# Patient Record
Sex: Female | Born: 1955 | Hispanic: No | Marital: Married | Smoking: Never smoker
Health system: Southern US, Community
[De-identification: ages and names within clinical notes are randomized; demographics above are authoritative.]

## PROBLEM LIST (undated history)

## (undated) DIAGNOSIS — G4700 Insomnia, unspecified: Secondary | ICD-10-CM

## (undated) DIAGNOSIS — R05 Cough: Secondary | ICD-10-CM

## (undated) DIAGNOSIS — Z8619 Personal history of other infectious and parasitic diseases: Secondary | ICD-10-CM

## (undated) DIAGNOSIS — T7840XA Allergy, unspecified, initial encounter: Secondary | ICD-10-CM

## (undated) DIAGNOSIS — E785 Hyperlipidemia, unspecified: Secondary | ICD-10-CM

## (undated) DIAGNOSIS — M549 Dorsalgia, unspecified: Secondary | ICD-10-CM

## (undated) DIAGNOSIS — K089 Disorder of teeth and supporting structures, unspecified: Secondary | ICD-10-CM

## (undated) DIAGNOSIS — I10 Essential (primary) hypertension: Secondary | ICD-10-CM

## (undated) DIAGNOSIS — J45909 Unspecified asthma, uncomplicated: Secondary | ICD-10-CM

## (undated) DIAGNOSIS — Z6831 Body mass index (BMI) 31.0-31.9, adult: Secondary | ICD-10-CM

## (undated) DIAGNOSIS — J0100 Acute maxillary sinusitis, unspecified: Secondary | ICD-10-CM

## (undated) DIAGNOSIS — E669 Obesity, unspecified: Secondary | ICD-10-CM

## (undated) DIAGNOSIS — M542 Cervicalgia: Secondary | ICD-10-CM

## (undated) DIAGNOSIS — K219 Gastro-esophageal reflux disease without esophagitis: Secondary | ICD-10-CM

## (undated) DIAGNOSIS — F411 Generalized anxiety disorder: Secondary | ICD-10-CM

## (undated) DIAGNOSIS — J309 Allergic rhinitis, unspecified: Secondary | ICD-10-CM

## (undated) DIAGNOSIS — N8189 Other female genital prolapse: Secondary | ICD-10-CM

## (undated) DIAGNOSIS — Z87898 Personal history of other specified conditions: Secondary | ICD-10-CM

## (undated) HISTORY — DX: Dorsalgia, unspecified: M54.9

## (undated) HISTORY — DX: Personal history of other specified conditions: Z87.898

## (undated) HISTORY — DX: Obesity, unspecified: E66.9

## (undated) HISTORY — DX: Acute maxillary sinusitis, unspecified: J01.00

## (undated) HISTORY — DX: Allergic rhinitis, unspecified: J30.9

## (undated) HISTORY — PX: TUBAL LIGATION: SHX77

## (undated) HISTORY — DX: Insomnia, unspecified: G47.00

## (undated) HISTORY — DX: Unspecified asthma, uncomplicated: J45.909

## (undated) HISTORY — DX: Other female genital prolapse: N81.89

## (undated) HISTORY — DX: Allergy, unspecified, initial encounter: T78.40XA

## (undated) HISTORY — DX: Personal history of other infectious and parasitic diseases: Z86.19

## (undated) HISTORY — DX: Essential (primary) hypertension: I10

## (undated) HISTORY — DX: Gastro-esophageal reflux disease without esophagitis: K21.9

## (undated) HISTORY — DX: Hyperlipidemia, unspecified: E78.5

## (undated) HISTORY — DX: Body mass index (bmi) 31.0-31.9, adult: Z68.31

## (undated) HISTORY — DX: Cough: R05

## (undated) HISTORY — PX: REPLACEMENT TOTAL KNEE: SUR1224

## (undated) HISTORY — DX: Cervicalgia: M54.2

## (undated) HISTORY — PX: VAGINAL HYSTERECTOMY: SUR661

## (undated) HISTORY — DX: Disorder of teeth and supporting structures, unspecified: K08.9

## (undated) HISTORY — DX: Generalized anxiety disorder: F41.1

---

## 1997-11-19 ENCOUNTER — Other Ambulatory Visit: Admission: RE | Admit: 1997-11-19 | Discharge: 1997-11-19 | Payer: Self-pay | Admitting: Obstetrics and Gynecology

## 1999-03-05 ENCOUNTER — Encounter: Payer: Self-pay | Admitting: Obstetrics and Gynecology

## 1999-03-05 ENCOUNTER — Ambulatory Visit (HOSPITAL_COMMUNITY): Admission: RE | Admit: 1999-03-05 | Discharge: 1999-03-05 | Payer: Self-pay | Admitting: Obstetrics and Gynecology

## 1999-04-01 ENCOUNTER — Other Ambulatory Visit: Admission: RE | Admit: 1999-04-01 | Discharge: 1999-04-01 | Payer: Self-pay | Admitting: Obstetrics and Gynecology

## 1999-05-21 ENCOUNTER — Encounter (INDEPENDENT_AMBULATORY_CARE_PROVIDER_SITE_OTHER): Payer: Self-pay | Admitting: Specialist

## 1999-05-22 ENCOUNTER — Inpatient Hospital Stay (HOSPITAL_COMMUNITY): Admission: AD | Admit: 1999-05-22 | Discharge: 1999-05-23 | Payer: Self-pay | Admitting: Obstetrics and Gynecology

## 2001-03-15 ENCOUNTER — Ambulatory Visit (HOSPITAL_COMMUNITY): Admission: RE | Admit: 2001-03-15 | Discharge: 2001-03-15 | Payer: Self-pay | Admitting: Obstetrics and Gynecology

## 2001-03-15 ENCOUNTER — Encounter: Payer: Self-pay | Admitting: Obstetrics and Gynecology

## 2002-05-09 ENCOUNTER — Encounter: Payer: Self-pay | Admitting: Obstetrics and Gynecology

## 2002-05-09 ENCOUNTER — Ambulatory Visit (HOSPITAL_COMMUNITY): Admission: RE | Admit: 2002-05-09 | Discharge: 2002-05-09 | Payer: Self-pay | Admitting: Obstetrics and Gynecology

## 2002-05-17 ENCOUNTER — Other Ambulatory Visit: Admission: RE | Admit: 2002-05-17 | Discharge: 2002-05-17 | Payer: Self-pay | Admitting: Obstetrics and Gynecology

## 2005-04-07 ENCOUNTER — Other Ambulatory Visit: Admission: RE | Admit: 2005-04-07 | Discharge: 2005-04-07 | Payer: Self-pay | Admitting: Obstetrics and Gynecology

## 2006-05-31 ENCOUNTER — Ambulatory Visit: Payer: Self-pay | Admitting: Internal Medicine

## 2006-06-27 ENCOUNTER — Ambulatory Visit: Payer: Self-pay | Admitting: Internal Medicine

## 2007-10-24 ENCOUNTER — Ambulatory Visit (HOSPITAL_COMMUNITY): Admission: RE | Admit: 2007-10-24 | Discharge: 2007-10-24 | Payer: Self-pay | Admitting: Obstetrics and Gynecology

## 2009-04-15 ENCOUNTER — Ambulatory Visit (HOSPITAL_COMMUNITY): Admission: RE | Admit: 2009-04-15 | Discharge: 2009-04-15 | Payer: Self-pay | Admitting: Obstetrics and Gynecology

## 2009-11-17 ENCOUNTER — Ambulatory Visit (HOSPITAL_COMMUNITY): Admission: RE | Admit: 2009-11-17 | Discharge: 2009-11-17 | Payer: Self-pay | Admitting: Gastroenterology

## 2010-03-08 ENCOUNTER — Encounter: Payer: Self-pay | Admitting: Internal Medicine

## 2010-05-18 ENCOUNTER — Other Ambulatory Visit: Payer: Self-pay | Admitting: Internal Medicine

## 2010-05-18 DIAGNOSIS — R519 Headache, unspecified: Secondary | ICD-10-CM

## 2010-05-27 ENCOUNTER — Ambulatory Visit
Admission: RE | Admit: 2010-05-27 | Discharge: 2010-05-27 | Disposition: A | Discharge status: Home / Self Care | Payer: Federal, State, Local not specified - PPO | Source: Ambulatory Visit | Attending: Internal Medicine | Admitting: Internal Medicine

## 2010-05-27 DIAGNOSIS — R519 Headache, unspecified: Secondary | ICD-10-CM

## 2010-06-03 ENCOUNTER — Other Ambulatory Visit: Payer: Self-pay | Admitting: Internal Medicine

## 2010-06-03 DIAGNOSIS — I6521 Occlusion and stenosis of right carotid artery: Secondary | ICD-10-CM

## 2010-06-03 DIAGNOSIS — R51 Headache: Secondary | ICD-10-CM

## 2010-06-04 ENCOUNTER — Ambulatory Visit
Admission: RE | Admit: 2010-06-04 | Discharge: 2010-06-04 | Disposition: A | Discharge status: Home / Self Care | Payer: Federal, State, Local not specified - PPO | Source: Ambulatory Visit | Attending: Internal Medicine | Admitting: Internal Medicine

## 2010-06-04 DIAGNOSIS — I6521 Occlusion and stenosis of right carotid artery: Secondary | ICD-10-CM

## 2010-07-03 NOTE — Discharge Summary (Signed)
[facility]  Patient:    Brooke Wilson, Brooke Wilson                   MRN: [id number] Adm. Date:  [id number] Disc. Date: 05/22/99 Attending:  Shaune Spittle Dictator:   Henreitta Leber, P.A.                           Discharge Summary  DISCHARGE DIAGNOSES:          1. Menorrhagia.                               2. Urinary stress incontinence.                               3. Pelvic pain.  PROCEDURE:                    On date of admission the patient underwent a total vaginal hysterectomy with posterior repair along with excision of a left medial  thigh lesion and excision of two hyperpigmented perineal lesions, and a left ovarian cystectomy.  HISTORY OF PRESENT ILLNESS:   This is a 55 year old married female, gravida 4, ara 3-0-1-3, who is S/P BTL with a history of hypertension admitted for total vaginal hysterectomy and posterior repair due to a longstanding history of menorrhagia,  urinary stress incontinence, and chronic constipation.  Please see the patients  dictated history and physical examination for details.  PHYSICAL EXAMINATION:         VITAL SIGNS: Blood pressure 120/80, weight 164, height 4 feet 11-1/2 inches tall.  GENERAL: Within normal limits.  PELVIC: EGBUS within normal limits.  PELVIC: Cervix revealed second degree descensus.  Vagina was rugous.  The uterus was enlarged and difficult to assess secondary to body habitus. Adnexa was without tenderness or masses.  Rectovaginal was without tenderness or masses.  HOSPITAL COURSE:              On date of admission, the patient underwent a total vaginal hysterectomy with a posterior repair along with excision of perineal and medial thigh lesions tolerating all procedures well.  Vaginal packing was placed postoperatively and removed on postoperative day #1, however, the patient experienced bleeding in which she soaked a pad in two hours at which time packing was replaced.   Otherwise, postoperative course was uneventful.  Postoperative hemoglobin was 11.6 (preop hemoglobin 13.1).  Having received the maximum benefit of hospital stay, the patient was discharged to home on postoperative day #1.  DISCHARGE MEDICATIONS:        1. Ibuprofen 800 mg one tablet every six hours with                                  food for pain.                               2. Phenergan 25 mg one tablet every six hours as                                  needed for pain.  3. Ferrous sulfate 325 mg one tablet twice daily                                  for six weeks.                               4. Tylox one to two tablets every four to six                                  hours as needed for pain.                               5. Zyrtec 10 mg one tablet daily.                               6. Flonase two puffs twice daily.                               7. Flovent two puffs twice daily.                               8. Albuterol inhaler two puffs four times daily                                  as needed.                               9. Norvasc 5 mg one tablet daily.  DISCHARGE INSTRUCTIONS:       The patient was given a copy of [facility] postoperative instruction sheet.  She was also encouraged to call for questions or concerns.  The patient further was advised to abstain rom driving for two weeks, heavy lifting for four weeks, and intercourse for six weeks.  FOLLOW-UP:                    The patient is to return to [facility] on May 23, 1999, at 9 a.m. for vaginal packing removal.  She is to call [facility] for a six weeks postoperative examination with Erie Noe P. Pennie Rushing, M.D.  The patients final pathology was not  available at the time of discharge. DD:  05/22/99 TD:  05/22/99 Job: [id number]

## 2010-07-03 NOTE — Op Note (Signed)
[facility]  Patient:    Brooke Wilson, Brooke Wilson                   MRN: [id number] Proc. Date: 05/21/99 Adm. Date:  [id number] Attending:  Dierdre Forth Pearline                           Operative Report  PREOPERATIVE DIAGNOSES:       Menometrorrhagia, pelvic relaxation, left inner thigh lesion.  POSTOPERATIVE DIAGNOSES:      Menometrorrhagia, pelvic relaxation, left inner thigh lesion plus pigmented vulvar lesions, left ovarian cyst ruptured during procedure.  OPERATION:                    Total vaginal hysterectomy, excision of left inner thigh lesion, posterior repair, excision of pigmented lesions of the vulva, excision of left ovarian cyst wall.  ANESTHESIA:                   General orotracheal.  ESTIMATED BLOOD LOSS:         150 cc.  COMPLICATIONS:                None.  SURGEON:                      Vanessa P. Pennie Rushing, M.D.  FIRST ASSISTANT:              Henreitta Leber, P.A.  FINDINGS:                     The uterus was upper limits of normal size.  The right ovary was within normal limits.  The left ovary contained a 3-cm cyst which ruptured during the time of hysterectomy and the cyst wall was excised and oversewn.  There were two 2-mm pigmented vulvar lesions on the right posterior fourchette of the vulva and there was a 5-mm left inner thigh subcutaneous lesion consistent with a lipoma.  DESCRIPTION OF PROCEDURE:     The patient was taken to the operating room after  appropriate identification and placed on the operating table.  After the attainment of adequate general anesthesia, she was placed in the lithotomy position.  The perineum and vagina were prepped with multiple layers of Betadine and a Foley catheter inserted into the bladder under sterile conditions and connected to straight drainage.  The perineum was draped as a sterile field.  The left inner  thigh lesion was infiltrated with approximately 3 cc of 0.25%  Marcaine and an elliptical incision made surrounding the lesion.  It was then sharply dissected  from the underlying subcutaneous tissue and removed from the operative field. he defect was closed with deep mattress sutures of 3-0 Vicryl and a subcuticular suture of 3-0 Vicryl was placed.  Steri-Strips were placed over the incision to  maintain the approximation of the incisional edges.  A weighted speculum was then placed in the posterior vagina and Lahey tenacula placed on the anterior and posterior lips of the cervix.  The cervicovaginal mucosa was injected with a dilute solution of Pitressin and the cervicovaginal junction incised circumferentially. The anterior vaginal mucosa was dissected off the anterior cervix and the anterior peritoneum entered.  The posterior peritoneum was entered and held.  The uterosacral ligaments on the right and left sides were clamped, cut and suture-ligated and those sutures held.  The paracervical tissues were then clamped, cut and suture-ligated.  The uterus was inverted and the upper pedicles including the upper broad ligament, tube and uteroovarian ligaments were clamped and cut nd the uterus removed from the operative field.  These were then secured with free  ties, then suture ligatures and held.  Hemostatic sutures were placed in the angles of the vagina and hemostasis noted to be adequate.  A McCall culdoplasty suture was then placed in the posterior cul-de-sac, incorporating the uterosacral ligaments and the intervening peritoneum.  Two of these sutures were placed and then held. The vaginal angles were created with the uterosacral ligament sutures which had  been held on the anterior and posterior vaginal surfaces and tied down.  The remainder of the vaginal cuff was closed with figure-of-eight sutures which incorporated the anterior vaginal mucosa, anterior peritoneum, posterior peritoneum and posterior vaginal mucosa.  Once these  had been tied down, hemostasis was noted to be adequate and the [facility] culdoplasty sutures were tied down.  The weighted  speculum was then removed and the posterior repair begun by incising the peritoneum and excising the aforementioned pigmented vulvar lesions on the right side of the posterior perineum.  The posterior vaginal mucosa was incised in the midline and the vaginal mucosa dissected off the underlying fascia.  Once this had been achieved, the levators were incorporated in mattress sutures of 0 chromic to reapproximate them in the midline as these sutures were tied down.  The redundant vaginal mucosa was excised and the vaginal mucosa closed with a running interlocking suture, which at the perineum incorporated the perineal body.  A subcuticular suture of 2-0 chromic was used to close the skin on the perineum and hemostasis was noted to be adequate.  A two-inch packing with Estrace cream was  then placed in the vagina, once hemostasis was noted to be adequate and all instruments had been removed from the vagina.  The patient was then awakened from general anesthesia and taken to the recovery room in satisfactory condition, having tolerated the procedure well, with sponge and instrument counts correct.  SPECIMENS TO PATHOLOGY:       1. Uterus and cervix.                               2. Left ovarian cyst wall.                               3. Left inner thigh lesion.                               4. Right perineal pigmented lesion. DD:  05/21/99 TD:  05/21/99 Job: [id number]

## 2011-03-22 ENCOUNTER — Ambulatory Visit (INDEPENDENT_AMBULATORY_CARE_PROVIDER_SITE_OTHER): Payer: Federal, State, Local not specified - PPO | Admitting: Internal Medicine

## 2011-03-22 VITALS — BP 118/77 | HR 90 | Temp 97.9°F | Resp 18 | Ht 59.0 in | Wt 154.0 lb

## 2011-03-22 DIAGNOSIS — E785 Hyperlipidemia, unspecified: Secondary | ICD-10-CM

## 2011-03-22 DIAGNOSIS — I10 Essential (primary) hypertension: Secondary | ICD-10-CM

## 2011-03-22 DIAGNOSIS — R05 Cough: Secondary | ICD-10-CM

## 2011-03-22 DIAGNOSIS — Z6831 Body mass index (BMI) 31.0-31.9, adult: Secondary | ICD-10-CM

## 2011-03-22 DIAGNOSIS — R059 Cough, unspecified: Secondary | ICD-10-CM

## 2011-03-22 MED ORDER — NIFEDIPINE ER OSMOTIC RELEASE 30 MG PO TB24: 30.0000 mg | ORAL_TABLET | Freq: Every day | ORAL | Status: DC

## 2011-03-22 MED ORDER — HYDROCHLOROTHIAZIDE 25 MG PO TABS: 25.0000 mg | ORAL_TABLET | Freq: Every day | ORAL | Status: DC

## 2011-03-23 DIAGNOSIS — E785 Hyperlipidemia, unspecified: Secondary | ICD-10-CM | POA: Insufficient documentation

## 2011-03-23 DIAGNOSIS — Z6831 Body mass index (BMI) 31.0-31.9, adult: Secondary | ICD-10-CM | POA: Insufficient documentation

## 2011-03-23 DIAGNOSIS — I10 Essential (primary) hypertension: Secondary | ICD-10-CM

## 2011-03-23 DIAGNOSIS — R059 Cough, unspecified: Secondary | ICD-10-CM | POA: Insufficient documentation

## 2011-03-23 DIAGNOSIS — R05 Cough: Secondary | ICD-10-CM | POA: Insufficient documentation

## 2011-03-23 DIAGNOSIS — I1A0 Resistant hypertension: Secondary | ICD-10-CM | POA: Insufficient documentation

## 2011-03-23 DIAGNOSIS — T7840XA Allergy, unspecified, initial encounter: Secondary | ICD-10-CM | POA: Insufficient documentation

## 2011-03-23 HISTORY — DX: Cough, unspecified: R05.9

## 2011-03-23 HISTORY — DX: Body mass index (BMI) 31.0-31.9, adult: Z68.31

## 2011-03-23 HISTORY — DX: Resistant hypertension: I1A.0

## 2011-03-23 HISTORY — DX: Hyperlipidemia, unspecified: E78.5

## 2011-03-23 HISTORY — DX: Essential (primary) hypertension: I10

## 2011-03-23 NOTE — Progress Notes (Signed)
  Subjective:    Patient ID: Brooke Wilson, female    DOB: 1955-11-16, 56 y.o.   MRN: [id number]  HPIbelieving her Tenormin or rather Tenoretic to be the cause of her chronic cough, she discontinued this medicine one and a half weeks ago and feels better on ready. She had a cough response to lisinopril. She also has frequent allergies and perhaps some reflux and often has a cough anyway. Past workup has been inconclusive. She has essential hypertension but being overweight contributes to this.  She is currently not experiencing any problems with the Crestor.  She relates several questions about taking vitamin D. She does poorly with daily medication and would prefer to take 50,000 units once a week, however her vitamin D level is almost normal and this would be excessive. I reminded her that her DEXA scan was normal 4 years ago and that" current guidelines she doesn't need extra vitamin D    Review of Systemsnoncontributory today     Objective:   Physical ExamHEENT within normal limits  Cardiovascular within normal limits Lungs are clear There is no peripheral edema        Assessment & Plan:  Problem #1 chronic cough  Problem #2 hypertension  Problem #3 hyperlipidemia   According to her wishes we will replace Tenoretic with nifedipine and hydrochlorothiazide. She will monitor home blood pressures and followup in April 4 physical examination with lab work including remeasurement of her vitamin D.

## 2011-05-17 ENCOUNTER — Ambulatory Visit (INDEPENDENT_AMBULATORY_CARE_PROVIDER_SITE_OTHER): Payer: Federal, State, Local not specified - PPO | Admitting: Internal Medicine

## 2011-05-17 VITALS — BP 107/68 | HR 91 | Temp 98.1°F | Resp 16 | Ht 59.5 in | Wt 148.0 lb

## 2011-05-17 DIAGNOSIS — G4700 Insomnia, unspecified: Secondary | ICD-10-CM | POA: Insufficient documentation

## 2011-05-17 DIAGNOSIS — Z0000 Encounter for general adult medical examination without abnormal findings: Secondary | ICD-10-CM

## 2011-05-17 DIAGNOSIS — F411 Generalized anxiety disorder: Secondary | ICD-10-CM

## 2011-05-17 DIAGNOSIS — K219 Gastro-esophageal reflux disease without esophagitis: Secondary | ICD-10-CM

## 2011-05-17 DIAGNOSIS — I10 Essential (primary) hypertension: Secondary | ICD-10-CM

## 2011-05-17 DIAGNOSIS — Z6831 Body mass index (BMI) 31.0-31.9, adult: Secondary | ICD-10-CM

## 2011-05-17 DIAGNOSIS — E785 Hyperlipidemia, unspecified: Secondary | ICD-10-CM

## 2011-05-17 HISTORY — DX: Generalized anxiety disorder: F41.1

## 2011-05-17 HISTORY — DX: Gastro-esophageal reflux disease without esophagitis: K21.9

## 2011-05-17 HISTORY — DX: Insomnia, unspecified: G47.00

## 2011-05-17 LAB — LIPID PANEL
HDL: 49 mg/dL (ref >39)
LDL Cholesterol: 63 mg/dL (ref 0–99)
Total CHOL/HDL Ratio: 2.9 Ratio
Triglycerides: 153 mg/dL — ABNORMAL HIGH (ref <150)
VLDL: 31 mg/dL (ref 0–40)

## 2011-05-17 LAB — POCT CBC
HCT, POC: 38.3 % (ref 37.7–47.9)
Lymph, poc: 2 (ref 0.6–3.4)
MCHC: 31.3 g/dL — AB (ref 31.8–35.4)
POC Granulocyte: 4.3 (ref 2–6.9)
POC LYMPH PERCENT: 30.3 %L (ref 10–50)
POC MID %: 5.5 %M (ref 0–12)
RDW, POC: 14.3 %

## 2011-05-17 LAB — COMPREHENSIVE METABOLIC PANEL
Alkaline Phosphatase: 96 U/L (ref 39–117)
Glucose, Bld: 113 mg/dL — ABNORMAL HIGH (ref 70–99)
Sodium: 141 mEq/L (ref 135–145)
Total Bilirubin: 0.3 mg/dL (ref 0.3–1.2)
Total Protein: 7.2 g/dL (ref 6.0–8.3)

## 2011-05-17 LAB — POCT URINALYSIS DIPSTICK
Ketones, UA: NEGATIVE
Protein, UA: NEGATIVE
Spec Grav, UA: 1.015
pH, UA: 6

## 2011-05-17 MED ORDER — DIAZEPAM 10 MG PO TABS: 10.0000 mg | ORAL_TABLET | Freq: Three times a day (TID) | ORAL | Status: AC | PRN

## 2011-05-17 MED ORDER — DILTIAZEM HCL ER 120 MG PO CP24: 120.0000 mg | ORAL_CAPSULE | Freq: Every day | ORAL | Status: DC

## 2011-05-17 MED ORDER — TEMAZEPAM 30 MG PO CAPS: 30.0000 mg | ORAL_CAPSULE | Freq: Every evening | ORAL | Status: DC | PRN

## 2011-05-17 MED ORDER — DILTIAZEM HCL ER 120 MG PO CP12: 120.0000 mg | ORAL_CAPSULE | Freq: Two times a day (BID) | ORAL | Status: DC

## 2011-05-17 NOTE — Progress Notes (Signed)
Subjective:    Patient ID: Brooke Wilson, female    DOB: 1955-04-09, 56 y.o.   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 Essential hypertension, benign  . Cough  . Hyperlipemia  . BMI 31.0-31.9,adult  . GERD (gastroesophageal reflux disease)  . Anxiety state, unspecified  . Insomnia, unspecified  Her problems are all stable at this point She blames nifedipine for causing a cough/she uses this when necessary for palpitations and possibly for hypertension though it is unclear that she takes it on a regular basis/she would like to switch to Cardiazem Although she has in the past been on inhalers she says that she has no asthma She has been taking 50,000 units of vitamin D weekly and this will be rechecked She continues to need temazepam for sleep and when necessary Valium for anxiety She has not been able to lose any weight  Her colonoscopy with Dr. Loreta Ave in 2011 was normal Dr. Pennie Rushing is her gynecologist She had been advised to take Crestor for hyperlipidemia/we will recheck this as she is no longer taking this medicine She uses DEXA land on a when necessary basis      Review of Systems  Constitutional: Negative.   HENT: Negative.   Eyes: Negative.   Respiratory: Negative.   Cardiovascular: Negative.   Gastrointestinal: Negative.   Genitourinary: Negative.   Musculoskeletal: Negative.   Skin: Negative.   Neurological: Negative.   Hematological: Negative.   Psychiatric/Behavioral: Negative for suicidal ideas, behavioral problems, confusion, dysphoric mood and agitation.       Objective:   Physical Exam  Constitutional: She is oriented  to person, place, and time. She appears well-developed and well-nourished.  HENT:  Head: Normocephalic.  Right Ear: External ear normal.  Left Ear: External ear normal.  Nose: Nose normal.  Eyes: Conjunctivae and EOM are normal. Pupils are equal, round, and reactive to light.  Neck: Normal range of motion. Neck supple. No thyromegaly present.  Cardiovascular: Regular rhythm, normal heart sounds and intact distal pulses.        Note that she had a carotid Doppler study in 2012 that identified no significant stenosis  Pulmonary/Chest: Effort normal and breath sounds normal.  Abdominal: Soft. Bowel sounds are normal.  Musculoskeletal: Normal range of motion. She exhibits no edema.  Neurological: She is alert and oriented to person, place, and time. She has normal reflexes.  Skin: Skin is warm and dry. No rash noted.  Psychiatric: She has a normal mood and affect. Her behavior is normal. Judgment and thought content normal.      Results for orders placed in visit on 05/17/11  POCT CBC      Component Value Range   WBC 6.7  4.6 - 10.2 (K/uL)   Lymph, poc 2.0  0.6 - 3.4    POC LYMPH PERCENT 30.3  10 - 50 (%L)   MID (cbc) 0.4  0 - 0.9    POC MID % 5.5  0 - 12 (%M)   POC Granulocyte 4.3  2 - 6.9    Granulocyte percent 64.2  37 - 80 (%G)   RBC 4.50  4.04 - 5.48 (M/uL)   Hemoglobin 12.0 (*)  12.2 - 16.2 (g/dL)   HCT, POC 40.9  81.1 - 47.9 (%)   MCV 85.2  80 - 97 (fL)   MCH, POC 26.7 (*) 27 - 31.2 (pg)   MCHC 31.3 (*) 31.8 - 35.4 (g/dL)   RDW, POC 91.4     Platelet Count, POC 814 (*) 142 - 424 (K/uL)   MPV 8.6  0 - 99.8 (fL)  POCT GLYCOSYLATED HEMOGLOBIN (HGB A1C)      Component Value Range   Hemoglobin A1C 5.7    POCT URINALYSIS DIPSTICK      Component Value Range   Color, UA YELLOW     Clarity, UA CLEAR     Glucose, UA NEG     Bilirubin, UA NEG     Ketones, UA NEG     Spec Grav, UA 1.015     Blood, UA MOD     pH, UA 6.0     Protein, UA NEG     Urobilinogen, UA 0.2      Nitrite, UA NEG     Leukocytes, UA Negative         Assessment & Plan:  Problem #1 annual physical examination Patient Active Problem List  Diagnoses  . Essential hypertension, benign  . Cough  . Hyperlipemia  . BMI 31.0-31.9,adult  . GERD (gastroesophageal reflux disease)  . Anxiety state, unspecified  . Insomnia, unspecified   Six-month refills for Valium and temazepam Change to Diltia is 120 Hydrochlorothiazide was refilled in February for one year I will contact her with her team labs

## 2011-05-22 ENCOUNTER — Encounter: Payer: Self-pay | Admitting: Internal Medicine

## 2011-06-05 ENCOUNTER — Telehealth: Payer: Self-pay

## 2011-06-05 NOTE — Telephone Encounter (Signed)
Called in RXs from the last OV with [facility] [address]. Called pt no to let her know vm not setup

## 2011-06-05 NOTE — Telephone Encounter (Signed)
Called again to notify pt, no answer

## 2011-06-05 NOTE — Telephone Encounter (Signed)
PT WAS SEEN THE FIRST OF April BY DR DOOLITTLE. STATES [facility] [address], WENT TO PICK UP AND NOTHING SENT PLEASE CALL PT TO ADVISE

## 2011-06-06 NOTE — Telephone Encounter (Signed)
Called pt phone answered but then hung up

## 2011-06-08 ENCOUNTER — Other Ambulatory Visit: Payer: Self-pay

## 2011-06-12 ENCOUNTER — Telehealth: Payer: Self-pay | Admitting: Family Medicine

## 2011-06-12 ENCOUNTER — Other Ambulatory Visit: Payer: Self-pay | Admitting: Family Medicine

## 2011-06-12 MED ORDER — ROSUVASTATIN CALCIUM 20 MG PO TABS: 20.0000 mg | ORAL_TABLET | Freq: Every day | ORAL | Status: DC

## 2011-06-12 NOTE — Telephone Encounter (Signed)
Rx changed to #90 1 refill due to patient request/insurance.

## 2011-08-17 ENCOUNTER — Telehealth: Payer: Self-pay | Admitting: Obstetrics and Gynecology

## 2011-08-25 ENCOUNTER — Other Ambulatory Visit: Payer: Self-pay | Admitting: Obstetrics and Gynecology

## 2011-08-25 DIAGNOSIS — Z1231 Encounter for screening mammogram for malignant neoplasm of breast: Secondary | ICD-10-CM

## 2011-09-21 ENCOUNTER — Ambulatory Visit (INDEPENDENT_AMBULATORY_CARE_PROVIDER_SITE_OTHER): Payer: Federal, State, Local not specified - PPO | Admitting: Obstetrics and Gynecology

## 2011-09-21 ENCOUNTER — Encounter: Payer: Self-pay | Admitting: Obstetrics and Gynecology

## 2011-09-21 VITALS — Ht 59.0 in | Wt 152.0 lb

## 2011-09-21 DIAGNOSIS — N9089 Other specified noninflammatory disorders of vulva and perineum: Secondary | ICD-10-CM

## 2011-09-21 DIAGNOSIS — Z124 Encounter for screening for malignant neoplasm of cervix: Secondary | ICD-10-CM

## 2011-09-21 NOTE — Progress Notes (Signed)
AEX( last visit 2009)  Last Pap: 2012 WNL: Yes wnl per pt (?facility) Regular Periods:no Contraception: hyst  Monthly Breast exam:yes Tetanus<44yrs:yes Nl.Bladder Function:yes Daily BMs:yes Healthy Diet:no Calcium:no Mammogram:yes Date of Mammogram: 09/18/2011 @[facility] -unknown results Exercise:no Have often Exercise:n/a Seatbelt: yes Abuse at home: no Stressful work:no Sigmoid-colonoscopy: 2012-wnl per pt Dr.Mann Bone Density: Yes 37yrs ago-wnl per pt PCP: Dr.Reade Dr. Merla Riches Change in PMH: no change  Change in FMH:no change Subjective:    Zykerria Tanton is a 56 y.o. female [phone] who presents for annual exam.  The patient has no complaints today.   The following portions of the patient's history were reviewed and updated as appropriate: allergies, current medications, past family history, past medical history, past social history, past surgical history and problem list.  Review of Systems Pertinent items are noted in HPI. Gastrointestinal:No change in bowel habits, no abdominal pain, no rectal bleeding Genitourinary:negative for dysuria, frequency, hematuria, nocturia and urinary incontinence    Objective:     Ht 4\' 11"  (1.499 m)  Wt 152 lb (68.947 kg)  BMI 30.70 kg/m2  Weight:  Wt Readings from Last 1 Encounters:  09/21/11 152 lb (68.947 kg)     BMI: Body mass index is 30.70 kg/(m^2). General Appearance: Alert, appropriate appearance for age. No acute distress HEENT: Grossly normal Neck / Thyroid: Supple, no masses, nodes or enlargement Lungs: clear to auscultation bilaterally Back: No CVA tenderness Breast Exam: No masses or nodes.No dimpling, nipple retraction or discharge. Cardiovascular: Regular rate and rhythm. S1, S2, no murmur Gastrointestinal: Soft, non-tender, no masses or organomegaly Pelvic Exam: External genitalia: multiple hyperpimented areas.  On right labia, lesions are raised. At posterior fourchette they are fflat Vaginal: normal mucosa  without prolapse or lesions and vaginal vault, well healed and suspended Cervix: removed surgically Adnexa: non palpable Uterus: removed surgically Rectovaginal: normal rectal, no masses Lymphatic Exam: Non-palpable nodes in neck, clavicular, axillary, or inguinal regions Skin: no rash or abnormalities Neurologic: Normal gait and speech, no tremor  Psychiatric: Alert and oriented, appropriate affect.    Urinalysis:Not done      Assessment:    Pigmented vulvar lesion    Plan:    Biopsy recommended and accepted   Follow-up:  For bx  .

## 2011-09-23 ENCOUNTER — Encounter: Payer: Self-pay | Admitting: Obstetrics and Gynecology

## 2011-09-24 ENCOUNTER — Encounter: Payer: Self-pay | Admitting: Obstetrics and Gynecology

## 2011-09-24 ENCOUNTER — Ambulatory Visit (INDEPENDENT_AMBULATORY_CARE_PROVIDER_SITE_OTHER): Payer: Federal, State, Local not specified - PPO | Admitting: Obstetrics and Gynecology

## 2011-09-24 VITALS — BP 118/80 | Resp 14 | Ht 59.0 in | Wt 149.0 lb

## 2011-09-24 DIAGNOSIS — N9089 Other specified noninflammatory disorders of vulva and perineum: Secondary | ICD-10-CM

## 2011-09-24 NOTE — Progress Notes (Signed)
Patient ID: Brooke Wilson, female   DOB: 1955/08/31, 56 y.o.   MRN: [id number] Pt is here today for vulvar biopsy. Consent signed and witnessed per protocol. JO  BP 118/80  Resp 14  Ht 4\' 11"  (1.499 m)  Wt 149 lb (67.586 kg)  BMI 30.09 kg/m2  Procedure: Posterior fourchette cleaned with Betadine. Infiltrated with 1cc of 1% lidocaine Excisional biopsy of pigmented posterior fourchette lesion of 3 mm by shaving. Silver nitrate to base of lesion with good hemostasis  Assessment: Pigmented vulvar lesion with sites surrounding vulva.  Recommendation: OTC triple antibiotic ointment to bx site Dilutional squirt bottle to be used during urination Will notify pt of pathology  By phone with followup determined at that time

## 2011-09-29 ENCOUNTER — Telehealth: Payer: Self-pay

## 2011-09-29 NOTE — Telephone Encounter (Signed)
Tc from pt per telephone call. Told pt no evidence of any cancer, just inflammation found on vulvar biopsy results. No treatment required. Pt voices understanding.

## 2011-09-29 NOTE — Telephone Encounter (Signed)
Tc to pt per test results. No answer and unable to leave a vm.

## 2011-10-07 ENCOUNTER — Encounter: Payer: Federal, State, Local not specified - PPO | Admitting: Obstetrics and Gynecology

## 2012-01-04 ENCOUNTER — Telehealth: Payer: Self-pay

## 2012-01-04 NOTE — Telephone Encounter (Signed)
Request given to xray 

## 2012-01-04 NOTE — Telephone Encounter (Signed)
PT WOULD LIKE TO COME BY AND P/U A COPY OF HER XRAY. PLEASE CALL [phone] WHEN READY

## 2012-01-09 ENCOUNTER — Other Ambulatory Visit: Payer: Self-pay | Admitting: Internal Medicine

## 2012-01-11 NOTE — Telephone Encounter (Signed)
Pt wants to have latest chest x-ray reviewed and wants to know if there are any signs of tuberculosis.   [phone]

## 2012-01-14 NOTE — Progress Notes (Signed)
Completed prior auth over the phone for pt's temazepam 30 mg and received approval through 01/13/13. Faxed approval notice to pharmacy.

## 2012-01-15 ENCOUNTER — Telehealth: Payer: Self-pay | Admitting: *Deleted

## 2012-01-15 NOTE — Telephone Encounter (Signed)
Buena Irish 01/11/2012 12:54 PM Signed  Pt wants to have latest chest x-ray reviewed and wants to know if there are any signs of tuberculosis.  [phone]

## 2012-01-16 NOTE — Telephone Encounter (Signed)
Note was in rx requests.    No answer/no vm. Need to get more information

## 2012-01-16 NOTE — Telephone Encounter (Signed)
Patient would like a refill on valium and restoril.

## 2012-01-16 NOTE — Telephone Encounter (Signed)
We will need her chart to see her last chest xray- I would just get her to pick up a copy - my guess is she needs to be screened for TB for work.  Once you get a copy of her CXR up front please route to Dr. Merla Riches for him to review her med request.

## 2012-01-16 NOTE — Telephone Encounter (Signed)
Patient would like a refill on her Valium and restoril.  Pharmacy should have sent requests.

## 2012-02-11 ENCOUNTER — Telehealth: Payer: Self-pay

## 2012-02-11 MED ORDER — ALBUTEROL SULFATE HFA 108 (90 BASE) MCG/ACT IN AERS: 2.0000 | INHALATION_SPRAY | Freq: Four times a day (QID) | RESPIRATORY_TRACT | Status: DC | PRN

## 2012-02-11 MED ORDER — FLUTICASONE PROPIONATE 50 MCG/ACT NA SUSP: 2.0000 | Freq: Every day | NASAL | Status: DC

## 2012-02-11 MED ORDER — FLUTICASONE-SALMETEROL 250-50 MCG/DOSE IN AEPB: 1.0000 | INHALATION_SPRAY | Freq: Two times a day (BID) | RESPIRATORY_TRACT | Status: DC

## 2012-02-11 NOTE — Telephone Encounter (Signed)
Rx's sent.  Need office visit for additional refills.

## 2012-02-11 NOTE — Telephone Encounter (Signed)
Please advise, patient due for follow up, is it okay to renew these until patient can get in.

## 2012-02-11 NOTE — Telephone Encounter (Signed)
PT STATES SHE HAVE TO CALL EACH TIME FOR HER ADVAIR,FLONASE AND ALBUTEROL. PLEASE CALL [phone]   [facility] [address]

## 2012-02-11 NOTE — Addendum Note (Signed)
Addended by: Fernande Bras on: 02/11/2012 07:17 PM   Modules accepted: Orders

## 2012-02-12 NOTE — Telephone Encounter (Signed)
Called pt, no voicemail

## 2012-02-16 ENCOUNTER — Ambulatory Visit: Payer: Federal, State, Local not specified - PPO

## 2012-02-16 ENCOUNTER — Ambulatory Visit (INDEPENDENT_AMBULATORY_CARE_PROVIDER_SITE_OTHER): Payer: Federal, State, Local not specified - PPO | Admitting: Internal Medicine

## 2012-02-16 VITALS — BP 152/92 | HR 78 | Temp 98.4°F | Resp 18 | Wt 159.0 lb

## 2012-02-16 DIAGNOSIS — R059 Cough, unspecified: Secondary | ICD-10-CM

## 2012-02-16 DIAGNOSIS — E785 Hyperlipidemia, unspecified: Secondary | ICD-10-CM

## 2012-02-16 DIAGNOSIS — J45909 Unspecified asthma, uncomplicated: Secondary | ICD-10-CM

## 2012-02-16 DIAGNOSIS — K219 Gastro-esophageal reflux disease without esophagitis: Secondary | ICD-10-CM

## 2012-02-16 DIAGNOSIS — G4700 Insomnia, unspecified: Secondary | ICD-10-CM

## 2012-02-16 DIAGNOSIS — J309 Allergic rhinitis, unspecified: Secondary | ICD-10-CM

## 2012-02-16 DIAGNOSIS — M549 Dorsalgia, unspecified: Secondary | ICD-10-CM

## 2012-02-16 DIAGNOSIS — M25569 Pain in unspecified knee: Secondary | ICD-10-CM

## 2012-02-16 DIAGNOSIS — R05 Cough: Secondary | ICD-10-CM

## 2012-02-16 DIAGNOSIS — I10 Essential (primary) hypertension: Secondary | ICD-10-CM

## 2012-02-16 DIAGNOSIS — M542 Cervicalgia: Secondary | ICD-10-CM

## 2012-02-16 DIAGNOSIS — K089 Disorder of teeth and supporting structures, unspecified: Secondary | ICD-10-CM

## 2012-02-16 DIAGNOSIS — Z6831 Body mass index (BMI) 31.0-31.9, adult: Secondary | ICD-10-CM

## 2012-02-16 DIAGNOSIS — F411 Generalized anxiety disorder: Secondary | ICD-10-CM

## 2012-02-16 DIAGNOSIS — H409 Unspecified glaucoma: Secondary | ICD-10-CM

## 2012-02-16 LAB — POCT CBC
HCT, POC: 42.8 % (ref 37.7–47.9)
Lymph, poc: 2.3 (ref 0.6–3.4)
MCH, POC: 26.6 pg — AB (ref 27–31.2)
MCHC: 30.4 g/dL — AB (ref 31.8–35.4)
MCV: 87.6 fL (ref 80–97)
POC LYMPH PERCENT: 29.2 %L (ref 10–50)
RDW, POC: 14.8 %
WBC: 7.8 10*3/uL (ref 4.6–10.2)

## 2012-02-16 LAB — COMPREHENSIVE METABOLIC PANEL
CO2: 27 mEq/L (ref 19–32)
Calcium: 9.3 mg/dL (ref 8.4–10.5)
Chloride: 106 mEq/L (ref 96–112)
Creat: 0.74 mg/dL (ref 0.50–1.10)
Glucose, Bld: 101 mg/dL — ABNORMAL HIGH (ref 70–99)
Total Bilirubin: 0.3 mg/dL (ref 0.3–1.2)

## 2012-02-16 LAB — TSH: TSH: 1.042 u[IU]/mL (ref 0.350–4.500)

## 2012-02-16 LAB — LIPID PANEL
Cholesterol: 177 mg/dL (ref 0–200)
HDL: 53 mg/dL (ref >39)
Total CHOL/HDL Ratio: 3.3 Ratio

## 2012-02-16 MED ORDER — DILTIAZEM HCL ER 120 MG PO CP24
120.0000 mg | ORAL_CAPSULE | Freq: Every day | ORAL | Status: DC
Start: 2012-02-16 — End: 2012-12-18

## 2012-02-16 MED ORDER — ROSUVASTATIN CALCIUM 20 MG PO TABS: 20.0000 mg | ORAL_TABLET | Freq: Every day | ORAL | Status: DC

## 2012-02-16 MED ORDER — HYDROCHLOROTHIAZIDE 25 MG PO TABS: 25.0000 mg | ORAL_TABLET | Freq: Every day | ORAL | Status: DC

## 2012-02-16 MED ORDER — HYDROCODONE-HOMATROPINE 5-1.5 MG/5ML PO SYRP: 5.0000 mL | ORAL_SOLUTION | Freq: Four times a day (QID) | ORAL | Status: AC | PRN

## 2012-02-16 MED ORDER — FAMOTIDINE 40 MG PO TABS: 40.0000 mg | ORAL_TABLET | Freq: Two times a day (BID) | ORAL | Status: DC

## 2012-02-16 MED ORDER — ALBUTEROL SULFATE HFA 108 (90 BASE) MCG/ACT IN AERS: 2.0000 | INHALATION_SPRAY | Freq: Four times a day (QID) | RESPIRATORY_TRACT | Status: DC | PRN

## 2012-02-16 MED ORDER — FLUTICASONE PROPIONATE 50 MCG/ACT NA SUSP: 2.0000 | Freq: Every day | NASAL | Status: DC

## 2012-02-16 MED ORDER — AMOXICILLIN 500 MG PO CAPS: 1000.0000 mg | ORAL_CAPSULE | Freq: Two times a day (BID) | ORAL | Status: AC

## 2012-02-16 MED ORDER — DEXLANSOPRAZOLE 60 MG PO CPDR: 60.0000 mg | DELAYED_RELEASE_CAPSULE | Freq: Every day | ORAL | Status: DC

## 2012-02-16 MED ORDER — TEMAZEPAM 30 MG PO CAPS: 30.0000 mg | ORAL_CAPSULE | Freq: Every evening | ORAL | Status: DC | PRN

## 2012-02-16 MED ORDER — LATANOPROST 0.005 % OP SOLN: 1.0000 [drp] | Freq: Two times a day (BID) | OPHTHALMIC | Status: DC

## 2012-02-16 MED ORDER — DIAZEPAM 10 MG PO TABS: 10.0000 mg | ORAL_TABLET | Freq: Three times a day (TID) | ORAL | Status: DC | PRN

## 2012-02-16 MED ORDER — BACLOFEN 20 MG PO TABS: 20.0000 mg | ORAL_TABLET | Freq: Every evening | ORAL | Status: DC | PRN

## 2012-02-16 NOTE — Progress Notes (Signed)
Subjective:    Patient ID: Brooke Wilson, female    DOB: [birth year], 57 y.o.   MRN: [id number]  HPIhere for f/u medical problems Patient Active Problem List  Diagnosis  . Essential hypertension, benign--BP usually controlled w/ home monitering but has been up for 1-2 weeks/is stressed by her 3 daughters all here for [employer] graduation-all in different stages of needing Mom  . Cough-the chronic cough has been related to reflux and to RAD in the past -she says is different than current problem  . Hyperlipemia-on Crestor /no side effects  . BMI 31.0-31.9,adult---has gained 10 lbs over the last 1-2 months  . GERD (gastroesophageal reflux disease)-Dexilant controls along w/ pepcid  . Anxiety state, unspecified  . Insomnia, unspecified-helped by Herbert Pun    -  Glaucoma-recently diagnosed and started on Xalatan   -  AR-flonase ---  RAD--has been able to stop advair without problems   -  Neck pain/Thoracic pain-Flexeril hs not as helpful/wants to try baclofen [place]-cervical and thoracic spasm from work and stress  Also c/o cough for 1 month-nagging/productive of white sputum Has self treated with cipro and steroids [place] but without improvement. Also notes cough worse at night, and has r frontal HA in am. Continues to work.   Also c/o pain with feeling of giveway L knee especially on stairs for the last 6 months/ NKI. No swelling. r knee also hurts occas     Review of Systems  Constitutional: Negative for fever, chills, activity change, appetite change and fatigue.  HENT: Positive for rhinorrhea, postnasal drip and sinus pressure. Negative for hearing loss, sore throat and dental problem.   Respiratory: Negative for apnea.   Cardiovascular: Negative for chest pain, palpitations and leg swelling.  Gastrointestinal: Negative for abdominal pain, diarrhea, constipation and blood in stool.    Genitourinary: Negative for difficulty urinating.  Musculoskeletal: Positive for myalgias, back pain, joint swelling, arthralgias and gait problem.  Skin: Negative for rash.  Neurological: Negative for dizziness, weakness and numbness.  Hematological: Negative for adenopathy. Does not bruise/bleed easily.  Psychiatric/Behavioral: Negative for confusion and decreased concentration.       Objective:   Physical Exam Filed Vitals:   02/16/12 0759  BP: 152/92  Pulse: 78  Temp: 98.4 F (36.9 C)  Resp: 18  Wt 159 PERRLA/EOMs conj Tms clear Nares w/purulence on R/R frontal tender to perc Throat clear/no nodes or thyromegaly Lungs clear to auscultation Heart regular without murmur Neck with decreased range of motion secondary to stiffness with slight discomfort Muscles in the thoracic area are tight and mildly tender The left knee has no effusion/valgus and varus are normal/anterior drawer normal/McMurray negative  She is tender over the inferior patellar and through the patellar tendon/ballot okay Right knee is stable Cranial nerves II through XII intact Cerebellar intact Mood is good/affect appropriate     [facility] reading (PRIMARY) by  Dr. Lanaya Bennis=chest-?RLL infiltr--Radiology says no       L Knee-bony changes at lat tubercle-Rad says possible        loose body      Assessment & Plan:   1. Cough -persistant/? Due to sinusitis/treat with amoxicillin and continued allergy therapy and followup in 3 weeks if not well//I stressed that self treatment was not a good idea-she has a long history of doing this and [place] access [place] medications [place]   2. Hyperlipemia -check labs on Crestor  3. GERD (gastroesophageal reflux disease) -continued Dexilant and Pepcid   4. Essential hypertension, benign - continue home blood pressures/if above 135/85 over the next month we will need to add therapy to her current meds(diltiaz and hctz)  5. BMI 31.0-31.9,adult -her 10 pound weight  gain he is likely playing a role with her elevated blood pressure /weight loss is stressed   6. Anxiety state, unspecified -this should improve as her daughters all return to school and work /valium prn  7. Pain, knee --exercises for patellar tendinitis will be started and if no response will consider orthopedic followup   8. Mid back pain -exercises should help this stress associated symptom   9. Neck pain -okay to try baclofen at bedtime   10. RAD (reactive airway disease)- Will discontinue Advair and followup prn   11. Glaucoma -medication refill   12. Dental disease   13. Insomnia -continue temazepam   14. AR (allergic rhinitis) -Flonase refilled    Meds ordered this encounter  Medications  . DISCONTD: latanoprost (XALATAN) 0.005 % ophthalmic solution    Sig: 1 drop at bedtime.  . baclofen (LIORESAL) 20 MG tablet    Sig: Take 1 tablet (20 mg total) by mouth at bedtime as needed.    Dispense:  30 each    Refill:  5  . diazepam (VALIUM) 10 MG tablet    Sig: Take 1 tablet (10 mg total) by mouth every 8 (eight) hours as needed for anxiety.    Dispense:  90 tablet    Refill:  1  . diltiazem (DILACOR XR) 120 MG 24 hr capsule    Sig: Take 1 capsule (120 mg total) by mouth daily.    Dispense:  90 capsule    Refill:  3  . hydrochlorothiazide (HYDRODIURIL) 25 MG tablet    Sig: Take 1 tablet (25 mg total) by mouth daily.    Dispense:  90 tablet    Refill:  3  . albuterol (PROVENTIL HFA;VENTOLIN HFA) 108 (90 BASE) MCG/ACT inhaler    Sig: Inhale 2 puffs into the lungs every 6 (six) hours as needed for wheezing or shortness of breath. Need office visit for additional refills.    Dispense:  8.5 g    Refill:  3    Order Specific Question:  Supervising Provider    Answer:  Kharizma Lesnick P [[id number]]  . dexlansoprazole (DEXILANT) 60 MG capsule    Sig: Take 1 capsule (60 mg total) by mouth daily.    Dispense:  30 capsule    Refill:  5  . latanoprost (XALATAN) 0.005 % ophthalmic solution     Sig: Place 1 drop into both eyes 2 (two) times daily. Dispense 90 day supply    Dispense:  2.5 mL    Refill:  3  . temazepam (RESTORIL) 30 MG capsule    Sig: Take 1 capsule (30 mg total) by mouth at bedtime as needed for sleep.    Dispense:  30 capsule    Refill:  5  . fluticasone (FLONASE) 50 MCG/ACT nasal spray    Sig: Place 2 sprays into the nose daily. Need office visit for additional refills.    Dispense:  16 g    Refill:  5    Order Specific Question:  Supervising Provider    Answer:  Burdett Pinzon P [[id number]]  . rosuvastatin (CRESTOR) 20 MG tablet    Sig: Take 1 tablet (20 mg total) by mouth daily.    Dispense:  90 tablet  Refill:  1  . famotidine (PEPCID) 40 MG tablet    Sig: Take 1 tablet (40 mg total) by mouth 2 (two) times daily.    Dispense:  180 tablet    Refill:  3  . amoxicillin (AMOXIL) 500 MG capsule    Sig: Take 2 capsules (1,000 mg total) by mouth 2 (two) times daily.    Dispense:  40 capsule    Refill:  0  . HYDROcodone-homatropine (HYCODAN) 5-1.5 MG/5ML syrup    Sig: Take 5 mLs by mouth every 6 (six) hours as needed for cough.    Dispense:  120 mL    Refill:  0

## 2012-02-16 NOTE — Telephone Encounter (Signed)
Pt came in to be seen today by Dr Merla Riches.

## 2012-02-17 ENCOUNTER — Encounter: Payer: Self-pay | Admitting: Internal Medicine

## 2012-02-17 DIAGNOSIS — K089 Disorder of teeth and supporting structures, unspecified: Secondary | ICD-10-CM | POA: Insufficient documentation

## 2012-02-17 DIAGNOSIS — M549 Dorsalgia, unspecified: Secondary | ICD-10-CM

## 2012-02-17 DIAGNOSIS — G8929 Other chronic pain: Secondary | ICD-10-CM | POA: Insufficient documentation

## 2012-02-17 DIAGNOSIS — J309 Allergic rhinitis, unspecified: Secondary | ICD-10-CM | POA: Insufficient documentation

## 2012-02-17 DIAGNOSIS — J45909 Unspecified asthma, uncomplicated: Secondary | ICD-10-CM

## 2012-02-17 DIAGNOSIS — M542 Cervicalgia: Secondary | ICD-10-CM

## 2012-02-17 HISTORY — DX: Cervicalgia: M54.2

## 2012-02-17 HISTORY — DX: Dorsalgia, unspecified: M54.9

## 2012-02-17 HISTORY — DX: Disorder of teeth and supporting structures, unspecified: K08.9

## 2012-02-17 HISTORY — DX: Allergic rhinitis, unspecified: J30.9

## 2012-02-17 HISTORY — DX: Unspecified asthma, uncomplicated: J45.909

## 2012-02-18 ENCOUNTER — Telehealth: Payer: Self-pay | Admitting: *Deleted

## 2012-02-18 ENCOUNTER — Telehealth: Payer: Self-pay | Admitting: Radiology

## 2012-02-18 DIAGNOSIS — G4700 Insomnia, unspecified: Secondary | ICD-10-CM

## 2012-02-18 DIAGNOSIS — M549 Dorsalgia, unspecified: Secondary | ICD-10-CM

## 2012-02-18 DIAGNOSIS — M542 Cervicalgia: Secondary | ICD-10-CM

## 2012-02-18 MED ORDER — DIAZEPAM 10 MG PO TABS: 10.0000 mg | ORAL_TABLET | Freq: Three times a day (TID) | ORAL | Status: DC | PRN

## 2012-02-18 MED ORDER — BACLOFEN 20 MG PO TABS: 20.0000 mg | ORAL_TABLET | Freq: Every evening | ORAL | Status: DC | PRN

## 2012-02-18 MED ORDER — TEMAZEPAM 30 MG PO CAPS: 30.0000 mg | ORAL_CAPSULE | Freq: Every evening | ORAL | Status: DC | PRN

## 2012-02-18 NOTE — Telephone Encounter (Signed)
Spoke to pharmacy about this, to clarify. 3 mo supply is #270, this is corrected.

## 2012-02-18 NOTE — Telephone Encounter (Signed)
Ok to call in 90d supplies to replace others//1 ref/36mos meds total

## 2012-02-18 NOTE — Telephone Encounter (Signed)
Pharmacy states that pt wants a 90 day supply of meds. Baclofen, temazepam, and diaepam.

## 2012-02-18 NOTE — Telephone Encounter (Signed)
Changed all Rxs to 90 day supplies w/1 RF. Called in the temazepam and diazepam.

## 2012-03-01 ENCOUNTER — Telehealth: Payer: Self-pay

## 2012-03-01 DIAGNOSIS — I10 Essential (primary) hypertension: Secondary | ICD-10-CM

## 2012-03-01 NOTE — Telephone Encounter (Signed)
Patient is requesting Norvasc - [facility] [place]  Does not want the lisinopril **  Call back number is [phone] (H)

## 2012-03-02 NOTE — Telephone Encounter (Signed)
Pt called back and asked that the Rx be sent in as a 90 day supply.

## 2012-03-02 NOTE — Telephone Encounter (Signed)
pts blood pressures contine to 148-160 over 90-98. She states she wants to add Norvasc 10mg   to her regimen. She took one of her husbands in addition to her other meds, and it helps. Please advise if you want to add norvasc, (see your letter) Brooke Wilson

## 2012-03-04 MED ORDER — AMLODIPINE BESYLATE 10 MG PO TABS: 10.0000 mg | ORAL_TABLET | Freq: Every day | ORAL | Status: DC

## 2012-03-04 NOTE — Telephone Encounter (Signed)
Meds ordered this encounter  Medications  . amLODipine (NORVASC) 10 MG tablet    Sig: Take 1 tablet (10 mg total) by mouth daily.    Dispense:  90 tablet    Refill:  3    

## 2012-03-04 NOTE — Telephone Encounter (Signed)
RX sent to pharmacy, pt notified.

## 2012-04-01 ENCOUNTER — Other Ambulatory Visit: Payer: Self-pay

## 2012-05-09 ENCOUNTER — Ambulatory Visit (INDEPENDENT_AMBULATORY_CARE_PROVIDER_SITE_OTHER): Payer: Federal, State, Local not specified - PPO | Admitting: Internal Medicine

## 2012-05-09 VITALS — BP 118/76 | HR 94 | Temp 98.2°F | Resp 16 | Ht 59.0 in | Wt 156.0 lb

## 2012-05-09 DIAGNOSIS — R059 Cough, unspecified: Secondary | ICD-10-CM

## 2012-05-09 DIAGNOSIS — J019 Acute sinusitis, unspecified: Secondary | ICD-10-CM

## 2012-05-09 DIAGNOSIS — J45909 Unspecified asthma, uncomplicated: Secondary | ICD-10-CM

## 2012-05-09 DIAGNOSIS — R05 Cough: Secondary | ICD-10-CM

## 2012-05-09 MED ORDER — BENZONATATE 100 MG PO CAPS: 100.0000 mg | ORAL_CAPSULE | Freq: Two times a day (BID) | ORAL | Status: DC | PRN

## 2012-05-09 MED ORDER — AMOXICILLIN-POT CLAVULANATE 875-125 MG PO TABS: 1.0000 | ORAL_TABLET | Freq: Two times a day (BID) | ORAL | Status: DC

## 2012-05-09 MED ORDER — PREDNISONE 20 MG PO TABS: ORAL_TABLET | ORAL | Status: DC

## 2012-05-10 NOTE — Progress Notes (Signed)
  Subjective:    Patient ID: Brooke Wilson, female    DOB: [birth year], 57 y.o.   MRN: [id number]  HPIc/o cough,wheezing, sinus congestion,lowgrade fever for 10 days Self prescribed Cipro with no effect/then administered 4 doses once a day of Rocephin (she gets [place] medications from [place] and continues to use them whenever she thinks she needs)  Which has improved her condition 50% Fever has resolved in the last day Cough is persistent    Review of Systems     Objective:   Physical Exam BP 118/76  Pulse 94  Temp(Src) 98.2 F (36.8 C) (Oral)  Resp 16  Ht 4\' 11"  (1.499 m)  Wt 156 lb (70.761 kg)  BMI 31.49 kg/m2  SpO2 96% TMs clear Nares boggy with purulent discharge Throat clear Chest clear to auscultation/mild wheezing with forced expiration       Assessment & Plan:  Sinusitis Reactive airway disease  Discussed misuse of medication once again Meds ordered this encounter  Medications  . amoxicillin-clavulanate (AUGMENTIN) 875-125 MG per tablet    Sig: Take 1 tablet by mouth 2 (two) times daily.    Dispense:  20 tablet    Refill:  0  . benzonatate (TESSALON) 100 MG capsule    Sig: Take 1 capsule (100 mg total) by mouth 2 (two) times daily as needed for cough.    Dispense:  20 capsule    Refill:  0  . predniSONE (DELTASONE) 20 MG tablet    Sig: 3/3/2/2/1/1 single daily dose for 6 days    Dispense:  12 tablet    Refill:  0   May use albuterol when necessary followup 2 weeks if not well

## 2012-08-29 ENCOUNTER — Other Ambulatory Visit: Payer: Self-pay | Admitting: Internal Medicine

## 2012-09-11 ENCOUNTER — Other Ambulatory Visit: Payer: Self-pay | Admitting: Internal Medicine

## 2012-10-13 ENCOUNTER — Other Ambulatory Visit: Payer: Self-pay | Admitting: Internal Medicine

## 2012-10-13 ENCOUNTER — Ambulatory Visit (INDEPENDENT_AMBULATORY_CARE_PROVIDER_SITE_OTHER): Payer: Federal, State, Local not specified - PPO | Admitting: Internal Medicine

## 2012-10-13 VITALS — BP 130/82 | HR 85 | Temp 98.7°F | Resp 16 | Ht 59.0 in | Wt 151.8 lb

## 2012-10-13 DIAGNOSIS — R51 Headache: Secondary | ICD-10-CM

## 2012-10-13 DIAGNOSIS — J329 Chronic sinusitis, unspecified: Secondary | ICD-10-CM

## 2012-10-13 MED ORDER — BENZONATATE 100 MG PO CAPS: 100.0000 mg | ORAL_CAPSULE | Freq: Two times a day (BID) | ORAL | Status: DC | PRN

## 2012-10-13 MED ORDER — AMOXICILLIN-POT CLAVULANATE 875-125 MG PO TABS: 1.0000 | ORAL_TABLET | Freq: Two times a day (BID) | ORAL | Status: DC

## 2012-10-13 NOTE — Progress Notes (Signed)
Subjective:    Patient ID: Brooke Wilson, female    DOB: Jul 19, 1955, 57 y.o.   MRN: [id number]  HPI she is very concerned about a new HA--present 3 months  L/R temporal Comes and goes/asa relieves(8 a day) Throbbing No vis chges/no nausea Unrelated to stiff neck(baclofen) Dexilant but GI worse due to ASA fam hx uncle/father w/ aneurysm and she is afraid she has an aneurysm She is taking of aspirin that is aggravating her GERD  Patient Active Problem List   Diagnosis Date Noted  . Essential hypertension, benign 03/23/2011    Priority: High  . RAD (reactive airway disease) 02/17/2012    Priority: Medium  . GERD (gastroesophageal reflux disease) 05/17/2011    Priority: Medium  . Hyperlipemia 03/23/2011    Priority: Medium  . BMI 31.0-31.9,adult 03/23/2011    Priority: Medium  . Insomnia, unspecified 05/17/2011    Priority: Low  . Mid back pain 02/17/2012  . Neck pain 02/17/2012  . AR (allergic rhinitis) 02/17/2012  . Dental disease 02/17/2012  . Anxiety state, unspecified 05/17/2011  . Cough 03/23/2011     Current outpatient prescriptions:albuterol (PROVENTIL HFA;VENTOLIN HFA) 108 (90 BASE) MCG/ACT inhaler, Inhale 2 puffs into the lungs every 6 (six) hours as needed for wheezing or shortness of breath.  amLODipine (NORVASC) 10 MG tablet, Take 1 tablet (10 mg total) by mouth daily., Disp: 90 tablet, Rfl: 3 baclofen (LIORESAL) 20 MG tablet, Take 1 tablet by mouth at bedtime as needed. PATIENT NEEDS OFFICE VISIT FOR ADDITIONAL REFILLS, Disp: 30 tablet, Rfl: 0 benzonatate (TESSALON) 100 MG capsule, Take 1 capsule (100 mg total) by mouth 2 (two) times daily as needed for cough., Disp: 20 capsule, Rfl: 0;  dexlansoprazole (DEXILANT) 60 MG capsule, Take 1 capsule (60 mg total) by mouth daily., Disp: 30    diazepam (VALIUM) 10 MG tablet, TAKE 1 TABLET BY MOUTH EVERY 8 HOURS AS NEEDED FOR ANXIETY, Disp: 270 tablet, Rfl: 1 diltiazem (DILACOR XR) 120 MG 24 hr capsule, Take 1 capsule  (120 mg total) by mouth daily., Disp: 90 capsule, Rfl: 3;  famotidine (PEPCID) 40 MG tablet, Take 1 tablet (40 mg total) by mouth 2 (two) times daily., Disp: 180 tablet, Rfl: 3;   fluticasone (FLONASE) 50 MCG/ACT nasal spray, Place 2 sprays into the nose daily. Need office visit for additional refills., Disp: 16 g, Rfl: 5 Fluticasone-Salmeterol (ADVAIR) 250-50 MCG/DOSE AEPB, Inhale 1 puff into the lungs every 12 (twelve) hours. Need office visit for additional refills., Disp: 60 each, Rfl: 0;   hydrochlorothiazide (HYDRODIURIL) 25 MG tablet, Take 1 tablet (25 mg total) by mouth daily., Disp: 90 tablet, Rfl: 3;   ibuprofen (ADVIL,MOTRIN) 800 MG tablet, TAKE 1 TABLET BY MOUTH THREE TIMES DAILY, Disp: 90 tablet, Rfl: 0 latanoprost (XALATAN) 0.005 % ophthalmic solution, Place 1 drop into both eyes 2 (two) times daily. Dispense 90 day supply, Disp: 2.5 mL, Rfl: 3;   rosuvastatin (CRESTOR) 20 MG tablet, Take 1 tablet (20 mg total) by mouth daily., Disp: 90 tablet, Rfl: 1 temazepam (RESTORIL) 30 MG capsule, TAKE ONE CAPSULE BY MOUTH EVERY DAY AT BEDTIME AS NEEDED FOR SLEEP,  Review of Systems St/runny nose/sinus pain No fever chills or night sweats No weight loss No syncope or dizziness No chest pain or palpitations Recent stressors as all 3 daughters are still struggling to establish their careers    Objective:   Physical Exam BP 130/82  Pulse 85  Temp(Src) 98.7 F (37.1 C) (Oral)  Resp 16  Ht 4'  11" (1.499 m)  Wt 151 lb 12.8 oz (68.856 kg)  BMI 30.64 kg/m2  SpO2 97% No acute distress Pupils equal round reactive to light and accommodation/EOMs conjugate TMs clear/nares slightly boggy//purulence on the right//tender maxillary areas Throat clear No nodes or thyromegaly Neck range of motion was decreased flexion due to discomfort No sensory or motor changes in the upper extremities Heart regular without murmur No carotid bruit audible although there is one on the right according to prior  studies Lungs clear Cranial nerves II through XII intact Romberg negative No peripheral motor or sensory losses detected       Assessment & Plan:  Problem #1 new onset headache in the setting of hypertension and hyperlipidemia and a family history of cerebral aneurysms  Will proceed with CT scan/note that she had an MRA in 2012 and I explained to her that this should remove any concern of congenital aneurysm or for aneurysm that had developed over the course of her time with hypertension Problem #2 recurrent sinusitis secondary to allergic rhinitis  She has responded to Augmentin in the past     Meds ordered this encounter  Medications  . amoxicillin-clavulanate (AUGMENTIN) 875-125 MG per tablet    Sig: Take 1 tablet by mouth 2 (two) times daily.    Dispense:  20 tablet    Refill:  0  . benzonatate (TESSALON) 100 MG capsule    Sig: Take 1 capsule (100 mg total) by mouth 2 (two) times daily as needed for cough.    Dispense:  30 capsule    Refill:  1

## 2012-10-19 ENCOUNTER — Ambulatory Visit
Admission: RE | Admit: 2012-10-19 | Discharge: 2012-10-19 | Disposition: A | Discharge status: Home / Self Care | Payer: Federal, State, Local not specified - PPO | Source: Ambulatory Visit | Attending: Internal Medicine | Admitting: Internal Medicine

## 2012-10-19 DIAGNOSIS — R51 Headache: Secondary | ICD-10-CM

## 2012-11-14 ENCOUNTER — Other Ambulatory Visit: Payer: Self-pay | Admitting: Obstetrics and Gynecology

## 2012-11-14 DIAGNOSIS — Z1231 Encounter for screening mammogram for malignant neoplasm of breast: Secondary | ICD-10-CM

## 2012-11-16 ENCOUNTER — Other Ambulatory Visit: Payer: Self-pay | Admitting: Obstetrics and Gynecology

## 2012-11-16 ENCOUNTER — Ambulatory Visit (HOSPITAL_COMMUNITY)
Admission: RE | Admit: 2012-11-16 | Discharge: 2012-11-16 | Disposition: A | Discharge status: Home / Self Care | Payer: Federal, State, Local not specified - PPO | Source: Ambulatory Visit | Attending: Obstetrics and Gynecology | Admitting: Obstetrics and Gynecology

## 2012-11-16 DIAGNOSIS — Z1231 Encounter for screening mammogram for malignant neoplasm of breast: Secondary | ICD-10-CM

## 2012-11-21 ENCOUNTER — Other Ambulatory Visit: Payer: Self-pay | Admitting: Obstetrics and Gynecology

## 2012-11-21 DIAGNOSIS — R928 Other abnormal and inconclusive findings on diagnostic imaging of breast: Secondary | ICD-10-CM

## 2012-11-23 ENCOUNTER — Other Ambulatory Visit: Payer: Self-pay | Admitting: Obstetrics and Gynecology

## 2012-11-23 ENCOUNTER — Ambulatory Visit
Admission: RE | Admit: 2012-11-23 | Discharge: 2012-11-23 | Disposition: A | Discharge status: Home / Self Care | Payer: Federal, State, Local not specified - PPO | Source: Ambulatory Visit | Attending: Obstetrics and Gynecology | Admitting: Obstetrics and Gynecology

## 2012-11-23 DIAGNOSIS — R928 Other abnormal and inconclusive findings on diagnostic imaging of breast: Secondary | ICD-10-CM

## 2012-11-24 ENCOUNTER — Ambulatory Visit
Admission: RE | Admit: 2012-11-24 | Discharge: 2012-11-24 | Disposition: A | Discharge status: Home / Self Care | Payer: Federal, State, Local not specified - PPO | Source: Ambulatory Visit | Attending: Obstetrics and Gynecology | Admitting: Obstetrics and Gynecology

## 2012-11-24 DIAGNOSIS — R928 Other abnormal and inconclusive findings on diagnostic imaging of breast: Secondary | ICD-10-CM

## 2012-11-27 ENCOUNTER — Other Ambulatory Visit: Payer: Self-pay | Admitting: Internal Medicine

## 2012-12-09 ENCOUNTER — Other Ambulatory Visit: Payer: Self-pay | Admitting: Internal Medicine

## 2012-12-15 ENCOUNTER — Telehealth: Payer: Self-pay

## 2012-12-15 NOTE — Telephone Encounter (Signed)
Patient is due for repeat labs - last done in January and was told to have recheck at 6 months.

## 2012-12-15 NOTE — Telephone Encounter (Signed)
Patient is wondering why she only got 30 day rx instead of 90 day rx. Would like to pick up 90 day rx as soon as possible so she doesn't have to keep going back to the pharmacy.

## 2012-12-17 NOTE — Telephone Encounter (Signed)
No answer no vm

## 2012-12-18 ENCOUNTER — Ambulatory Visit (INDEPENDENT_AMBULATORY_CARE_PROVIDER_SITE_OTHER): Payer: Federal, State, Local not specified - PPO | Admitting: Internal Medicine

## 2012-12-18 VITALS — BP 138/80 | HR 90 | Temp 98.1°F | Resp 16 | Ht 59.5 in | Wt 152.0 lb

## 2012-12-18 DIAGNOSIS — J329 Chronic sinusitis, unspecified: Secondary | ICD-10-CM

## 2012-12-18 DIAGNOSIS — F411 Generalized anxiety disorder: Secondary | ICD-10-CM

## 2012-12-18 DIAGNOSIS — J45909 Unspecified asthma, uncomplicated: Secondary | ICD-10-CM

## 2012-12-18 DIAGNOSIS — K219 Gastro-esophageal reflux disease without esophagitis: Secondary | ICD-10-CM

## 2012-12-18 DIAGNOSIS — E785 Hyperlipidemia, unspecified: Secondary | ICD-10-CM

## 2012-12-18 DIAGNOSIS — G4700 Insomnia, unspecified: Secondary | ICD-10-CM

## 2012-12-18 DIAGNOSIS — J309 Allergic rhinitis, unspecified: Secondary | ICD-10-CM

## 2012-12-18 DIAGNOSIS — I10 Essential (primary) hypertension: Secondary | ICD-10-CM

## 2012-12-18 MED ORDER — AMOXICILLIN-POT CLAVULANATE 875-125 MG PO TABS: 1.0000 | ORAL_TABLET | Freq: Two times a day (BID) | ORAL | Status: DC

## 2012-12-18 MED ORDER — AMLODIPINE BESYLATE 10 MG PO TABS: 10.0000 mg | ORAL_TABLET | Freq: Every day | ORAL | Status: DC

## 2012-12-18 MED ORDER — DILTIAZEM HCL ER 120 MG PO CP24: 120.0000 mg | ORAL_CAPSULE | Freq: Every day | ORAL | Status: DC

## 2012-12-18 MED ORDER — ROSUVASTATIN CALCIUM 20 MG PO TABS: 20.0000 mg | ORAL_TABLET | Freq: Every day | ORAL | Status: DC

## 2012-12-18 MED ORDER — TEMAZEPAM 30 MG PO CAPS: ORAL_CAPSULE | ORAL | Status: DC

## 2012-12-18 MED ORDER — MONTELUKAST SODIUM 10 MG PO TABS: 10.0000 mg | ORAL_TABLET | Freq: Every day | ORAL | Status: DC

## 2012-12-18 MED ORDER — TRAMADOL-ACETAMINOPHEN 37.5-325 MG PO TABS: 1.0000 | ORAL_TABLET | Freq: Four times a day (QID) | ORAL | Status: DC | PRN

## 2012-12-18 MED ORDER — BENZONATATE 100 MG PO CAPS: 100.0000 mg | ORAL_CAPSULE | Freq: Two times a day (BID) | ORAL | Status: DC | PRN

## 2012-12-18 MED ORDER — HYDROCHLOROTHIAZIDE 25 MG PO TABS: 25.0000 mg | ORAL_TABLET | Freq: Every day | ORAL | Status: DC

## 2012-12-18 MED ORDER — DIAZEPAM 10 MG PO TABS: ORAL_TABLET | ORAL | Status: DC

## 2012-12-18 NOTE — Progress Notes (Signed)
Subjective:    Patient ID: Brooke Wilson, female    DOB: [birth year], 57 y.o.   MRN: [id number]  HPIhere for recurrence of sinus sxt following allergic changes with the season Would like to try singulair for prevention  Also having trouble with pharamcy refills-told we are not calling back  Hyperlipemia -  rosuvastatin (CRESTOR) 20 MG tablet HTN (hypertension) -  amLODipine (NORVASC) 10 MG tablet      hydrochlorothiazide (HYDRODIURIL) 25 MG tablet, PAT diltiazem (DILACOR XR) 120 MG 24 hr capsule GERD (gastroesophageal reflux disease)---prn dexulant--controlled RAD (reactive airway disease) Insomnia, unspecified Anxiety state, unspecified AR (allergic rhinitis)  Recent breast bx=benign  Review of Systems  Constitutional: Negative for activity change, appetite change, fatigue and unexpected weight change.  Eyes: Negative for visual disturbance.  Respiratory: Negative for cough and shortness of breath.   Cardiovascular: Negative for chest pain, palpitations and leg swelling.  Neurological: Negative for headaches.       Objective:   Physical Exam BP 138/80  Pulse 90  Temp(Src) 98.1 F (36.7 C)  Resp 16  Ht 4' 11.5" (1.511 m)  Wt 152 lb (68.947 kg)  BMI 30.20 kg/m2  SpO2 97% Tms cl PERRLA/EOMs conj Nares purulent d/c--thr clear No nodes or thyrmeg Ht reg no M Lungs clear extr clear      Assessment & Plan:  Sinusitis - Plan: amoxicillin-clavulanate (AUGMENTIN) 875-125 MG per tablet  Hyperlipemia - Plan: rosuvastatin (CRESTOR) 20 MG tablet  HTN (hypertension) - Plan: amLODipine (NORVASC) 10 MG tablet  Essential hypertension, benign - Plan: hydrochlorothiazide (HYDRODIURIL) 25 MG tablet, diltiazem (DILACOR XR) 120 MG 24 hr capsule  GERD (gastroesophageal reflux disease)  RAD (reactive airway disease)  Insomnia, unspecified  Anxiety state, unspecified  AR (allergic rhinitis)  Meds ordered this encounter  Medications  . DISCONTD:  traMADol-acetaminophen (ULTRACET) 37.5-325 MG per tablet    Sig: Take 1 tablet by mouth every 6 (six) hours as needed for pain.  Marland Kitchen amoxicillin-clavulanate (AUGMENTIN) 875-125 MG per tablet    Sig: Take 1 tablet by mouth 2 (two) times daily.    Dispense:  20 tablet    Refill:  0  . benzonatate (TESSALON) 100 MG capsule    Sig: Take 1 capsule (100 mg total) by mouth 2 (two) times daily as needed for cough.    Dispense:  30 capsule    Refill:  1  . rosuvastatin (CRESTOR) 20 MG tablet    Sig: Take 1 tablet (20 mg total) by mouth daily.    Dispense:  90 tablet    Refill:  1  . montelukast (SINGULAIR) 10 MG tablet    Sig: Take 1 tablet (10 mg total) by mouth at bedtime.    Dispense:  90 tablet    Refill:  3  . traMADol-acetaminophen (ULTRACET) 37.5-325 MG per tablet    Sig: Take 1 tablet by mouth every 6 (six) hours as needed for pain.    Dispense:  30 tablet    Refill:  5  . amLODipine (NORVASC) 10 MG tablet    Sig: Take 1 tablet (10 mg total) by mouth daily.    Dispense:  90 tablet    Refill:  3  . hydrochlorothiazide (HYDRODIURIL) 25 MG tablet    Sig: Take 1 tablet (25 mg total) by mouth daily.    Dispense:  90 tablet    Refill:  3  . diazepam (VALIUM) 10 MG tablet    Sig: TAKE 1 TABLET BY MOUTH EVERY 8 HOURS AS NEEDED FOR  ANXIETY    Dispense:  270 tablet    Refill:  1    *NEEDS 90 DAYS SUPPLY  . temazepam (RESTORIL) 30 MG capsule    Sig: TAKE ONE CAPSULE BY MOUTH EVERY DAY AT BEDTIME AS NEEDED FOR SLEEP    Dispense:  90 capsule    Refill:  1    *NEEDS 90 DAY SUPPLY  . diltiazem (DILACOR XR) 120 MG 24 hr capsule    Sig: Take 1 capsule (120 mg total) by mouth daily.    Dispense:  90 capsule    Refill:  3

## 2012-12-20 NOTE — Telephone Encounter (Signed)
Called again, no answer no VM unable to reach letter sent.

## 2012-12-21 ENCOUNTER — Other Ambulatory Visit: Payer: Self-pay

## 2013-01-16 ENCOUNTER — Other Ambulatory Visit: Payer: Self-pay | Admitting: Internal Medicine

## 2013-01-25 ENCOUNTER — Telehealth: Payer: Self-pay

## 2013-01-25 MED ORDER — BACLOFEN 20 MG PO TABS: ORAL_TABLET | ORAL | Status: DC

## 2013-01-25 MED ORDER — IBUPROFEN 800 MG PO TABS: ORAL_TABLET | ORAL | Status: DC

## 2013-01-25 NOTE — Telephone Encounter (Signed)
Called her voice mail not set up yet. All her meds appear to have been sent for 3 mo or greater, what is she referring to ?

## 2013-01-25 NOTE — Telephone Encounter (Signed)
rxs for baclofen changes to just a 3 month supply with no refills.

## 2013-01-25 NOTE — Telephone Encounter (Signed)
Patient is calling to say that she would like to speak to dr Merla Riches about her precscriptions, she said they were supposed to be 3 month supply and she did not get that please call her at [phone]

## 2013-02-17 ENCOUNTER — Ambulatory Visit (INDEPENDENT_AMBULATORY_CARE_PROVIDER_SITE_OTHER): Payer: Federal, State, Local not specified - PPO | Admitting: Internal Medicine

## 2013-02-17 ENCOUNTER — Telehealth: Payer: Self-pay

## 2013-02-17 VITALS — BP 128/82 | HR 94 | Temp 98.6°F | Resp 17 | Ht 61.0 in | Wt 152.0 lb

## 2013-02-17 DIAGNOSIS — M1710 Unilateral primary osteoarthritis, unspecified knee: Secondary | ICD-10-CM

## 2013-02-17 DIAGNOSIS — IMO0002 Reserved for concepts with insufficient information to code with codable children: Secondary | ICD-10-CM

## 2013-02-17 DIAGNOSIS — I10 Essential (primary) hypertension: Secondary | ICD-10-CM

## 2013-02-17 DIAGNOSIS — E785 Hyperlipidemia, unspecified: Secondary | ICD-10-CM

## 2013-02-17 DIAGNOSIS — F411 Generalized anxiety disorder: Secondary | ICD-10-CM

## 2013-02-17 DIAGNOSIS — G8929 Other chronic pain: Secondary | ICD-10-CM

## 2013-02-17 DIAGNOSIS — K219 Gastro-esophageal reflux disease without esophagitis: Secondary | ICD-10-CM

## 2013-02-17 DIAGNOSIS — J309 Allergic rhinitis, unspecified: Secondary | ICD-10-CM

## 2013-02-17 DIAGNOSIS — M1712 Unilateral primary osteoarthritis, left knee: Secondary | ICD-10-CM

## 2013-02-17 DIAGNOSIS — G4700 Insomnia, unspecified: Secondary | ICD-10-CM

## 2013-02-17 DIAGNOSIS — M542 Cervicalgia: Secondary | ICD-10-CM

## 2013-02-17 LAB — CBC WITH DIFFERENTIAL/PLATELET
Basophils Absolute: 0.1 10*3/uL (ref 0.0–0.1)
Basophils Relative: 1 % (ref 0–1)
EOS ABS: 0.2 10*3/uL (ref 0.0–0.7)
EOS PCT: 3 % (ref 0–5)
HEMATOCRIT: 41.2 % (ref 36.0–46.0)
HEMOGLOBIN: 14.1 g/dL (ref 12.0–15.0)
Lymphocytes Relative: 30 % (ref 12–46)
Lymphs Abs: 1.9 10*3/uL (ref 0.7–4.0)
MCH: 26.7 pg (ref 26.0–34.0)
MCHC: 34.2 g/dL (ref 30.0–36.0)
MCV: 77.9 fL — ABNORMAL LOW (ref 78.0–100.0)
Monocytes Absolute: 0.4 10*3/uL (ref 0.1–1.0)
Monocytes Relative: 7 % (ref 3–12)
Neutro Abs: 3.9 10*3/uL (ref 1.7–7.7)
Neutrophils Relative %: 59 % (ref 43–77)
Platelets: 296 10*3/uL (ref 150–400)
RBC: 5.29 MIL/uL — ABNORMAL HIGH (ref 3.87–5.11)
RDW: 14.6 % (ref 11.5–15.5)
WBC: 6.5 10*3/uL (ref 4.0–10.5)

## 2013-02-17 LAB — COMPREHENSIVE METABOLIC PANEL
ALT: 11 U/L (ref 0–35)
AST: 16 U/L (ref 0–37)
Albumin: 4.6 g/dL (ref 3.5–5.2)
Alkaline Phosphatase: 104 U/L (ref 39–117)
BILIRUBIN TOTAL: 0.4 mg/dL (ref 0.3–1.2)
BUN: 18 mg/dL (ref 6–23)
CALCIUM: 9.9 mg/dL (ref 8.4–10.5)
CO2: 28 mEq/L (ref 19–32)
Chloride: 98 mEq/L (ref 96–112)
Creat: 0.72 mg/dL (ref 0.50–1.10)
GLUCOSE: 100 mg/dL — AB (ref 70–99)
Potassium: 3.5 mEq/L (ref 3.5–5.3)
Sodium: 136 mEq/L (ref 135–145)
TOTAL PROTEIN: 7.6 g/dL (ref 6.0–8.3)

## 2013-02-17 LAB — LIPID PANEL
CHOL/HDL RATIO: 2.6 ratio
Cholesterol: 181 mg/dL (ref 0–200)
HDL: 69 mg/dL (ref >39)
LDL Cholesterol: 94 mg/dL (ref 0–99)
Triglycerides: 88 mg/dL (ref <150)
VLDL: 18 mg/dL (ref 0–40)

## 2013-02-17 LAB — POCT GLYCOSYLATED HEMOGLOBIN (HGB A1C): HEMOGLOBIN A1C: 5.9

## 2013-02-17 MED ORDER — DEXLANSOPRAZOLE 60 MG PO CPDR: 60.0000 mg | DELAYED_RELEASE_CAPSULE | Freq: Every day | ORAL | Status: DC

## 2013-02-17 MED ORDER — TEMAZEPAM 30 MG PO CAPS: ORAL_CAPSULE | ORAL | Status: DC

## 2013-02-17 MED ORDER — TRAMADOL-ACETAMINOPHEN 37.5-325 MG PO TABS: 1.0000 | ORAL_TABLET | Freq: Four times a day (QID) | ORAL | Status: DC | PRN

## 2013-02-17 MED ORDER — DIAZEPAM 10 MG PO TABS: ORAL_TABLET | ORAL | Status: DC

## 2013-02-17 MED ORDER — FLUTICASONE PROPIONATE 50 MCG/ACT NA SUSP: 2.0000 | Freq: Every day | NASAL | Status: DC

## 2013-02-17 MED ORDER — DILTIAZEM HCL ER 120 MG PO CP24: 120.0000 mg | ORAL_CAPSULE | Freq: Every day | ORAL | Status: AC

## 2013-02-17 MED ORDER — AMLODIPINE BESYLATE 10 MG PO TABS: 10.0000 mg | ORAL_TABLET | Freq: Every day | ORAL | Status: DC

## 2013-02-17 MED ORDER — ROSUVASTATIN CALCIUM 20 MG PO TABS: 20.0000 mg | ORAL_TABLET | Freq: Every day | ORAL | Status: DC

## 2013-02-17 MED ORDER — MONTELUKAST SODIUM 10 MG PO TABS: 10.0000 mg | ORAL_TABLET | Freq: Every day | ORAL | Status: DC

## 2013-02-17 MED ORDER — BACLOFEN 20 MG PO TABS: ORAL_TABLET | ORAL | Status: DC

## 2013-02-17 MED ORDER — AMOXICILLIN-POT CLAVULANATE 875-125 MG PO TABS
1.0000 | ORAL_TABLET | Freq: Two times a day (BID) | ORAL | Status: DC
Start: 2013-02-17 — End: 2013-12-31

## 2013-02-17 MED ORDER — FAMOTIDINE 40 MG PO TABS: 40.0000 mg | ORAL_TABLET | Freq: Two times a day (BID) | ORAL | Status: DC

## 2013-02-17 MED ORDER — HYDROCHLOROTHIAZIDE 25 MG PO TABS: 25.0000 mg | ORAL_TABLET | Freq: Every day | ORAL | Status: DC

## 2013-02-17 NOTE — Progress Notes (Signed)
Subjective:    Patient ID: Brooke Wilson, female    DOB: [birth year], 58 y.o.   MRN: [id number]  HPI  Patient Active Problem List   Diagnosis Date Noted  . Essential hypertension, benign 03/23/2011    Priority: High  . RAD (reactive airway disease) 02/17/2012    Priority: Medium  . GERD (gastroesophageal reflux disease) 05/17/2011    Priority: Medium  . Hyperlipemia 03/23/2011    Priority: Medium  . BMI 31.0-31.9,adult 03/23/2011    Priority: Medium  . Insomnia, unspecified 05/17/2011    Priority: Low--- continues to respond her Restoril   . Mid back pain 02/17/2012  . Neck pain----using baclofen at bedtime and occasionally requiring Ultracet  02/17/2012  . AR (allergic rhinitis) 02/17/2012  . Dental disease 02/17/2012  . Anxiety state, unspecified--- when necessary Valium  05/17/2011  . Cough--- reactive airway disease plus allergic rhinitis  03/23/2011    -  osteoarth knee L>R----afraid to pursue surgery so using ultracet and ibuprofen   meds as listed//no changes or problems recently  [facility] doing well Over the last 2 weeks has had respiratory symptoms with sinus pressure and purulent discharge and occasional cough but no fever She has improved with her recurrence of sinus infections by taking Singulair  Review of Systems No known weight changes No headaches or vision changes No chest pain Palpitations and increased when she switched to the mylan brand of diltiazem and she does not have this taking [place] brand made by valeant  No shortness of breath/no edema/no paroxysmal nocturnal dyspnea No change in appetite-Dr. Loreta Ave treating GERD with Dexilant     Objective:   Physical Exam BP 128/82  Pulse 94  Temp(Src) 98.6 F (37 C) (Oral)  Resp 17  Ht 5\' 1"  (1.549 m)  Wt 152 lb (68.947 kg)  BMI 28.74 kg/m2  SpO2 96% In no acute distress Pupils equal reactive to light and accommodation Nares boggy with purulent discharge tender maxillary areas  to percussion Throat clear/no nodes Chest clear to auscultation Heart regular without murmur Extremities with full pulses and no edema Neurological intact mood and affect appropriate         Assessment & Plan:  Essential hypertension, benign - Plan: diltiazem (DILACOR XR) 120 MG 24 hr capsule, hydrochlorothiazide (HYDRODIURIL) 25 MG tablet  HTN (hypertension) - Plan: amLODipine (NORVASC) 10 MG tablet, CBC with Differential, Comprehensive metabolic panel  GERD (gastroesophageal reflux disease) - Plan: famotidine (PEPCID) 40 MG tablet, dexlansoprazole (DEXILANT) 60 MG capsule  Hyperlipemia - Plan: rosuvastatin (CRESTOR) 20 MG tablet, POCT glycosylated hemoglobin (Hb A1C), Lipid panel  AR (allergic rhinitis) - Plan: montelukast (SINGULAIR) 10 MG tablet, fluticasone (FLONASE) 50 MCG/ACT nasal spray  Insomnia - Plan: temazepam (RESTORIL) 30 MG capsule  Anxiety state, unspecified - Plan: diazepam (VALIUM) 10 MG tablet  Chronic neck pain - Plan: baclofen (LIORESAL) 20 MG tablet  Osteoarthritis of left knee  Sinusitis secondary to allergic rhinitis  Meds ordered this encounter  Medications  . diltiazem (DILACOR XR) 120 MG 24 hr capsule    Sig: Take 1 capsule (120 mg total) by mouth daily.    Dispense:  90 capsule    Refill:  3    Needs meds made by valeant not mylan  . diazepam (VALIUM) 10 MG tablet    Sig: TAKE 1 TABLET BY MOUTH EVERY 8 HOURS AS NEEDED FOR ANXIETY    Dispense:  270 tablet    Refill:  1    *NEEDS 90 DAYS SUPPLY  .  amLODipine (NORVASC) 10 MG tablet    Sig: Take 1 tablet (10 mg total) by mouth daily.    Dispense:  90 tablet    Refill:  3  . baclofen (LIORESAL) 20 MG tablet    Sig: TAKE 1 TABLET BY MOUTH AT BEDTIME AS NEEDED    Dispense:  90 tablet    Refill:  3  . famotidine (PEPCID) 40 MG tablet    Sig: Take 1 tablet (40 mg total) by mouth 2 (two) times daily.    Dispense:  180 tablet    Refill:  3  . rosuvastatin (CRESTOR) 20 MG tablet    Sig: Take 1  tablet (20 mg total) by mouth daily.    Dispense:  90 tablet    Refill:  1  . montelukast (SINGULAIR) 10 MG tablet    Sig: Take 1 tablet (10 mg total) by mouth at bedtime.    Dispense:  90 tablet    Refill:  3  . fluticasone (FLONASE) 50 MCG/ACT nasal spray    Sig: Place 2 sprays into both nostrils daily.    Dispense:  16 g    Refill:  5    Order Specific Question:  Supervising Provider    Answer:  Damarys Speir P [[id number]]  . temazepam (RESTORIL) 30 MG capsule    Sig: TAKE ONE CAPSULE BY MOUTH EVERY DAY AT BEDTIME AS NEEDED FOR SLEEP    Dispense:  30 capsule    Refill:  5  . dexlansoprazole (DEXILANT) 60 MG capsule    Sig: Take 1 capsule (60 mg total) by mouth daily.    Dispense:  90 capsule    Refill:  3  . hydrochlorothiazide (HYDRODIURIL) 25 MG tablet    Sig: Take 1 tablet (25 mg total) by mouth daily.    Dispense:  90 tablet    Refill:  3  . amoxicillin-clavulanate (AUGMENTIN) 875-125 MG per tablet    Sig: Take 1 tablet by mouth 2 (two) times daily.    Dispense:  20 tablet    Refill:  0  . traMADol-acetaminophen (ULTRACET) 37.5-325 MG per tablet    Sig: Take 1 tablet by mouth every 6 (six) hours as needed.    Dispense:  30 tablet    Refill:  5    routine labs  She requested all prescriptions to be printed

## 2013-02-19 ENCOUNTER — Other Ambulatory Visit: Payer: Self-pay

## 2013-02-19 ENCOUNTER — Encounter: Payer: Self-pay | Admitting: Internal Medicine

## 2013-02-19 NOTE — Telephone Encounter (Signed)
Received fax from pharm req 90 day supply of Restoril instead of 30 days. Dr Merla Richesoolittle OKd change to 90 day w/1RF and I faxed this note back to pharm.

## 2013-03-07 ENCOUNTER — Other Ambulatory Visit: Payer: Self-pay | Admitting: Physician Assistant

## 2013-05-21 ENCOUNTER — Other Ambulatory Visit: Payer: Self-pay | Admitting: Physician Assistant

## 2013-08-18 ENCOUNTER — Ambulatory Visit (INDEPENDENT_AMBULATORY_CARE_PROVIDER_SITE_OTHER): Payer: Federal, State, Local not specified - PPO | Admitting: Internal Medicine

## 2013-08-18 VITALS — BP 122/82 | HR 89 | Temp 98.8°F | Resp 16 | Ht 58.5 in | Wt 151.0 lb

## 2013-08-18 DIAGNOSIS — J45909 Unspecified asthma, uncomplicated: Secondary | ICD-10-CM

## 2013-08-18 DIAGNOSIS — J0100 Acute maxillary sinusitis, unspecified: Secondary | ICD-10-CM

## 2013-08-18 DIAGNOSIS — I10 Essential (primary) hypertension: Secondary | ICD-10-CM

## 2013-08-18 DIAGNOSIS — G4700 Insomnia, unspecified: Secondary | ICD-10-CM

## 2013-08-18 DIAGNOSIS — M549 Dorsalgia, unspecified: Secondary | ICD-10-CM

## 2013-08-18 DIAGNOSIS — M542 Cervicalgia: Secondary | ICD-10-CM

## 2013-08-18 DIAGNOSIS — J301 Allergic rhinitis due to pollen: Secondary | ICD-10-CM

## 2013-08-18 DIAGNOSIS — J4530 Mild persistent asthma, uncomplicated: Secondary | ICD-10-CM

## 2013-08-18 DIAGNOSIS — Z6831 Body mass index (BMI) 31.0-31.9, adult: Secondary | ICD-10-CM

## 2013-08-18 DIAGNOSIS — F411 Generalized anxiety disorder: Secondary | ICD-10-CM

## 2013-08-18 DIAGNOSIS — R002 Palpitations: Secondary | ICD-10-CM

## 2013-08-18 DIAGNOSIS — E785 Hyperlipidemia, unspecified: Secondary | ICD-10-CM

## 2013-08-18 DIAGNOSIS — J0101 Acute recurrent maxillary sinusitis: Secondary | ICD-10-CM

## 2013-08-18 MED ORDER — TEMAZEPAM 30 MG PO CAPS: ORAL_CAPSULE | ORAL | Status: DC

## 2013-08-18 MED ORDER — ROSUVASTATIN CALCIUM 20 MG PO TABS: 20.0000 mg | ORAL_TABLET | Freq: Every day | ORAL | Status: DC

## 2013-08-18 MED ORDER — DIAZEPAM 10 MG PO TABS: ORAL_TABLET | ORAL | Status: DC

## 2013-08-18 MED ORDER — TRAMADOL-ACETAMINOPHEN 37.5-325 MG PO TABS: 1.0000 | ORAL_TABLET | Freq: Four times a day (QID) | ORAL | Status: DC | PRN

## 2013-08-18 MED ORDER — AMOXICILLIN 875 MG PO TABS: 875.0000 mg | ORAL_TABLET | Freq: Two times a day (BID) | ORAL | Status: DC

## 2013-08-18 MED ORDER — FLUTICASONE PROPIONATE 50 MCG/ACT NA SUSP: 2.0000 | Freq: Every day | NASAL | Status: DC

## 2013-08-18 NOTE — Progress Notes (Signed)
Subjective:    Patient ID: Brooke Wilson, female    DOB: Oct 30, 1955, 58 y.o.   MRN: [id number]  HPI  Having palpitations for two months.  Feels like her body is tight.  Patient went to the [place].  Brother passed away of a massive heart attack at 53.  Has noticed her heart having rapid beats.  She is having dizziness.  Does not have shortness of breath.  Feels like her legs are tight.  Both legs were swollen when she returned home.  Has a massive cramp in the back of her neck and upper back.  She does not hurt in her chest.  She feels like it is muscle pain and she uses ice packs.  Allergic to diet soda.  Having nasal drip and sore throat after drinking diet soda.  Has not had the swelling since she arrived home.  Blood pressure does run high sometime.  She stopped the hctz two weeks ago and she takes it if her blood pressure is high.  Does not have any sleep problems.  Husband says that she does snore but has not mentioned her stopping breathing.   Also c/o sinus d/c w/ ST and nocturnal cough--hx rec sinusitis  Daughter preg in [place] awaiting internship just passed flex/om medicaid   Review of Systems  Constitutional: Negative for fever, fatigue and unexpected weight change.  HENT: Negative for trouble swallowing.   Eyes: Negative for visual disturbance.  Respiratory: Negative for shortness of breath.   Cardiovascular: Negative for chest pain, palpitations and leg swelling.  Gastrointestinal: Negative for nausea, vomiting, abdominal pain, diarrhea, constipation and rectal pain.  Genitourinary: Negative for dysuria, frequency, difficulty urinating and dyspareunia.  Musculoskeletal: Negative for back pain.  Skin: Negative for rash.  Neurological: Negative for tremors, weakness and headaches.  Hematological: Does not bruise/bleed easily.  Psychiatric/Behavioral: Negative for hallucinations, sleep disturbance, dysphoric mood and decreased concentration. The patient is not  nervous/anxious.        Lots of stress       Objective:   Physical Exam  Constitutional: She is oriented to person, place, and time. She appears well-developed and well-nourished. No distress.  HENT:  Mouth/Throat: Oropharynx is clear and moist.  Purulent d/c nares  Tender max to prec  Eyes: Conjunctivae and EOM are normal. Pupils are equal, round, and reactive to light.  Neck: No thyromegaly present.  Cardiovascular: Normal rate, regular rhythm, normal heart sounds and intact distal pulses.   No murmur heard. Pulmonary/Chest: Effort normal and breath sounds normal. No respiratory distress.  Abdominal: Soft. Bowel sounds are normal. She exhibits no distension and no mass. There is no tenderness.  Musculoskeletal: Normal range of motion. She exhibits no edema and no tenderness.  Tender paraspin cerv to low thorac  Lymphadenopathy:    She has no cervical adenopathy.  Neurological: She is alert and oriented to person, place, and time. She has normal reflexes. No cranial nerve deficit. Coordination normal.  Skin: No rash noted.  Psychiatric: She has a normal mood and affect. Her behavior is normal. Judgment normal.  Persists w/ self rx w/ meds from [place] like pred or antibio when she self dx          Assessment & Plan:  Anxiety state, unspecified - Plan: diazepam (VALIUM) 10 MG tablet  Hyperlipemia - Plan: rosuvastatin (CRESTOR) 20 MG tablet  Allergic rhinitis due to pollen - Plan: fluticasone (FLONASE) 50 MCG/ACT nasal spray  Insomnia - Plan: temazepam (RESTORIL) 30 MG capsule  Essential  hypertension, benign  RAD (reactive airway disease), mild persistent, uncomplicated  Mid back pain  Neck pain  Palpitations - Plan: Ambulatory referral to Cardiology  Acute recurrent maxillary sinusitis  BMI 31.0-31.9,adult  Meds ordered this encounter  Medications  . diazepam (VALIUM) 10 MG tablet    Sig: TAKE 1 TABLET BY MOUTH EVERY 8 HOURS AS NEEDED FOR ANXIETY     Dispense:  270 tablet    Refill:  1    *NEEDS 90 DAYS SUPPLY  . rosuvastatin (CRESTOR) 20 MG tablet    Sig: Take 1 tablet (20 mg total) by mouth daily.    Dispense:  90 tablet    Refill:  1  . fluticasone (FLONASE) 50 MCG/ACT nasal spray    Sig: Place 2 sprays into both nostrils daily.    Dispense:  16 g    Refill:  5    Order Specific Question:  Supervising Provider    Answer:  Daelynn Blower P [[id number]]  . temazepam (RESTORIL) 30 MG capsule    Sig: TAKE ONE CAPSULE BY MOUTH EVERY DAY AT BEDTIME AS NEEDED FOR SLEEP    Dispense:  30 capsule    Refill:  5  . traMADol-acetaminophen (ULTRACET) 37.5-325 MG per tablet    Sig: Take 1 tablet by mouth every 6 (six) hours as needed.    Dispense:  30 tablet    Refill:  5  . amoxicillin (AMOXIL) 875 MG tablet    Sig: Take 1 tablet (875 mg total) by mouth 2 (two) times daily.    Dispense:  20 tablet    Refill:  0   Add-labs 7/5 Results for orders placed in visit on 02/17/13  CBC WITH DIFFERENTIAL      Result Value Ref Range   WBC 6.5  4.0 - 10.5 K/uL   RBC 5.29 (*) 3.87 - 5.11 MIL/uL   Hemoglobin 14.1  12.0 - 15.0 g/dL   HCT 41.2  36.0 - 46.0 %   MCV 77.9 (*) 78.0 - 100.0 fL   MCH 26.7  26.0 - 34.0 pg   MCHC 34.2  30.0 - 36.0 g/dL   RDW 14.6  11.5 - 15.5 %   Platelets 296  150 - 400 K/uL   Neutrophils Relative % 59  43 - 77 %   Neutro Abs 3.9  1.7 - 7.7 K/uL   Lymphocytes Relative 30  12 - 46 %   Lymphs Abs 1.9  0.7 - 4.0 K/uL   Monocytes Relative 7  3 - 12 %   Monocytes Absolute 0.4  0.1 - 1.0 K/uL   Eosinophils Relative 3  0 - 5 %   Eosinophils Absolute 0.2  0.0 - 0.7 K/uL   Basophils Relative 1  0 - 1 %   Basophils Absolute 0.1  0.0 - 0.1 K/uL   Smear Review Criteria for review not met    COMPREHENSIVE METABOLIC PANEL      Result Value Ref Range   Sodium 136  135 - 145 mEq/L   Potassium 3.5  3.5 - 5.3 mEq/L   Chloride 98  96 - 112 mEq/L   CO2 28  19 - 32 mEq/L   Glucose, Bld 100 (*) 70 - 99 mg/dL   BUN 18  6 - 23 mg/dL    Creat 0.72  0.50 - 1.10 mg/dL   Total Bilirubin 0.4  0.3 - 1.2 mg/dL   Alkaline Phosphatase 104  39 - 117 U/L   AST 16  0 - 37  U/L   ALT 11  0 - 35 U/L   Total Protein 7.6  6.0 - 8.3 g/dL   Albumin 4.6  3.5 - 5.2 g/dL   Calcium 9.9  8.4 - 10.5 mg/dL  LIPID PANEL      Result Value Ref Range   Cholesterol 181  0 - 200 mg/dL   Triglycerides 88  <150 mg/dL   HDL 69  >39 mg/dL   Total CHOL/HDL Ratio 2.6     VLDL 18  0 - 40 mg/dL   LDL Cholesterol 94  0 - 99 mg/dL  POCT GLYCOSYLATED HEMOGLOBIN (HGB A1C)      Result Value Ref Range   Hemoglobin A1C 5.9     F/u 68mo

## 2013-08-21 ENCOUNTER — Telehealth: Payer: Self-pay

## 2013-08-21 DIAGNOSIS — Z01419 Encounter for gynecological examination (general) (routine) without abnormal findings: Secondary | ICD-10-CM

## 2013-08-21 NOTE — Telephone Encounter (Signed)
Put in referral for OBGYN

## 2013-08-21 NOTE — Telephone Encounter (Signed)
Pt called inquiring about referral from Dr.Doolittle to an OBGYN

## 2013-08-31 ENCOUNTER — Other Ambulatory Visit: Payer: Self-pay | Admitting: Internal Medicine

## 2013-08-31 DIAGNOSIS — G4700 Insomnia, unspecified: Secondary | ICD-10-CM

## 2013-08-31 MED ORDER — TEMAZEPAM 30 MG PO CAPS: ORAL_CAPSULE | ORAL | Status: DC

## 2013-08-31 NOTE — Telephone Encounter (Signed)
I believe that these prescriptions were already addressed. If not, OK to send in a 30-day supply of each.

## 2013-08-31 NOTE — Telephone Encounter (Signed)
Resent in refill to pharmacy- pharmacy states they did not get the one sent at OV with dr. Merla Richesoolittle

## 2013-09-07 ENCOUNTER — Other Ambulatory Visit: Payer: Self-pay | Admitting: Internal Medicine

## 2013-09-07 DIAGNOSIS — G4700 Insomnia, unspecified: Secondary | ICD-10-CM

## 2013-09-07 NOTE — Telephone Encounter (Signed)
Pt called in for refills on diazepam (VALIUM) 10 MG tablet , rosuvastatin (CRESTOR), and  temazepam (RESTORIL) 30 MG capsule [[id number]][[id number]]

## 2013-09-10 MED ORDER — TEMAZEPAM 30 MG PO CAPS: ORAL_CAPSULE | ORAL | Status: DC

## 2013-09-10 NOTE — Telephone Encounter (Signed)
Called pharm and was advised that they do not have any current Rxs for valium or restoril (either from 08/18/13 OV, or from 08/31/13). Called pt who stated that she is sure that she had turned in both of these Rxs from OV to pharm but they have told her they do not have them on file. She stated she has checked through her house and could not find them there. Dr Merla Richesoolittle, do you want to approve the Rxs to be resent as written at your 08/18/13 OV? Pended.

## 2013-09-10 NOTE — Telephone Encounter (Signed)
Faxed and notified pt

## 2013-09-13 ENCOUNTER — Telehealth: Payer: Self-pay

## 2013-09-13 NOTE — Telephone Encounter (Signed)
PA needed for temazepam 30 mg. Completed over the phone, approved through 09/13/2014 - good for generic and name brand Restoril. Notified pharmacy.

## 2013-11-30 ENCOUNTER — Other Ambulatory Visit: Payer: Self-pay

## 2013-12-18 ENCOUNTER — Other Ambulatory Visit: Payer: Self-pay

## 2013-12-18 DIAGNOSIS — Z1231 Encounter for screening mammogram for malignant neoplasm of breast: Secondary | ICD-10-CM

## 2013-12-31 ENCOUNTER — Ambulatory Visit (INDEPENDENT_AMBULATORY_CARE_PROVIDER_SITE_OTHER): Payer: Federal, State, Local not specified - PPO | Admitting: Internal Medicine

## 2013-12-31 VITALS — BP 130/82 | HR 85 | Temp 98.2°F | Resp 16 | Ht 59.75 in | Wt 149.8 lb

## 2013-12-31 DIAGNOSIS — K2100 Gastro-esophageal reflux disease with esophagitis, without bleeding: Secondary | ICD-10-CM

## 2013-12-31 DIAGNOSIS — R05 Cough: Secondary | ICD-10-CM

## 2013-12-31 DIAGNOSIS — J0101 Acute recurrent maxillary sinusitis: Secondary | ICD-10-CM

## 2013-12-31 DIAGNOSIS — R059 Cough, unspecified: Secondary | ICD-10-CM

## 2013-12-31 DIAGNOSIS — J301 Allergic rhinitis due to pollen: Secondary | ICD-10-CM

## 2013-12-31 DIAGNOSIS — F411 Generalized anxiety disorder: Secondary | ICD-10-CM

## 2013-12-31 DIAGNOSIS — J4530 Mild persistent asthma, uncomplicated: Secondary | ICD-10-CM

## 2013-12-31 MED ORDER — HYDROCOD POLST-CHLORPHEN POLST 10-8 MG/5ML PO LQCR: 5.0000 mL | Freq: Two times a day (BID) | ORAL | Status: DC | PRN

## 2013-12-31 MED ORDER — DIAZEPAM 10 MG PO TABS: ORAL_TABLET | ORAL | Status: DC

## 2013-12-31 MED ORDER — AMOXICILLIN-POT CLAVULANATE 875-125 MG PO TABS: 1.0000 | ORAL_TABLET | Freq: Two times a day (BID) | ORAL | Status: DC

## 2013-12-31 MED ORDER — FLUTICASONE PROPIONATE 50 MCG/ACT NA SUSP: 2.0000 | Freq: Every day | NASAL | Status: DC

## 2013-12-31 MED ORDER — LATANOPROST 0.005 % OP SOLN: OPHTHALMIC | Status: DC

## 2013-12-31 MED ORDER — FAMOTIDINE 40 MG PO TABS: 40.0000 mg | ORAL_TABLET | Freq: Two times a day (BID) | ORAL | Status: DC

## 2013-12-31 NOTE — Progress Notes (Signed)
Subjective:  This chart was scribed for Meha Vidrine, MD by Haywood PaoNadim Abu Hashem, ED Scribe at [facility].The patient was seen in exam room 11 and the patient's care was started at 7:00 PM.   Patient ID: Brooke BreamEvangeline Wilson, female    DOB: May 01, 1955, 58 y.o.   MRN: [id number] Chief Complaint  Patient presents with  . Sore Throat    x3 days  . Generalized Body Aches    x3 days  . Fever    101.3 this afternoon  . Cough    Productive- "white" worse at night x1 week   . Medication Refill    Norvasc, Valium, Temazepam...   HPI HPI Comments: Brooke Wilson is a 58 y.o. female [facility] throat, generalized body aches, fever, productive cough, and sinus drainage 3d. Pt states she is unable to sleep due to her cough. Purulent sinus drainage. Her fever started today. She notes augmentin has worked very well for her in past and prefers it.  Pt has gotten the flu shot this year. Works as Engineer, civil (consulting)nurse. Daughter pregnant  Pharm telling her she needs certain refills We called to verify since epic says all good til 02/2014  Review of Systems  Constitutional: Positive for fever.  HENT: Positive for sinus pressure and sore throat.   Respiratory: Positive for cough.   Musculoskeletal: Positive for myalgias.  Psychiatric/Behavioral: Positive for sleep disturbance.       Objective:  BP 130/82 mmHg  Pulse 85  Temp(Src) 98.2 F (36.8 C) (Oral)  Resp 16  Ht 4' 11.75" (1.518 m)  Wt 149 lb 12.8 oz (67.949 kg)  BMI 29.49 kg/m2  SpO2 98%  Physical Exam  Constitutional: She is oriented to person, place, and time. She appears well-developed and well-nourished.  HENT:  Head: Normocephalic and atraumatic.  Nose: Purulent discharge bilaterally.  Throat slightly inflammed  Eyes: EOM are normal.  Neck: Normal range of motion.  Cardiovascular: Normal rate.   Pulmonary/Chest: Effort normal.  Lungs sound clear.  Lymphadenopathy:    She has no  cervical adenopathy.  Neurological: She is alert and oriented to person, place, and time.  Skin: Skin is warm and dry.  Psychiatric: She has a normal mood and affect. Her behavior is normal.  Nursing note and vitals reviewed.      Assessment & Plan:  I personally performed the services described in this documentation, which was scribed in my presence. The recorded information has been reviewed and is accurate.  Acute recurrent maxillary sinusitis  Gastroesophageal reflux disease with esophagitis - Plan: famotidine (PEPCID) 40 MG tablet  Allergic rhinitis due to pollen - Plan: fluticasone (FLONASE) 50 MCG/ACT nasal spray  RAD (reactive airway disease), mild persistent, uncomplicated  Cough  Anxiety state  Meds ordered this encounter  Medications  . amoxicillin-clavulanate (AUGMENTIN) 875-125 MG per tablet    Sig: Take 1 tablet by mouth 2 (two) times daily.    Dispense:  14 tablet    Refill:  0  . diazepam (VALIUM) 10 MG tablet    Sig: TAKE 1 TABLET BY MOUTH EVERY 8 HOURS AS NEEDED FOR ANXIETY    Dispense:  270 tablet    Refill:  0  . famotidine (PEPCID) 40 MG tablet    Sig: Take 1 tablet (40 mg total) by mouth 2 (two) times daily.    Dispense:  180 tablet    Refill:  0  . latanoprost (XALATAN) 0.005 % ophthalmic solution  Sig: PLACE 1 DROP INTO BOTH EYES 2 (TWO) TIMES DAILY. DISPENSE 90 DAY SUPPLY    Dispense:  15 mL    Refill:  3  . fluticasone (FLONASE) 50 MCG/ACT nasal spray    Sig: Place 2 sprays into both nostrils daily.    Dispense:  16 g    Refill:  5  . chlorpheniramine-HYDROcodone (TUSSIONEX PENNKINETIC ER) 10-8 MG/5ML LQCR    Sig: Take 5 mLs by mouth every 12 (twelve) hours as needed for cough.    Dispense:  115 mL    Refill:  0

## 2014-01-04 ENCOUNTER — Ambulatory Visit
Admission: RE | Admit: 2014-01-04 | Discharge: 2014-01-04 | Disposition: A | Discharge status: Home / Self Care | Payer: Federal, State, Local not specified - PPO | Source: Ambulatory Visit

## 2014-01-04 DIAGNOSIS — Z1231 Encounter for screening mammogram for malignant neoplasm of breast: Secondary | ICD-10-CM

## 2014-01-08 ENCOUNTER — Other Ambulatory Visit: Payer: Self-pay | Admitting: Internal Medicine

## 2014-02-23 ENCOUNTER — Other Ambulatory Visit: Payer: Self-pay | Admitting: Internal Medicine

## 2014-03-02 ENCOUNTER — Other Ambulatory Visit: Payer: Self-pay | Admitting: Internal Medicine

## 2014-03-03 ENCOUNTER — Other Ambulatory Visit: Payer: Self-pay | Admitting: Internal Medicine

## 2014-03-17 ENCOUNTER — Ambulatory Visit (INDEPENDENT_AMBULATORY_CARE_PROVIDER_SITE_OTHER): Payer: Federal, State, Local not specified - PPO | Admitting: Internal Medicine

## 2014-03-17 ENCOUNTER — Telehealth: Payer: Self-pay

## 2014-03-17 VITALS — BP 142/80 | HR 81 | Temp 97.5°F | Resp 18 | Ht 59.0 in | Wt 151.6 lb

## 2014-03-17 DIAGNOSIS — J4521 Mild intermittent asthma with (acute) exacerbation: Secondary | ICD-10-CM

## 2014-03-17 DIAGNOSIS — I10 Essential (primary) hypertension: Secondary | ICD-10-CM

## 2014-03-17 DIAGNOSIS — G8929 Other chronic pain: Secondary | ICD-10-CM

## 2014-03-17 DIAGNOSIS — J301 Allergic rhinitis due to pollen: Secondary | ICD-10-CM

## 2014-03-17 DIAGNOSIS — M542 Cervicalgia: Secondary | ICD-10-CM

## 2014-03-17 DIAGNOSIS — E785 Hyperlipidemia, unspecified: Secondary | ICD-10-CM

## 2014-03-17 DIAGNOSIS — J4530 Mild persistent asthma, uncomplicated: Secondary | ICD-10-CM

## 2014-03-17 DIAGNOSIS — K219 Gastro-esophageal reflux disease without esophagitis: Secondary | ICD-10-CM

## 2014-03-17 DIAGNOSIS — F411 Generalized anxiety disorder: Secondary | ICD-10-CM

## 2014-03-17 DIAGNOSIS — K2100 Gastro-esophageal reflux disease with esophagitis, without bleeding: Secondary | ICD-10-CM

## 2014-03-17 DIAGNOSIS — Z6831 Body mass index (BMI) 31.0-31.9, adult: Secondary | ICD-10-CM

## 2014-03-17 DIAGNOSIS — G4700 Insomnia, unspecified: Secondary | ICD-10-CM

## 2014-03-17 DIAGNOSIS — Z0000 Encounter for general adult medical examination without abnormal findings: Secondary | ICD-10-CM

## 2014-03-17 DIAGNOSIS — J309 Allergic rhinitis, unspecified: Secondary | ICD-10-CM

## 2014-03-17 LAB — POCT CBC
GRANULOCYTE PERCENT: 61.1 % (ref 37–80)
HCT, POC: 41.1 % (ref 37.7–47.9)
Hemoglobin: 13.4 g/dL (ref 12.2–16.2)
Lymph, poc: 2.2 (ref 0.6–3.4)
MCH, POC: 27.1 pg (ref 27–31.2)
MCHC: 32.6 g/dL (ref 31.8–35.4)
MCV: 83.1 fL (ref 80–97)
MID (CBC): 0.2 (ref 0–0.9)
MPV: 7.7 fL (ref 0–99.8)
PLATELET COUNT, POC: 265 10*3/uL (ref 142–424)
POC Granulocyte: 3.7 (ref 2–6.9)
POC LYMPH PERCENT: 35.5 %L (ref 10–50)
POC MID %: 3.4 %M (ref 0–12)
RBC: 4.94 M/uL (ref 4.04–5.48)
RDW, POC: 14.4 %
WBC: 6.1 10*3/uL (ref 4.6–10.2)

## 2014-03-17 LAB — COMPLETE METABOLIC PANEL WITH GFR
ALK PHOS: 92 U/L (ref 39–117)
ALT: 13 U/L (ref 0–35)
AST: 16 U/L (ref 0–37)
Albumin: 4.2 g/dL (ref 3.5–5.2)
BILIRUBIN TOTAL: 0.4 mg/dL (ref 0.2–1.2)
BUN: 22 mg/dL (ref 6–23)
CALCIUM: 9.6 mg/dL (ref 8.4–10.5)
CO2: 31 mEq/L (ref 19–32)
CREATININE: 0.68 mg/dL (ref 0.50–1.10)
Chloride: 101 mEq/L (ref 96–112)
GFR, Est African American: >89 mL/min
GFR, Est Non African American: >89 mL/min
Glucose, Bld: 100 mg/dL — ABNORMAL HIGH (ref 70–99)
POTASSIUM: 2.9 meq/L — AB (ref 3.5–5.3)
SODIUM: 141 meq/L (ref 135–145)
TOTAL PROTEIN: 7.1 g/dL (ref 6.0–8.3)

## 2014-03-17 LAB — LIPID PANEL
Cholesterol: 163 mg/dL (ref 0–200)
HDL: 62 mg/dL (ref >39)
LDL CALC: 79 mg/dL (ref 0–99)
TRIGLYCERIDES: 108 mg/dL (ref <150)
Total CHOL/HDL Ratio: 2.6 Ratio
VLDL: 22 mg/dL (ref 0–40)

## 2014-03-17 MED ORDER — AMOXICILLIN-POT CLAVULANATE 875-125 MG PO TABS: 1.0000 | ORAL_TABLET | Freq: Two times a day (BID) | ORAL | Status: DC

## 2014-03-17 MED ORDER — FAMOTIDINE 40 MG PO TABS: 40.0000 mg | ORAL_TABLET | Freq: Two times a day (BID) | ORAL | Status: DC

## 2014-03-17 MED ORDER — MONTELUKAST SODIUM 10 MG PO TABS: 10.0000 mg | ORAL_TABLET | Freq: Every day | ORAL | Status: DC

## 2014-03-17 MED ORDER — HYDROCHLOROTHIAZIDE 25 MG PO TABS: 25.0000 mg | ORAL_TABLET | Freq: Every day | ORAL | Status: DC

## 2014-03-17 MED ORDER — ROSUVASTATIN CALCIUM 20 MG PO TABS: 20.0000 mg | ORAL_TABLET | Freq: Every day | ORAL | Status: DC

## 2014-03-17 MED ORDER — BACLOFEN 20 MG PO TABS
ORAL_TABLET | ORAL | Status: DC
Start: 2014-03-17 — End: 2014-11-18

## 2014-03-17 MED ORDER — OMEPRAZOLE 40 MG PO CPDR
40.0000 mg | DELAYED_RELEASE_CAPSULE | Freq: Every day | ORAL | Status: DC
Start: 2014-03-17 — End: 2015-02-17

## 2014-03-17 MED ORDER — DILTIAZEM HCL ER COATED BEADS 120 MG PO CP24: 120.0000 mg | ORAL_CAPSULE | Freq: Every day | ORAL | Status: DC

## 2014-03-17 MED ORDER — DIAZEPAM 10 MG PO TABS: ORAL_TABLET | ORAL | Status: DC

## 2014-03-17 MED ORDER — TEMAZEPAM 30 MG PO CAPS: ORAL_CAPSULE | ORAL | Status: DC

## 2014-03-17 MED ORDER — AMLODIPINE BESYLATE 10 MG PO TABS: 10.0000 mg | ORAL_TABLET | Freq: Every day | ORAL | Status: DC

## 2014-03-17 MED ORDER — FLUTICASONE-SALMETEROL 250-50 MCG/DOSE IN AEPB: INHALATION_SPRAY | RESPIRATORY_TRACT | Status: DC

## 2014-03-17 MED ORDER — ALBUTEROL SULFATE HFA 108 (90 BASE) MCG/ACT IN AERS: INHALATION_SPRAY | RESPIRATORY_TRACT | Status: DC

## 2014-03-17 MED ORDER — FLUTICASONE PROPIONATE 50 MCG/ACT NA SUSP: 2.0000 | Freq: Every day | NASAL | Status: DC

## 2014-03-17 NOTE — Telephone Encounter (Signed)
Patient requesting a refill on "Diltiazem" please send to [facility] on [address] and her call back number [phone]

## 2014-03-17 NOTE — Progress Notes (Addendum)
Subjective:  This chart was scribed for Brooke Sia, MD by [name], ED Scribe at [facility].The patient was seen in exam room 04 and the patient's care was started at 9:26 AM.   Patient ID: Brooke Wilson, female    DOB: 07-14-1955, 59 y.o.   MRN: [id number] Chief Complaint  Patient presents with  . Annual Exam  . Sore Throat   HPI HPI Comments: Brooke Wilson is a 59 y.o. female who presents to [facility] for an annual exam. Pt need most of her medications refilled. She is UTD on her mammogram.   Pt reports of leg cramping primarily at night. Intermittent. Usually sleeps well with Restoril. Recent allergy attack with wheezing so she restarted Advair. Over the last week she has become more congested with purulent sinus drainage.   Patient Active Problem List   Diagnosis Date Noted  . Mid back pain--intermittent  02/17/2012  . Neck pain--intermittent  02/17/2012  . RAD (reactive airway disease) 02/17/2012  . AR (allergic rhinitis) 02/17/2012  . Dental disease--- cared for  02/17/2012  . GERD (gastroesophageal reflux disease)--- response to meds //dexilant too expensive  05/17/2011  . Anxiety state--intermittent Valium  05/17/2011  . Insomnia, unspecified--- Restoril  05/17/2011  . Essential hypertension, benign----response to meds  03/23/2011  . Cough----secondary to reactive airway  03/23/2011  . Hyperlipemia 03/23/2011  . BMI 31.0-31.9,adult----understands the need for weight loss  03/23/2011   In general she is doing well with no complaints today feels like her general health is good. [id number] mammo last mo  Past Surgical History  Procedure Laterality Date  . Vaginal hysterectomy    . Tubal ligation     Allergies  Allergen Reactions  . Ace Inhibitors Cough  . Flexeril [Cyclobenzaprine Hcl] Nausea Only  . Lisinopril Cough  . Naprosyn [Naproxen] Nausea And Vomiting    And high doses of NSAIDS  . Sulfa Antibiotics   . Bactrim Rash    Prior to Admission medications   Medication Sig Start Date End Date Taking? Authorizing Provider  ADVAIR DISKUS 250-50 MCG/DOSE AEPB INHALE 1 PUFF INTO THE LUNGS EVERY 12 (TWELVE) HOURS. 03/07/13  Yes Tonye Pearson, MD  albuterol (PROVENTIL HFA;VENTOLIN HFA) 108 (90 BASE) MCG/ACT inhaler Inhale 2 puffs into the lungs every 6 (six) hours as needed for wheezing or shortness of breath. Need office visit for additional refills. 02/16/12  Yes Tonye Pearson, MD  amLODipine (NORVASC) 10 MG tablet Take 1 tablet (10 mg total) by mouth daily. DUE FOR FOLLOW UP IN FEB 03/04/14  Yes Tonye Pearson, MD  amoxicillin-clavulanate (AUGMENTIN) 875-125 MG per tablet Take 1 tablet by mouth 2 (two) times daily. 12/31/13  Yes Tonye Pearson, MD  aspirin 81 MG tablet Take 81 mg by mouth daily.   Yes Historical Provider, MD  baclofen (LIORESAL) 20 MG tablet TAKE 1 TABLET BY MOUTH AT BEDTIME AS NEEDED 02/17/13  Yes Tonye Pearson, MD  chlorpheniramine-HYDROcodone [facility]) 10-8 MG/5ML LQCR Take 5 mLs by mouth every 12 (twelve) hours as needed for cough. 12/31/13  Yes Tonye Pearson, MD  dexlansoprazole (DEXILANT) 60 MG capsule Take 1 capsule (60 mg total) by mouth daily. 02/17/13  Yes Tonye Pearson, MD  diazepam (VALIUM) 10 MG tablet TAKE 1 TABLET BY MOUTH EVERY 8 HOURS AS NEEDED FOR ANXIETY 12/31/13  Yes Tonye Pearson, MD  diltiazem (CARDIZEM CD) 120 MG 24 hr capsule Take 1 capsule (120 mg total) by mouth  daily. PATIENT NEEDS OFFICE VISIT FOR ADDITIONAL REFILLS 03/05/14  Yes Tonye Pearson, MD  famotidine (PEPCID) 40 MG tablet Take 1 tablet (40 mg total) by mouth 2 (two) times daily. 12/31/13  Yes Tonye Pearson, MD  fluticasone (FLONASE) 50 MCG/ACT nasal spray Place 2 sprays into both nostrils daily. 12/31/13  Yes Tonye Pearson, MD  hydrochlorothiazide (HYDRODIURIL) 25 MG tablet TAKE 1 TABLET BY MOUTH EVERY DAY 02/24/14  Yes Tonye Pearson, MD  ibuprofen  (ADVIL,MOTRIN) 800 MG tablet TAKE 1 TABLET BY MOUTH THREE TIMES DAILY 01/08/14  Yes Tonye Pearson, MD  latanoprost (XALATAN) 0.005 % ophthalmic solution PLACE 1 DROP INTO BOTH EYES 2 (TWO) TIMES DAILY. DISPENSE 90 DAY SUPPLY 12/31/13  Yes Tonye Pearson, MD  montelukast (SINGULAIR) 10 MG tablet TAKE 1 TABLET BY MOUTH AT BEDTIME 03/04/14  Yes Tonye Pearson, MD  PROVENTIL HFA 108 (90 BASE) MCG/ACT inhaler INHALE 2 PUFFS INTO THE LUNGS EVERY 6 HOURS AS NEEDED FOR WHEEZING OR SHORTNESS OF BREATH 03/07/13  Yes Tonye Pearson, MD  rosuvastatin (CRESTOR) 20 MG tablet Take 1 tablet (20 mg total) by mouth daily. 08/18/13  Yes Tonye Pearson, MD  temazepam (RESTORIL) 30 MG capsule TAKE ONE CAPSULE BY MOUTH EVERY DAY AT BEDTIME AS NEEDED FOR SLEEP 09/10/13  Yes Tonye Pearson, MD  traMADol-acetaminophen (ULTRACET) 37.5-325 MG per tablet Take 1 tablet by mouth every 6 (six) hours as needed. 08/18/13  Yes Tonye Pearson, MD  zinc sulfate 220 MG capsule Take 220 mg by mouth daily.   Yes Historical Provider, MD  hydrochlorothiazide (HYDRODIURIL) 25 MG tablet Take 1 tablet (25 mg total) by mouth daily. 02/17/13 02/17/14  Tonye Pearson, MD   History   Social History  . Marital Status: Married    Spouse Name: Sotero    Number of Children: 3  . Years of Education: Nursing degree   Occupational History  . Not on file.   Social History Main Topics  . Smoking status: Never Smoker   . Smokeless tobacco: Never Used     Review of Systems  HENT: Positive for sore throat.   Musculoskeletal: Positive for myalgias.   the remainder of a 14 point review of system is negative via form     Objective:  BP 142/80 mmHg  Pulse 81  Temp(Src) 97.5 F (36.4 C) (Oral)  Resp 18  Ht  (1.499 m)  Wt 151 lb 9.6 oz (68.765 kg)  BMI 30.60 kg/m2  SpO2 94%  Physical Exam  Constitutional: She is oriented to person, place, and time. She appears well-developed and well-nourished. No distress.    HENT:  Head: Normocephalic and atraumatic.  Right Ear: External ear normal.  Left Ear: External ear normal.  Nose: Nose normal.  Mouth/Throat: Oropharynx is clear and moist.  Eyes: Conjunctivae and EOM are normal. Pupils are equal, round, and reactive to light.  Neck: Normal range of motion. Neck supple. No thyromegaly present.  Cardiovascular: Normal rate, regular rhythm, normal heart sounds and intact distal pulses.   No murmur heard. Pulmonary/Chest: Effort normal and breath sounds normal. No respiratory distress. She has no wheezes.  Abdominal: Soft. Bowel sounds are normal. She exhibits no distension and no mass. There is no tenderness. There is no rebound.  Musculoskeletal: Normal range of motion. She exhibits no edema or tenderness.  Lymphadenopathy:    She has no cervical adenopathy.  Neurological: She is alert and oriented to person, place, and time. She  has normal reflexes. No cranial nerve deficit.  Skin: Skin is warm and dry. No rash noted.  Psychiatric: She has a normal mood and affect. Her behavior is normal. Judgment and thought content normal.  Nursing note and vitals reviewed. BP 142/80 mmHg  Pulse 81  Temp(Src) 97.5 F (36.4 C) (Oral)  Resp 18  Ht  (1.499 m)  Wt 151 lb 9.6 oz (68.765 kg)  BMI 30.60 kg/m2  SpO2 94%      Assessment & Plan:  Annual physical exam  Hyperlipidemia -  Essential hypertension -  Allergic rhinitis, unspecified allergic rhinitis type -  Insomnia -  GAD (generalized anxiety disorder) -  Gastroesophageal reflux disease without esophagitis -  Essential hypertension, benign -  BMI 31.0-31.9,adult  RAD (reactive airway disease), mild persistent, uncomplicated  Chronic neck pain - Plan: baclofen (LIORESAL) 20 MG tablet  Meds ordered this encounter  Medications  . temazepam (RESTORIL) 30 MG capsule    Sig: TAKE ONE CAPSULE BY MOUTH EVERY DAY AT BEDTIME AS NEEDED FOR SLEEP    Dispense:  90 capsule    Refill:  3  .  rosuvastatin (CRESTOR) 20 MG tablet    Sig: Take 1 tablet (20 mg total) by mouth daily.    Dispense:  90 tablet    Refill:  3  . albuterol (PROVENTIL HFA) 108 (90 BASE) MCG/ACT inhaler    Sig: INHALE 2 PUFFS INTO THE LUNGS EVERY 6 HOURS AS NEEDED FOR WHEEZING OR SHORTNESS OF BREATH    Dispense:  6.7 each    Refill:  5  . montelukast (SINGULAIR) 10 MG tablet    Sig: Take 1 tablet (10 mg total) by mouth at bedtime.    Dispense:  90 tablet    Refill:  3  . hydrochlorothiazide (HYDRODIURIL) 25 MG tablet    Sig: Take 1 tablet (25 mg total) by mouth daily.    Dispense:  90 tablet    Refill:  3  . fluticasone (FLONASE) 50 MCG/ACT nasal spray    Sig: Place 2 sprays into both nostrils daily.    Dispense:  16 g    Refill:  5  . famotidine (PEPCID) 40 MG tablet    Sig: Take 1 tablet (40 mg total) by mouth 2 (two) times daily.    Dispense:  180 tablet    Refill:  3  . diltiazem (CARDIZEM CD) 120 MG 24 hr capsule    Sig: Take 1 capsule (120 mg total) by mouth daily.    Dispense:  90 capsule    Refill:  3  . diazepam (VALIUM) 10 MG tablet    Sig: TAKE 1 TABLET BY MOUTH EVERY 8 HOURS AS NEEDED FOR ANXIETY    Dispense:  270 tablet    Refill:  0  . baclofen (LIORESAL) 20 MG tablet    Sig: TAKE 1 TABLET BY MOUTH AT BEDTIME AS NEEDED    Dispense:  90 tablet    Refill:  3  . amLODipine (NORVASC) 10 MG tablet    Sig: Take 1 tablet (10 mg total) by mouth daily.    Dispense:  90 tablet    Refill:  3  . Fluticasone-Salmeterol (ADVAIR DISKUS) 250-50 MCG/DOSE AEPB    Sig: INHALE 1 PUFF INTO THE LUNGS EVERY 12 (TWELVE) HOURS.    Dispense:  60 each    Refill:  5  . amoxicillin-clavulanate (AUGMENTIN) 875-125 MG per tablet    Sig: Take 1 tablet by mouth 2 (two) times daily.  Dispense:  20 tablet    Refill:  0    -  omeprazole 40 mg #90 with 3 refills  Labs pending    I have completed the patient encounter in its entirety as documented by the scribe, with editing by me where  necessary. Chassidy Layson P. Merla Richesoolittle, M.D.

## 2014-03-20 ENCOUNTER — Encounter: Payer: Self-pay | Admitting: Internal Medicine

## 2014-03-20 NOTE — Telephone Encounter (Signed)
3 mo supply w/ 3 refills sent 03/17/14

## 2014-03-20 NOTE — Telephone Encounter (Signed)
Tried to call patient to let her know Rx sent in. No voicemail set up.

## 2014-03-28 ENCOUNTER — Other Ambulatory Visit: Payer: Self-pay | Admitting: Internal Medicine

## 2014-04-09 ENCOUNTER — Other Ambulatory Visit: Payer: Self-pay | Admitting: Internal Medicine

## 2014-04-10 NOTE — Telephone Encounter (Signed)
Faxed

## 2014-04-18 ENCOUNTER — Telehealth: Payer: Self-pay

## 2014-04-18 NOTE — Telephone Encounter (Signed)
Pt wants a CB concerning her lab work. She would also like her results sent to her. Please advise at [phone] (M)

## 2014-04-18 NOTE — Telephone Encounter (Signed)
Patient called again asking for her lab results. States that it has been over a month since her visit. When will her labs be ready? Please call patient. Please note that I have made this a high priority message since patient was seen on 03/17/2014 and is still waiting on someone to deliver her lab results.   [phone]

## 2014-04-19 NOTE — Telephone Encounter (Signed)
Lab letter was sent 03/20/14.

## 2014-04-19 NOTE — Telephone Encounter (Signed)
Copied off labs and put them with her daughters Rx.

## 2014-04-19 NOTE — Telephone Encounter (Signed)
Called pt, unable to leave message.

## 2014-04-19 NOTE — Telephone Encounter (Signed)
Tried to call pt to let her know this, cell VM not set up. Home # is [facility]

## 2014-04-19 NOTE — Telephone Encounter (Signed)
Keep calling/even at work and go ahead and mail a copy--or print a copy of the letter for her to pick up when she comes to get daughter Sagi's adderall

## 2014-04-20 NOTE — Telephone Encounter (Signed)
Pt left 2 messages on the lab VM. Tried to CB to see if she needed anything else. Unable to LM

## 2014-04-21 ENCOUNTER — Telehealth: Payer: Self-pay

## 2014-04-21 NOTE — Telephone Encounter (Signed)
Pt has been looking at her lab notes and wants a CB regarding her potassium levels. Please advise at [phone]

## 2014-04-23 NOTE — Telephone Encounter (Signed)
Spoke with pt, she would like to half her Rx of HCTZ. She would also like to take a supplement because she wants her potassium to be at least a 4. Please send in both Rx's for pt. She will plan to recheck levels. When should she RTC for lab? Please advise.

## 2014-04-23 NOTE — Telephone Encounter (Signed)
Tried to call. VM not set up.

## 2014-04-23 NOTE — Telephone Encounter (Signed)
This is due to: Prior to Admission medications   hydrochlorothiazide (HYDRODIURIL) 25 MG tablet TAKE 1 TABLET BY MOUTH EVERY DAY 02/24/14   Tonye Pearsonobert P Lenvil Swaim, MD  She could start on a potassium supplement which we could call in a 10 mEq a day, or she could switch from table salt to potassium iodine salt which she can buy the grocery store, or we can decrease her h- Diuril dose to 12.5 mg which should correct the potassium loss.

## 2014-04-23 NOTE — Telephone Encounter (Signed)
Pt is concerned about her potassium being low.  Wants to know if she should be on a potassium supplement? It's been more than a month, if you want to recheck it, can it be a lab only? Please advise. Thanks

## 2014-04-24 MED ORDER — HYDROCHLOROTHIAZIDE 12.5 MG PO CAPS: 12.5000 mg | ORAL_CAPSULE | Freq: Every day | ORAL | Status: DC

## 2014-04-24 MED ORDER — POTASSIUM CHLORIDE ER 10 MEQ PO TBCR: 10.0000 meq | EXTENDED_RELEASE_TABLET | Freq: Every day | ORAL | Status: DC

## 2014-04-24 NOTE — Telephone Encounter (Signed)
Call in microzide 12.5 mg #90 1 daily and potassium #90 1 qd  labs in 2 mos

## 2014-04-24 NOTE — Telephone Encounter (Signed)
Sent in Rx's. Called pt to let her know. No Vm set up.

## 2014-04-25 NOTE — Telephone Encounter (Signed)
Called pt again, not able to leave message.

## 2014-05-01 ENCOUNTER — Telehealth: Payer: Self-pay

## 2014-05-01 ENCOUNTER — Other Ambulatory Visit: Payer: Self-pay

## 2014-05-01 MED ORDER — POTASSIUM CHLORIDE ER 8 MEQ PO TBCR: 8.0000 meq | EXTENDED_RELEASE_TABLET | Freq: Every day | ORAL | Status: DC

## 2014-05-01 NOTE — Telephone Encounter (Signed)
Dr Cleta Albertsaub helped me call in her potassium 10 meq. Pt states she can only take Klor-con because the other make her sick. Rx sent in.

## 2014-05-03 ENCOUNTER — Other Ambulatory Visit: Payer: Self-pay | Admitting: Radiology

## 2014-05-03 ENCOUNTER — Other Ambulatory Visit: Payer: Self-pay

## 2014-05-03 DIAGNOSIS — E876 Hypokalemia: Secondary | ICD-10-CM

## 2014-05-03 MED ORDER — POTASSIUM CHLORIDE ER 10 MEQ PO TBCR: 10.0000 meq | EXTENDED_RELEASE_TABLET | Freq: Every day | ORAL | Status: DC

## 2014-05-20 ENCOUNTER — Ambulatory Visit (INDEPENDENT_AMBULATORY_CARE_PROVIDER_SITE_OTHER): Payer: Federal, State, Local not specified - PPO | Admitting: Internal Medicine

## 2014-05-20 VITALS — BP 132/74 | HR 83 | Temp 98.1°F | Resp 16 | Ht 59.5 in | Wt 151.2 lb

## 2014-05-20 DIAGNOSIS — R739 Hyperglycemia, unspecified: Secondary | ICD-10-CM | POA: Diagnosis not present

## 2014-05-20 DIAGNOSIS — M549 Dorsalgia, unspecified: Secondary | ICD-10-CM

## 2014-05-20 DIAGNOSIS — E876 Hypokalemia: Secondary | ICD-10-CM | POA: Diagnosis not present

## 2014-05-20 DIAGNOSIS — J302 Other seasonal allergic rhinitis: Secondary | ICD-10-CM | POA: Diagnosis not present

## 2014-05-20 DIAGNOSIS — M542 Cervicalgia: Secondary | ICD-10-CM | POA: Diagnosis not present

## 2014-05-20 DIAGNOSIS — I10 Essential (primary) hypertension: Secondary | ICD-10-CM

## 2014-05-20 MED ORDER — AMOXICILLIN-POT CLAVULANATE 875-125 MG PO TABS: 1.0000 | ORAL_TABLET | Freq: Two times a day (BID) | ORAL | Status: DC

## 2014-05-20 MED ORDER — TRAMADOL-ACETAMINOPHEN 37.5-325 MG PO TABS: 1.0000 | ORAL_TABLET | Freq: Four times a day (QID) | ORAL | Status: DC | PRN

## 2014-05-20 NOTE — Progress Notes (Addendum)
Subjective:  This chart was scribed for Brooke Sia, MD by [facility], medical scribe at [facility].The patient was seen in exam room 09 and the patient's care was started at 8:46 PM.   Patient ID: Brooke Wilson, female    DOB: September 14, 1955, 59 y.o.   MRN: [id number] Chief Complaint  Patient presents with  . Follow-up  . Sore Throat   HPI HPI Comments: Brooke Wilson is a 59 y.o. female who presents to [facility] for a follow up. She needs a recheck for her A1C and potassium from Jan visit. She has been taking Klor-Con 20 mEq a day in addition to her hydrochlorothiazide 25 mg as she could not swallow the 12.5 mg Microzide.   She would also like a medication refill for her neck pain. 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  Pt also complains of a sore throat worse at night, with an associated post nasal drip, she is producing a yellow phlegm. She is currently taking Singulair, Flonase. Pt denies wheezing. Has an asthma inhaler for reactive airway disease but has not had to use it.  Patient Active Problem List   Diagnosis Date Noted  . Mid back pain 02/17/2012  . Neck pain 02/17/2012  . RAD (reactive airway disease) 02/17/2012  . AR (allergic rhinitis) 02/17/2012  . Dental disease 02/17/2012  . GERD (gastroesophageal reflux disease) 05/17/2011  . Anxiety state 05/17/2011  . Insomnia 05/17/2011  . Essential hypertension, benign 03/23/2011  . Cough 03/23/2011  . Hyperlipemia 03/23/2011  . BMI 31.0-31.9,adult 03/23/2011   Past Medical History  Diagnosis Date  . Hypertension   . Hyperlipidemia   . History of measles, mumps, or rubella   . H/O varicella    Past Surgical History  Procedure Laterality Date  . Vaginal hysterectomy    . Tubal ligation     Allergies  Allergen Reactions  . Ace Inhibitors Cough  . Flexeril [Cyclobenzaprine Hcl] Nausea Only  . Lisinopril Cough  . Naprosyn  [Naproxen] Nausea And Vomiting    And high doses of NSAIDS  . Sulfa Antibiotics   . Bactrim Rash   Prior to Admission medications   Medication Sig Start Date End Date Taking? Authorizing Provider  albuterol (PROVENTIL HFA) 108 (90 BASE) MCG/ACT inhaler INHALE 2 PUFFS INTO THE LUNGS EVERY 6 HOURS AS NEEDED FOR WHEEZING OR SHORTNESS OF BREATH 03/17/14  Yes Tonye Pearson, MD  albuterol (PROVENTIL HFA;VENTOLIN HFA) 108 (90 BASE) MCG/ACT inhaler Inhale 2 puffs into the lungs every 6 (six) hours as needed for wheezing or shortness of breath. Need office visit for additional refills. 02/16/12  Yes Tonye Pearson, MD  amLODipine (NORVASC) 10 MG tablet Take 1 tablet (10 mg total) by mouth daily. 03/17/14  Yes Tonye Pearson, MD  aspirin 81 MG tablet Take 81 mg by mouth daily.   Yes Historical Provider, MD  baclofen (LIORESAL) 20 MG tablet TAKE 1 TABLET BY MOUTH AT BEDTIME AS NEEDED 03/17/14  Yes Tonye Pearson, MD  chlorpheniramine-HYDROcodone [facility]) 10-8 MG/5ML LQCR Take 5 mLs by mouth every 12 (twelve) hours as needed for cough. 12/31/13  Yes Tonye Pearson, MD  dexlansoprazole (DEXILANT) 60 MG capsule Take 1 capsule (60 mg total) by mouth daily. 02/17/13  Yes Tonye Pearson, MD  diazepam (VALIUM) 10 MG tablet TAKE 1 TABLET BY MOUTH EVERY 8 HOURS AS NEEDED FOR ANXIETY 03/17/14  Yes Harrel Lemon  Merla Riches, MD  diltiazem (CARDIZEM CD) 120 MG 24 hr capsule Take 1 capsule (120 mg total) by mouth daily. 03/17/14  Yes Tonye Pearson, MD  famotidine (PEPCID) 40 MG tablet Take 1 tablet (40 mg total) by mouth 2 (two) times daily. 03/17/14  Yes Tonye Pearson, MD  fluticasone (FLONASE) 50 MCG/ACT nasal spray Place 2 sprays into both nostrils daily. 03/17/14  Yes Tonye Pearson, MD  Fluticasone-Salmeterol (ADVAIR DISKUS) 250-50 MCG/DOSE AEPB INHALE 1 PUFF INTO THE LUNGS EVERY 12 (TWELVE) HOURS. 03/17/14  Yes Tonye Pearson, MD  hydrochlorothiazide (HYDRODIURIL) 25 MG  tablet TAKE 1 TABLET BY MOUTH EVERY DAY 02/24/14  Yes Tonye Pearson, MD  hydrochlorothiazide (HYDRODIURIL) 25 MG tablet Take 1 tablet (25 mg total) by mouth daily. 03/17/14 03/17/15 Yes Tonye Pearson, MD  hydrochlorothiazide (MICROZIDE) 12.5 MG capsule Take 1 capsule (12.5 mg total) by mouth daily. 04/24/14  Yes Tonye Pearson, MD  ibuprofen (ADVIL,MOTRIN) 800 MG tablet TAKE 1 TABLET BY MOUTH THREE TIMES DAILY 01/08/14  Yes Tonye Pearson, MD  latanoprost (XALATAN) 0.005 % ophthalmic solution PLACE 1 DROP INTO BOTH EYES 2 (TWO) TIMES DAILY. DISPENSE 90 DAY SUPPLY 12/31/13  Yes Tonye Pearson, MD  montelukast (SINGULAIR) 10 MG tablet Take 1 tablet (10 mg total) by mouth at bedtime. 03/17/14  Yes Tonye Pearson, MD  omeprazole (PRILOSEC) 40 MG capsule Take 1 capsule (40 mg total) by mouth daily. 03/17/14  Yes Tonye Pearson, MD  potassium chloride (KLOR-CON 10) 10 MEQ tablet Take 1 tablet (10 mEq total) by mouth daily. 05/03/14  Yes Collene Gobble, MD  rosuvastatin (CRESTOR) 20 MG tablet Take 1 tablet (20 mg total) by mouth daily. 03/17/14  Yes Tonye Pearson, MD  temazepam (RESTORIL) 30 MG capsule TAKE ONE CAPSULE BY MOUTH EVERY DAY AT BEDTIME AS NEEDED FOR SLEEP 03/17/14  Yes Tonye Pearson, MD  traMADol-acetaminophen (ULTRACET) 37.5-325 MG per tablet TAKE 1 TABLET BY MOUTH EVERY 6 HOURS AS NEEDED 04/09/14  Yes Tonye Pearson, MD  zinc sulfate 220 MG capsule Take 220 mg by mouth daily.   Yes Historical Provider, MD   Review of Systems  HENT: Positive for postnasal drip and sore throat.   Respiratory: Negative for wheezing.   Musculoskeletal: Positive for neck pain.      Objective:  BP 132/74 mmHg  Pulse 83  Temp(Src) 98.1 F (36.7 C) (Oral)  Resp 16  Ht 4' 11.5" (1.511 m)  Wt 151 lb 3.2 oz (68.584 kg)  BMI 30.04 kg/m2  SpO2 97%  Physical Exam  Constitutional: She is oriented to person, place, and time. She appears well-developed and well-nourished. No  distress.  HENT:  Head: Normocephalic and atraumatic.  Right Ear: External ear normal.  Left Ear: External ear normal.  Mouth/Throat: Oropharynx is clear and moist.  Purulent nasal discharge Tender maxillary areas to percussion  Eyes: EOM are normal. Pupils are equal, round, and reactive to light.  Neck: Normal range of motion. No thyromegaly present.  Cardiovascular: Normal rate.   Pulmonary/Chest: Effort normal. No respiratory distress.  Musculoskeletal: Normal range of motion. She exhibits no edema.  Neurological: She is alert and oriented to person, place, and time.  Skin: Skin is warm and dry.  Psychiatric: She has a normal mood and affect. Her behavior is normal.  Nursing note and vitals reviewed.     Assessment & Plan:  Hypokalemia - Plan: Basic metabolic panel,   Hyperglycemia - Plan: Hemoglobin A1c,  Essential hypertension, benign  Mid back pain  Neck pain  Other seasonal allergic rhinitis--- relapse of sinus infection secondary  Meds ordered this encounter  Medications  . amoxicillin-clavulanate (AUGMENTIN) 875-125 MG per tablet    Sig: Take 1 tablet by mouth 2 (two) times daily.    Dispense:  20 tablet    Refill:  0  . traMADol-acetaminophen (ULTRACET) 37.5-325 MG per tablet    Sig: Take 1 tablet by mouth every 6 (six) hours as needed.    Dispense:  30 tablet    Refill:  1    This request is for a new prescription for a controlled substance as required by Federal/State law..   Can call in Potassium supplements depending on repeat labs    I have completed the patient encounter in its entirety as documented by the scribe, with editing by me where necessary. Trinetta Alemu P. Merla Richesoolittle, M.D.  Addendum labs Persistent hypokalemia despite the addition of potassium raises the question of an intrarenal etiology, particularly since she has had low potassium off and on for a few years although never hypernatremia We'll refer to nephrology

## 2014-05-21 LAB — BASIC METABOLIC PANEL
BUN: 17 mg/dL (ref 6–23)
CO2: 24 mEq/L (ref 19–32)
Calcium: 9.9 mg/dL (ref 8.4–10.5)
Chloride: 98 mEq/L (ref 96–112)
Creat: 0.58 mg/dL (ref 0.50–1.10)
Glucose, Bld: 93 mg/dL (ref 70–99)
POTASSIUM: 3.3 meq/L — AB (ref 3.5–5.3)
Sodium: 135 mEq/L (ref 135–145)

## 2014-05-21 LAB — HEMOGLOBIN A1C
Hgb A1c MFr Bld: 6.2 % — ABNORMAL HIGH (ref <5.7)
MEAN PLASMA GLUCOSE: 131 mg/dL — AB (ref <117)

## 2014-05-27 ENCOUNTER — Encounter: Payer: Self-pay | Admitting: Internal Medicine

## 2014-05-27 NOTE — Addendum Note (Signed)
Addended by: Tonye PearsonOLITTLE, ROBERT P on: 05/27/2014 01:46 PM   Modules accepted: Orders

## 2014-06-06 ENCOUNTER — Telehealth: Payer: Self-pay

## 2014-06-06 NOTE — Telephone Encounter (Signed)
She is listed as being on diltiazem and norvasc and should only be on one of those!!! Continue the potassium supplement for now Could break hctz 25 in half After doing those 3 things we can reck potass -1st week may

## 2014-06-06 NOTE — Telephone Encounter (Signed)
Pt called about labs. Let her know the MyChart message.  Patient has been reviewed by [facility] and is rated a 4 so it will be at least 2-3 months before an appt can be scheduled --message from Referrals What should pt do about potassium in the meantime?

## 2014-06-07 NOTE — Telephone Encounter (Signed)
Pt.notified

## 2014-06-09 ENCOUNTER — Other Ambulatory Visit: Payer: Self-pay | Admitting: Internal Medicine

## 2014-06-10 ENCOUNTER — Other Ambulatory Visit: Payer: Self-pay | Admitting: Internal Medicine

## 2014-06-13 ENCOUNTER — Telehealth: Payer: Self-pay

## 2014-06-13 NOTE — Telephone Encounter (Signed)
Rx faxed

## 2014-06-14 ENCOUNTER — Other Ambulatory Visit: Payer: Self-pay | Admitting: Internal Medicine

## 2014-08-05 ENCOUNTER — Telehealth: Payer: Self-pay

## 2014-08-05 DIAGNOSIS — E876 Hypokalemia: Secondary | ICD-10-CM

## 2014-08-05 NOTE — Telephone Encounter (Signed)
Pt would like the Dr to put an order in for her to have her potassium level check, would like to come in this evening if possible please call [phone]

## 2014-08-05 NOTE — Telephone Encounter (Signed)
2 follow-up for hypokalemia recheck

## 2014-08-05 NOTE — Telephone Encounter (Signed)
Letter no she can come in tonight to have her potassium rechecked

## 2014-08-06 NOTE — Telephone Encounter (Signed)
Ok. Called pt to let her know. No voicemail to leave message.

## 2014-08-07 NOTE — Telephone Encounter (Signed)
Called pt again and no voicemail. If she call back advise her she can come in to get her labs drawn. The order is in.

## 2014-08-08 ENCOUNTER — Other Ambulatory Visit (INDEPENDENT_AMBULATORY_CARE_PROVIDER_SITE_OTHER): Payer: Federal, State, Local not specified - PPO | Admitting: *Deleted

## 2014-08-08 DIAGNOSIS — E876 Hypokalemia: Secondary | ICD-10-CM

## 2014-08-08 NOTE — Progress Notes (Signed)
Lab only for BMP

## 2014-08-09 LAB — BASIC METABOLIC PANEL
BUN: 19 mg/dL (ref 6–23)
CALCIUM: 8.9 mg/dL (ref 8.4–10.5)
CO2: 25 meq/L (ref 19–32)
Chloride: 110 mEq/L (ref 96–112)
Creat: 1.14 mg/dL — ABNORMAL HIGH (ref 0.50–1.10)
GLUCOSE: 102 mg/dL — AB (ref 70–99)
POTASSIUM: 4 meq/L (ref 3.5–5.3)
Sodium: 144 mEq/L (ref 135–145)

## 2014-08-16 ENCOUNTER — Ambulatory Visit (INDEPENDENT_AMBULATORY_CARE_PROVIDER_SITE_OTHER): Payer: Federal, State, Local not specified - PPO | Admitting: Internal Medicine

## 2014-08-16 VITALS — BP 136/84 | HR 75 | Temp 98.2°F | Resp 18 | Ht 59.5 in | Wt 152.0 lb

## 2014-08-16 DIAGNOSIS — J301 Allergic rhinitis due to pollen: Secondary | ICD-10-CM | POA: Diagnosis not present

## 2014-08-16 DIAGNOSIS — J0101 Acute recurrent maxillary sinusitis: Secondary | ICD-10-CM | POA: Diagnosis not present

## 2014-08-16 MED ORDER — AMOXICILLIN-POT CLAVULANATE 875-125 MG PO TABS: 1.0000 | ORAL_TABLET | Freq: Two times a day (BID) | ORAL | Status: DC

## 2014-08-16 MED ORDER — PREDNISONE 20 MG PO TABS
ORAL_TABLET | ORAL | Status: DC
Start: 2014-08-16 — End: 2014-11-18

## 2014-08-16 NOTE — Patient Instructions (Signed)
  Current outpatient prescriptions:  .  albuterol (PROVENTIL HFA) 108 (90 BASE) MCG/ACT inhaler, INHALE 2 PUFFS INTO THE LUNGS EVERY 6 HOURS AS NEEDED FOR WHEEZING OR SHORTNESS OF BREATH, Disp: 6.7 each, Rfl: 5 .  amLODipine (NORVASC) 10 MG tablet, Take 1 tablet (10 mg total) by mouth daily., Disp: 90 tablet, Rfl: 3 .  aspirin 81 MG tablet, Take 81 mg by mouth daily., Disp: , Rfl:  .  baclofen (LIORESAL) 20 MG tablet, TAKE 1 TABLET BY MOUTH AT BEDTIME AS NEEDED, Disp: 90 tablet, Rfl: 3 .  diazepam (VALIUM) 10 MG tablet, TAKE 1 TABLET EVERY 8 HOURS AS NEEDED FOR ANXIETY, Disp: 270 tablet, Rfl: 0 .  diltiazem (CARDIZEM CD) 120 MG 24 hr capsule, Take 1 capsule (120 mg total) by mouth daily., Disp: 90 capsule, Rfl: 3 .  famotidine (PEPCID) 40 MG tablet, Take 1 tablet (40 mg total) by mouth 2 (two) times daily., Disp: 180 tablet, Rfl: 3 .  fluticasone (FLONASE) 50 MCG/ACT nasal spray, Place 2 sprays into both nostrils daily., Disp: 16 g, Rfl: 5 .  Fluticasone-Salmeterol (ADVAIR DISKUS) 250-50 MCG/DOSE AEPB, INHALE 1 PUFF INTO THE LUNGS EVERY 12 (TWELVE) HOURS., Disp: 60 each, Rfl: 5 .  hydrochlorothiazide (HYDRODIURIL) 25 MG tablet, TAKE 1 TABLET BY MOUTH EVERY DAY, Disp: 90 tablet, Rfl: 0 .  ibuprofen (ADVIL,MOTRIN) 800 MG tablet, TAKE 1 TABLET BY MOUTH 3 TIMES A DAY, Disp: 270 tablet, Rfl: 1 .  latanoprost (XALATAN) 0.005 % ophthalmic solution, PLACE 1 DROP INTO BOTH EYES 2 (TWO) TIMES DAILY. DISPENSE 90 DAY SUPPLY, Disp: 15 mL, Rfl: 3 .  montelukast (SINGULAIR) 10 MG tablet, Take 1 tablet (10 mg total) by mouth at bedtime., Disp: 90 tablet, Rfl: 3 .  omeprazole (PRILOSEC) 40 MG capsule, Take 1 capsule (40 mg total) by mouth daily., Disp: 90 capsule, Rfl: 3 .  rosuvastatin (CRESTOR) 20 MG tablet, Take 1 tablet (20 mg total) by mouth daily., Disp: 90 tablet, Rfl: 3 .  temazepam (RESTORIL) 30 MG capsule, TAKE ONE CAPSULE BY MOUTH EVERY DAY AT BEDTIME AS NEEDED FOR SLEEP, Disp: 90 capsule, Rfl: 3 .   traMADol-acetaminophen (ULTRACET) 37.5-325 MG per tablet, Take 1 tablet by mouth every 6 (six) hours as needed., Disp: 30 tablet, Rfl: 1 .  zinc sulfate 220 MG capsule, Take 220 mg by mouth daily., Disp: , Rfl:

## 2014-08-16 NOTE — Progress Notes (Signed)
Subjective:  This chart was scribed for [name], MD by Stann Ore, Medical Scribe. This patient was seen in Room 3 and the patient's care was started at 8:43 AM.     Patient ID: Brooke Wilson, female    DOB: 10-19-1955, 59 y.o.   MRN: [id number]  HPI Brooke Wilson is a 59 y.o. female who presents to [facility] complaining of a sore throat with associated nasal congestion starting a week ago. About 2-3 weeks ago she had another flare of her allergic rhinitis. Despite her chronic medicines although she takes them intermittently. She is also coughing, sneezing and has some eye itchiness. She is still taking singulair and she has tried taking benadryl, but symptoms have not yet resolved. She still uses her inhaler when needed. She has a history of progression to prolonged reactive airway disease or bronchitis. She has frequent sinusitis associated with this.  Note recent hypokalemia. She has been on potassium with some GI distress. Her hydrochlorothiazide was changed to half a dose at 12.5 mg now. She has been on 20 mEq of potassium as well. Her recent labs show potassium of 4.   Patient Active Problem List   Diagnosis Date Noted  . Mid back pain 02/17/2012  . Neck pain 02/17/2012  . RAD (reactive airway disease) 02/17/2012  . AR (allergic rhinitis) 02/17/2012  . Dental disease 02/17/2012  . GERD (gastroesophageal reflux disease) 05/17/2011  . Anxiety state 05/17/2011  . Insomnia 05/17/2011  . Essential hypertension, benign 03/23/2011  . Cough 03/23/2011  . Hyperlipemia 03/23/2011  . BMI 31.0-31.9,adult 03/23/2011    Current outpatient prescriptions:  .  albuterol (PROVENTIL HFA) 108 (90 BASE) MCG/ACT inhaler, INHALE 2 PUFFS INTO THE LUNGS EVERY 6 HOURS AS NEEDED FOR WHEEZING OR SHORTNESS OF BREATH, Disp: 6.7 each, Rfl: 5 .  amLODipine (NORVASC) 10 MG tablet, Take 1 tablet (10 mg total) by mouth daily., Disp: 90 tablet, Rfl: 3 .  aspirin 81 MG tablet, Take 81 mg by  mouth daily., Disp: , Rfl:  .  baclofen (LIORESAL) 20 MG tablet, TAKE 1 TABLET BY MOUTH AT BEDTIME AS NEEDED, Disp: 90 tablet, Rfl: 3 .  chlorpheniramine-HYDROcodone (TUSSIONEX PENNKINETIC ER) 10-8 MG/5ML LQCR, Take 5 mLs by mouth every 12 (twelve) hours as needed for cough., Disp: 115 mL, Rfl: 0 .  diazepam (VALIUM) 10 MG tablet, TAKE 1 TABLET EVERY 8 HOURS AS NEEDED FOR ANXIETY, Disp: 270 tablet, Rfl: 0 .  diltiazem (CARDIZEM CD) 120 MG 24 hr capsule, Take 1 capsule (120 mg total) by mouth daily., Disp: 90 capsule, Rfl: 3 .  famotidine (PEPCID) 40 MG tablet, Take 1 tablet (40 mg total) by mouth 2 (two) times daily., Disp: 180 tablet, Rfl: 3 .  fluticasone (FLONASE) 50 MCG/ACT nasal spray, Place 2 sprays into both nostrils daily., Disp: 16 g, Rfl: 5 .  Fluticasone-Salmeterol (ADVAIR DISKUS) 250-50 MCG/DOSE AEPB, INHALE 1 PUFF INTO THE LUNGS EVERY 12 (TWELVE) HOURS., Disp: 60 each, Rfl: 5 .  hydrochlorothiazide (HYDRODIURIL) 25 MG tablet, TAKE 1 TABLET BY MOUTH EVERY DAY, Disp: 90 tablet, Rfl: 0 .  ibuprofen (ADVIL,MOTRIN) 800 MG tablet, TAKE 1 TABLET BY MOUTH 3 TIMES A DAY, Disp: 270 tablet, Rfl: 1 .  latanoprost (XALATAN) 0.005 % ophthalmic solution, PLACE 1 DROP INTO BOTH EYES 2 (TWO) TIMES DAILY. DISPENSE 90 DAY SUPPLY, Disp: 15 mL, Rfl: 3 .  montelukast (SINGULAIR) 10 MG tablet, Take 1 tablet (10 mg total) by mouth at bedtime., Disp: 90 tablet, Rfl: 3 .  omeprazole (  PRILOSEC) 40 MG capsule, Take 1 capsule (40 mg total) by mouth daily., Disp: 90 capsule, Rfl: 3 .  potassium chloride (KLOR-CON 10) 10 MEQ tablet, Take 1 tablet (10 mEq total) by mouth daily., Disp: 60 tablet, Rfl: 1 .  rosuvastatin (CRESTOR) 20 MG tablet, Take 1 tablet (20 mg total) by mouth daily., Disp: 90 tablet, Rfl: 3 .  temazepam (RESTORIL) 30 MG capsule, TAKE ONE CAPSULE BY MOUTH EVERY DAY AT BEDTIME AS NEEDED FOR SLEEP, Disp: 90 capsule, Rfl: 3 .  traMADol-acetaminophen (ULTRACET) 37.5-325 MG per tablet, Take 1 tablet by  mouth every 6 (six) hours as needed., Disp: 30 tablet, Rfl: 1 .  zinc sulfate 220 MG capsule, Take 220 mg by mouth daily., Disp: , Rfl:     Review of Systems  HENT: Positive for congestion and sneezing.   Eyes: Positive for itching.  Respiratory: Positive for cough.        Objective:   Physical Exam  Constitutional: She is oriented to person, place, and time. She appears well-developed and well-nourished. No distress.  HENT:  Head: Normocephalic and atraumatic.  Mouth/Throat: Oropharynx is clear and moist.  Boggy turbinates with purulent discharge  Eyes: EOM are normal. Pupils are equal, round, and reactive to light.  Neck: Neck supple.  Cardiovascular: Normal rate, regular rhythm and normal heart sounds.   Pulmonary/Chest: Effort normal and breath sounds normal. No respiratory distress. She has no wheezes. She has no rales.  Musculoskeletal: Normal range of motion.  Lymphadenopathy:    She has no cervical adenopathy.  Neurological: She is alert and oriented to person, place, and time.  Skin: Skin is warm and dry.  Psychiatric: She has a normal mood and affect. Her behavior is normal.  Nursing note and vitals reviewed.         Assessment & Plan:  Allergic rhinitis due to pollen  Recurrent maxillary sinusitis, unspecified chronicity  Hypokalemia now resolved--discontinue Klor-Con continue 12.5 high score thiazide and recheck labs in 3 months Meds ordered this encounter  Medications  . DISCONTD: KLOR-CON M10 10 MEQ tablet    Sig: Take 10 mEq by mouth daily.    Refill:  0  . predniSONE (DELTASONE) 20 MG tablet    Sig: 3/3/2/2/1/1 single daily dose for 6 days    Dispense:  12 tablet    Refill:  0  . amoxicillin-clavulanate (AUGMENTIN) 875-125 MG per tablet----she may start this prescription in 48 hours if not improving from steroid-induced and restarting all of her allergy protocol     Sig: Take 1 tablet by mouth 2 (two) times daily.    Dispense:  20 tablet     Refill:  0    I have completed the patient encounter in its entirety as documented by the scribe, with editing by me where necessary. Wandalee Klang P. Merla Richesoolittle, M.D.

## 2014-10-27 ENCOUNTER — Other Ambulatory Visit: Payer: Self-pay | Admitting: Internal Medicine

## 2014-10-29 NOTE — Telephone Encounter (Signed)
Called in.

## 2014-11-08 ENCOUNTER — Other Ambulatory Visit: Payer: Self-pay | Admitting: Internal Medicine

## 2014-11-08 NOTE — Telephone Encounter (Signed)
Dr Merla Riches, according to you last OV notes in July (copied/pasted): Note recent hypokalemia. She has been on potassium with some GI distress. Her hydrochlorothiazide was changed to half a dose at 12.5 mg now. She has been on 20 mEq of potassium as well. Her recent labs show potassium of 4.  I was not able to reach pt to verify what she has been taking, VM not set up, but called her pharm who reported that they do not have any Rx hx of 20 meq, and last RF for the 10 meq was on July 1. Do you want to change sig to 2 tabs QD or change the Rx to the 20 meq tablet?

## 2014-11-18 ENCOUNTER — Ambulatory Visit (INDEPENDENT_AMBULATORY_CARE_PROVIDER_SITE_OTHER): Payer: Federal, State, Local not specified - PPO | Admitting: Internal Medicine

## 2014-11-18 VITALS — BP 130/76 | HR 87 | Temp 98.2°F | Resp 16 | Ht 60.25 in | Wt 153.4 lb

## 2014-11-18 DIAGNOSIS — R748 Abnormal levels of other serum enzymes: Secondary | ICD-10-CM | POA: Diagnosis not present

## 2014-11-18 DIAGNOSIS — R739 Hyperglycemia, unspecified: Secondary | ICD-10-CM | POA: Diagnosis not present

## 2014-11-18 DIAGNOSIS — M542 Cervicalgia: Secondary | ICD-10-CM

## 2014-11-18 DIAGNOSIS — E876 Hypokalemia: Secondary | ICD-10-CM | POA: Diagnosis not present

## 2014-11-18 DIAGNOSIS — J0101 Acute recurrent maxillary sinusitis: Secondary | ICD-10-CM

## 2014-11-18 DIAGNOSIS — G8929 Other chronic pain: Secondary | ICD-10-CM

## 2014-11-18 DIAGNOSIS — R252 Cramp and spasm: Secondary | ICD-10-CM

## 2014-11-18 DIAGNOSIS — R7989 Other specified abnormal findings of blood chemistry: Secondary | ICD-10-CM

## 2014-11-18 LAB — POCT CBC
GRANULOCYTE PERCENT: 64.3 % (ref 37–80)
HCT, POC: 40.6 % (ref 37.7–47.9)
HEMOGLOBIN: 12.5 g/dL (ref 12.2–16.2)
LYMPH, POC: 2.2 (ref 0.6–3.4)
MCH, POC: 24.7 pg — AB (ref 27–31.2)
MCHC: 30.7 g/dL — AB (ref 31.8–35.4)
MCV: 80.3 fL (ref 80–97)
MID (cbc): 0.5 (ref 0–0.9)
MPV: 7.4 fL (ref 0–99.8)
PLATELET COUNT, POC: 265 10*3/uL (ref 142–424)
POC Granulocyte: 4.9 (ref 2–6.9)
POC LYMPH %: 29.3 % (ref 10–50)
POC MID %: 6.4 %M (ref 0–12)
RBC: 5.06 M/uL (ref 4.04–5.48)
RDW, POC: 14.8 %
WBC: 7.6 10*3/uL (ref 4.6–10.2)

## 2014-11-18 LAB — HEMOGLOBIN A1C: Hgb A1c MFr Bld: 6 % (ref 4.0–6.0)

## 2014-11-18 LAB — POCT GLYCOSYLATED HEMOGLOBIN (HGB A1C): HEMOGLOBIN A1C: 6

## 2014-11-18 MED ORDER — DILTIAZEM HCL ER COATED BEADS 120 MG PO CP24: 240.0000 mg | ORAL_CAPSULE | Freq: Every day | ORAL | Status: DC

## 2014-11-18 MED ORDER — AMOXICILLIN-POT CLAVULANATE 875-125 MG PO TABS: 1.0000 | ORAL_TABLET | Freq: Two times a day (BID) | ORAL | Status: DC

## 2014-11-18 MED ORDER — BACLOFEN 20 MG PO TABS: ORAL_TABLET | ORAL | Status: DC

## 2014-11-18 MED ORDER — POTASSIUM CHLORIDE ER 10 MEQ PO TBCR: 20.0000 meq | EXTENDED_RELEASE_TABLET | Freq: Every day | ORAL | Status: DC

## 2014-11-18 NOTE — Progress Notes (Signed)
Subjective:    Patient ID: Brooke Wilson, female    DOB: Jul 26, 1955, 59 y.o.   MRN: [id number] This chart was scribed for [name], MD by Jolene Provost, Medical Scribe. This patient was seen in Room 14 and the patient's care was started a 6:42 PM.  Chief Complaint  Patient presents with  . Sinusitis    sinus congestion since the weather has changed  . Sore Throat    feels that this is from the weather change and allergies  . Muscle Pain    cramps in her legs that start at the groin and run down her legs.  wants her potassium checked    HPI HPI Comments: Brooke Wilson is a 59 y.o. female who presents to [facility] complaining of cramps that start in her groin and radiate to her ankle, and last for roughly 10 minutes. They happen randomly throughout the day, though primarily at night. After the cramp subsides the pain can last for up to 48 hours. She started taking  of klor-Con daily 10 days ago, and she has not had cramps since that time. She is only taking 1/2 of her hydrochlorothiazide medication, 12.5mg .  She is also complaining of cough, sore throat and sinus congestion which she associates with her allergies. 5days. Hx recurrences-AR. She denies trouble breathing/wheezing. She has been using saline spray which has helped her sinuses clear.   Needs a few refills-probs stable otherwise Current Outpatient Prescriptions on File Prior to Visit  Medication Sig Dispense Refill  . albuterol (PROVENTIL HFA) 108 (90 BASE) MCG/ACT inhaler INHALE 2 PUFFS INTO THE LUNGS EVERY 6 HOURS AS NEEDED FOR WHEEZING OR SHORTNESS OF BREATH 6.7 each 5  . amLODipine (NORVASC) 10 MG tablet Take 1 tablet (10 mg total) by mouth daily. 90 tablet 3  . aspirin 81 MG tablet Take 81 mg by mouth daily.    . baclofen (LIORESAL) 20 MG tablet TAKE 1 TABLET BY MOUTH AT BEDTIME AS NEEDED 90 tablet 3  . diazepam (VALIUM) 10 MG tablet TAKE 1 TABLET EVERY 8 HOURS AS NEEDED FOR ANXIETY 270 tablet 0  .  diltiazem (CARDIZEM CD) 120 MG 24 hr capsule Take 1 capsule (120 mg total) by mouth daily. 90 capsule 3  . famotidine (PEPCID) 40 MG tablet Take 1 tablet (40 mg total) by mouth 2 (two) times daily. 180 tablet 3  . fluticasone (FLONASE) 50 MCG/ACT nasal spray Place 2 sprays into both nostrils daily. 16 g 5  . Fluticasone-Salmeterol (ADVAIR DISKUS) 250-50 MCG/DOSE AEPB INHALE 1 PUFF INTO THE LUNGS EVERY 12 (TWELVE) HOURS. 60 each 5  . hydrochlorothiazide (HYDRODIURIL) 25 MG tablet TAKE 1 TABLET BY MOUTH EVERY DAY 90 tablet 0  . ibuprofen (ADVIL,MOTRIN) 800 MG tablet TAKE 1 TABLET BY MOUTH 3 TIMES A DAY 270 tablet 1  . KLOR-CON 10 10 MEQ tablet TAKE 1 TABLET BY MOUTH EVERY DAY 90 tablet 0  . latanoprost (XALATAN) 0.005 % ophthalmic solution PLACE 1 DROP INTO BOTH EYES 2 (TWO) TIMES DAILY. DISPENSE 90 DAY SUPPLY 15 mL 3  . montelukast (SINGULAIR) 10 MG tablet Take 1 tablet (10 mg total) by mouth at bedtime. 90 tablet 3  . omeprazole (PRILOSEC) 40 MG capsule Take 1 capsule (40 mg total) by mouth daily. 90 capsule 3  . rosuvastatin (CRESTOR) 20 MG tablet Take 1 tablet (20 mg total) by mouth daily. 90 tablet 3  . temazepam (RESTORIL) 30 MG capsule TAKE ONE CAPSULE BY MOUTH EVERY DAY AT BEDTIME AS NEEDED FOR  SLEEP 90 capsule 3  . traMADol-acetaminophen (ULTRACET) 37.5-325 MG per tablet TAKE 1 TABLET BY MOUTH EVERY 6 HOURS AS NEEDED 30 tablet 1  . zinc sulfate 220 MG capsule Take 220 mg by mouth daily.    Marland Kitchen amoxicillin-clavulanate (AUGMENTIN) 875-125 MG per tablet Take 1 tablet by mouth 2 (two) times daily. (Patient not taking: Reported on 11/18/2014) 20 tablet 0  . predniSONE (DELTASONE) 20 MG tablet 3/3/2/2/1/1 single daily dose for 6 days (Patient not taking: Reported on 11/18/2014) 12 tablet 0   No current facility-administered medications on file prior to visit.    Review of Systems  Constitutional: Negative for fever and chills.  HENT: Positive for congestion, postnasal drip, rhinorrhea, sinus  pressure and sore throat.   Respiratory: Positive for cough. Negative for shortness of breath.   Musculoskeletal: Positive for myalgias. Negative for gait problem.       Objective:   Physical Exam  Constitutional: She is oriented to person, place, and time. She appears well-developed and well-nourished. No distress.  HENT:  Head: Normocephalic and atraumatic.  Right Ear: External ear normal.  Left Ear: External ear normal.  Mouth/Throat: Oropharynx is clear and moist.  purul d/c nares w/ sw turbs  Eyes: EOM are normal. Pupils are equal, round, and reactive to light.  Neck: Neck supple.  Cardiovascular: Normal rate, regular rhythm and normal heart sounds.   Pulmonary/Chest: Effort normal and breath sounds normal. No respiratory distress. She has no wheezes.  Musculoskeletal: Normal range of motion.  Neurological: She is alert and oriented to person, place, and time. No cranial nerve deficit.  Skin: Skin is warm and dry. She is not diaphoretic.  Psychiatric: She has a normal mood and affect. Her behavior is normal.  Nursing note and vitals reviewed.   Filed Vitals:   11/18/14 1811  BP: 130/76  Pulse: 87  Temp: 98.2 F (36.8 C)  TempSrc: Oral  Resp: 16  Height: 5' 0.25" (1.53 m)  Weight: 153 lb 6 oz (69.57 kg)  SpO2: 98%  A1c 6.0 CBC wnl    Assessment & Plan:  By signing my name below, I, Javier Docker, attest that this documentation has been prepared under the direction and in the presence of [name], MD. Electronically Signed: Javier Docker, ER Scribe. 11/18/2014. 6:42 PM. I have completed the patient encounter in its entirety as documented by the scribe, with editing by me where necessary. Danzig Macgregor P. Merla Riches, M.D.  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 potassium chloride (KLOR-CON 10) 10 MEQ tablet    Sig: Take 2 tablets (20 mEq total) by mouth daily.    Dispense:  180 tablet    Refill:  3  . diltiazem (CARDIZEM CD) 120 MG 24 hr capsule    Sig: Take 2 capsules (240 mg total) by mouth daily.    Dispense:  180 capsule    Refill:  3  . baclofen (LIORESAL) 20 MG tablet    Sig: TAKE 1 TABLET BY MOUTH AT BEDTIME AS NEEDED    Dispense:  90 tablet    Refill:  3  . amoxicillin-clavulanate (AUGMENTIN) 875-125 MG tablet    Sig: Take  1 tablet by mouth 2 (two) times daily.    Dispense:  20 tablet    Refill:  0   Renal eval for K ??

## 2014-11-19 LAB — COMPREHENSIVE METABOLIC PANEL
ALBUMIN: 4.3 g/dL (ref 3.6–5.1)
ALT: 14 U/L (ref 6–29)
AST: 16 U/L (ref 10–35)
Alkaline Phosphatase: 98 U/L (ref 33–130)
BILIRUBIN TOTAL: 0.4 mg/dL (ref 0.2–1.2)
BUN: 12 mg/dL (ref 7–25)
CHLORIDE: 103 mmol/L (ref 98–110)
CO2: 26 mmol/L (ref 20–31)
CREATININE: 0.55 mg/dL (ref 0.50–1.05)
Calcium: 9.9 mg/dL (ref 8.6–10.4)
Glucose, Bld: 85 mg/dL (ref 65–99)
Potassium: 3.8 mmol/L (ref 3.5–5.3)
SODIUM: 138 mmol/L (ref 135–146)
TOTAL PROTEIN: 7 g/dL (ref 6.1–8.1)

## 2014-11-19 LAB — TSH: TSH: 0.685 u[IU]/mL (ref 0.350–4.500)

## 2014-12-07 ENCOUNTER — Other Ambulatory Visit: Payer: Self-pay | Admitting: Internal Medicine

## 2014-12-16 ENCOUNTER — Encounter: Payer: Self-pay | Admitting: Family Medicine

## 2014-12-19 ENCOUNTER — Other Ambulatory Visit: Payer: Self-pay

## 2014-12-19 DIAGNOSIS — Z1231 Encounter for screening mammogram for malignant neoplasm of breast: Secondary | ICD-10-CM

## 2015-01-02 ENCOUNTER — Ambulatory Visit
Admission: RE | Admit: 2015-01-02 | Discharge: 2015-01-02 | Disposition: A | Discharge status: Home / Self Care | Payer: Federal, State, Local not specified - PPO | Source: Ambulatory Visit

## 2015-01-02 DIAGNOSIS — Z1231 Encounter for screening mammogram for malignant neoplasm of breast: Secondary | ICD-10-CM

## 2015-02-04 ENCOUNTER — Other Ambulatory Visit: Payer: Self-pay | Admitting: Internal Medicine

## 2015-02-17 ENCOUNTER — Ambulatory Visit (INDEPENDENT_AMBULATORY_CARE_PROVIDER_SITE_OTHER): Payer: Federal, State, Local not specified - PPO | Admitting: Internal Medicine

## 2015-02-17 VITALS — BP 148/83 | HR 98 | Temp 98.0°F | Resp 16 | Ht 60.0 in | Wt 157.0 lb

## 2015-02-17 DIAGNOSIS — J302 Other seasonal allergic rhinitis: Secondary | ICD-10-CM

## 2015-02-17 DIAGNOSIS — G8929 Other chronic pain: Secondary | ICD-10-CM

## 2015-02-17 DIAGNOSIS — K219 Gastro-esophageal reflux disease without esophagitis: Secondary | ICD-10-CM | POA: Diagnosis not present

## 2015-02-17 DIAGNOSIS — J301 Allergic rhinitis due to pollen: Secondary | ICD-10-CM | POA: Diagnosis not present

## 2015-02-17 DIAGNOSIS — E785 Hyperlipidemia, unspecified: Secondary | ICD-10-CM | POA: Diagnosis not present

## 2015-02-17 DIAGNOSIS — E876 Hypokalemia: Secondary | ICD-10-CM

## 2015-02-17 DIAGNOSIS — Z0000 Encounter for general adult medical examination without abnormal findings: Secondary | ICD-10-CM | POA: Diagnosis not present

## 2015-02-17 DIAGNOSIS — I10 Essential (primary) hypertension: Secondary | ICD-10-CM

## 2015-02-17 DIAGNOSIS — K2100 Gastro-esophageal reflux disease with esophagitis, without bleeding: Secondary | ICD-10-CM

## 2015-02-17 DIAGNOSIS — G4700 Insomnia, unspecified: Secondary | ICD-10-CM | POA: Diagnosis not present

## 2015-02-17 DIAGNOSIS — Z6831 Body mass index (BMI) 31.0-31.9, adult: Secondary | ICD-10-CM | POA: Diagnosis not present

## 2015-02-17 DIAGNOSIS — R739 Hyperglycemia, unspecified: Secondary | ICD-10-CM

## 2015-02-17 DIAGNOSIS — M542 Cervicalgia: Secondary | ICD-10-CM | POA: Diagnosis not present

## 2015-02-17 DIAGNOSIS — J4530 Mild persistent asthma, uncomplicated: Secondary | ICD-10-CM

## 2015-02-17 LAB — POCT GLYCOSYLATED HEMOGLOBIN (HGB A1C): HEMOGLOBIN A1C: 5.9

## 2015-02-17 MED ORDER — OMEPRAZOLE 40 MG PO CPDR: 40.0000 mg | DELAYED_RELEASE_CAPSULE | Freq: Every day | ORAL | Status: DC

## 2015-02-17 MED ORDER — DILTIAZEM HCL ER COATED BEADS 120 MG PO CP24: 120.0000 mg | ORAL_CAPSULE | Freq: Two times a day (BID) | ORAL | Status: DC

## 2015-02-17 MED ORDER — ROSUVASTATIN CALCIUM 20 MG PO TABS: ORAL_TABLET | ORAL | Status: DC

## 2015-02-17 MED ORDER — BACLOFEN 20 MG PO TABS: 20.0000 mg | ORAL_TABLET | Freq: Two times a day (BID) | ORAL | Status: DC

## 2015-02-17 MED ORDER — TEMAZEPAM 30 MG PO CAPS: ORAL_CAPSULE | ORAL | Status: DC

## 2015-02-17 MED ORDER — MONTELUKAST SODIUM 10 MG PO TABS: 10.0000 mg | ORAL_TABLET | Freq: Every day | ORAL | Status: DC

## 2015-02-17 MED ORDER — POTASSIUM CHLORIDE ER 10 MEQ PO TBCR: 30.0000 meq | EXTENDED_RELEASE_TABLET | Freq: Every day | ORAL | Status: DC

## 2015-02-17 MED ORDER — FLUTICASONE-SALMETEROL 250-50 MCG/DOSE IN AEPB: INHALATION_SPRAY | RESPIRATORY_TRACT | Status: DC

## 2015-02-17 MED ORDER — ALBUTEROL SULFATE HFA 108 (90 BASE) MCG/ACT IN AERS: INHALATION_SPRAY | RESPIRATORY_TRACT | Status: DC

## 2015-02-17 MED ORDER — FAMOTIDINE 40 MG PO TABS: 40.0000 mg | ORAL_TABLET | Freq: Two times a day (BID) | ORAL | Status: DC

## 2015-02-17 MED ORDER — IBUPROFEN 800 MG PO TABS: 800.0000 mg | ORAL_TABLET | Freq: Three times a day (TID) | ORAL | Status: DC

## 2015-02-17 MED ORDER — FLUTICASONE PROPIONATE 50 MCG/ACT NA SUSP: 2.0000 | Freq: Every day | NASAL | Status: DC

## 2015-02-17 MED ORDER — TRAMADOL-ACETAMINOPHEN 37.5-325 MG PO TABS: 1.0000 | ORAL_TABLET | Freq: Four times a day (QID) | ORAL | Status: DC | PRN

## 2015-02-17 MED ORDER — ZINC SULFATE 220 (50 ZN) MG PO CAPS: 220.0000 mg | ORAL_CAPSULE | Freq: Every day | ORAL | Status: DC

## 2015-02-17 MED ORDER — DIAZEPAM 10 MG PO TABS: ORAL_TABLET | ORAL | Status: DC

## 2015-02-17 NOTE — Progress Notes (Signed)
Subjective:  By signing my name below, I, Brooke Wilson, attest that this documentation has been prepared under the direction and in the presence of Brooke Sia, MD.  Electronically Signed: Andrew Au, ED Scribe. 02/17/2015. 7:28 PM.   Patient ID: Brooke Wilson, female    DOB: [birth year], 60 y.o.   MRN: [id number]  HPI  Chief Complaint  Patient presents with  . Annual Exam    CPE  . Allergies   HPI Comments: Brooke Wilson is a 61 y.o. female who presents to the [facility] for a physical.   She is still having neck pain and stiffness. She states she calls out about twice a month due to neck pain. She is still taking baclofen and had tried massages. She is planning to do FMLA but has not started the process. She has not tried PT.   She also reports allergy symptoms consisting of nasal congestion and sore throat.    Pt states she has occasional low leg cramps. She has been taking potassium 30 Meq with improvement to leg swelling. She has her potassium  checked every month either at [facility] or here. She last had testing 3 days ago.  She has been seeing Dr. Eliott Nine who has told her there are no problems with her kidneys.  Patient Active Problem List   Diagnosis Date Noted  . Mid back pain 02/17/2012  . Neck pain 02/17/2012  . RAD (reactive airway disease) 02/17/2012  . AR (allergic rhinitis) 02/17/2012  . Dental disease 02/17/2012  . GERD (gastroesophageal reflux disease) 05/17/2011  . Anxiety state 05/17/2011  . Insomnia 05/17/2011  . Essential hypertension, benign 03/23/2011  . Cough 03/23/2011  . Hyperlipemia 03/23/2011  . BMI 31.0-31.9,adult 03/23/2011   Past Medical History  Diagnosis Date  . Hypertension   . Hyperlipidemia   . History of measles, mumps, or rubella   . H/O varicella   . Allergy    Past Surgical History  Procedure Laterality Date  . Vaginal hysterectomy    . Tubal ligation     Allergies  Allergen Reactions  . Ace  Inhibitors Cough  . Flexeril [Cyclobenzaprine Hcl] Nausea Only  . Lisinopril Cough  . Naprosyn [Naproxen] Nausea And Vomiting    And high doses of NSAIDS  . Sulfa Antibiotics   . Bactrim Rash   Prior to Admission medications   Medication Sig Start Date End Date Taking? Authorizing Provider  albuterol (PROVENTIL HFA) 108 (90 BASE) MCG/ACT inhaler INHALE 2 PUFFS INTO THE LUNGS EVERY 6 HOURS AS NEEDED FOR WHEEZING OR SHORTNESS OF BREATH 03/17/14  Yes Tonye Pearson, MD  aspirin 81 MG tablet Take 81 mg by mouth daily.   Yes Historical Provider, MD  baclofen (LIORESAL) 20 MG tablet TAKE 1 TABLET BY MOUTH AT BEDTIME AS NEEDED 11/18/14 prn Yes Tonye Pearson, MD  diazepam (VALIUM) 10 MG tablet TAKE 1 TABLET EVERY 8 HOURS AS NEEDED FOR ANXIETY 06/11/14 prn Yes Tonye Pearson, MD  diltiazem (CARDIZEM CD) 120 MG 24 hr capsule Take 2 capsules (240 mg total) by mouth daily. Patient taking differently: Take 120 mg by mouth 2 (two) times daily.  11/18/14  Yes Tonye Pearson, MD  famotidine (PEPCID) 40 MG tablet Take 1 tablet (40 mg total) by mouth 2 (two) times daily. 03/17/14  Yes Tonye Pearson, MD  fluticasone (FLONASE) 50 MCG/ACT nasal spray Place 2 sprays into both nostrils daily. 03/17/14  Yes Tonye Pearson, MD  Fluticasone-Salmeterol (ADVAIR DISKUS)  250-50 MCG/DOSE AEPB INHALE 1 PUFF INTO THE LUNGS EVERY 12 (TWELVE) HOURS. 03/17/14  Yes Tonye Pearson, MD  ibuprofen (ADVIL,MOTRIN) 800 MG tablet TAKE 1 TABLET BY MOUTH 3 TIMES A DAY 06/10/14  Yes Tonye Pearson, MD  latanoprost (XALATAN) 0.005 % ophthalmic solution PLACE 1 DROP INTO BOTH EYES 2 (TWO) TIMES DAILY. DISPENSE 90 DAY SUPPLY 12/31/13  Yes Tonye Pearson, MD  montelukast (SINGULAIR) 10 MG tablet Take 1 tablet (10 mg total) by mouth at bedtime. 03/17/14  Yes Tonye Pearson, MD  omeprazole (PRILOSEC) 40 MG capsule Take 1 capsule (40 mg total) by mouth daily. 03/17/14  Yes Tonye Pearson, MD  potassium chloride  (KLOR-CON 10) 10 MEQ tablet Take 2 tablets (20 mEq total) by mouth daily. Patient taking differently: Take 30 mEq by mouth daily.  11/18/14 See recent renal eval Yes Tonye Pearson, MD  rosuvastatin (CRESTOR) 20 MG tablet TAKE 1 TABLET BY MOUTH ONCE DIALY 12/08/14  Yes Tonye Pearson, MD  temazepam (RESTORIL) 30 MG capsule TAKE ONE CAPSULE BY MOUTH EVERY DAY AT BEDTIME AS NEEDED FOR SLEEP 03/17/14 Works well Yes Tonye Pearson, MD  traMADol-acetaminophen (ULTRACET) 37.5-325 MG per tablet TAKE 1 TABLET BY MOUTH EVERY 6 HOURS AS NEEDED 10/28/14 prn Yes Tonye Pearson, MD  zinc sulfate 220 MG capsule Take 220 mg by mouth daily.   Yes Historical Provider, MD   Social History   Social History  . Marital Status: Married    Spouse Name: N/A  . Number of Children: 3  . 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 nurse   Social History Main Topics  . Smoking status: Never Smoker   . Smokeless tobacco: Never Used  . Alcohol Use: No  . Drug Use: No  . Sexual Activity: Yes    Birth Control/ Protection: None     Review of Systems  Constitutional: Negative for chills.  HENT: Positive for congestion and sore throat.   Cardiovascular: Positive for leg swelling.  Musculoskeletal: Positive for myalgias, neck pain and neck stiffness.  Skin: Negative for color change and rash.  Neurological: Negative for weakness and numbness.  rest of 14pt ROS per form is negative  Objective:   Physical Exam  Constitutional: She is oriented to person, place, and time. She appears well-developed and well-nourished. No distress.  HENT:  Head: Normocephalic and atraumatic.  Right Ear: External ear normal.  Left Ear: External ear normal.  Nose: Nose normal.  Mouth/Throat: Oropharynx is clear and moist.  Eyes: Conjunctivae and EOM are normal. Pupils are equal, round, and reactive to light.  Neck: Normal range of motion. Neck supple. No thyromegaly present.    Cardiovascular: Normal rate, regular rhythm, normal heart sounds and intact distal pulses.   No murmur heard. Pulmonary/Chest: Effort normal and breath sounds normal. She has no wheezes.  Abdominal: Soft. Bowel sounds are normal. She exhibits no distension and no mass. There is no tenderness. There is no rebound.  Musculoskeletal: Normal range of motion. She exhibits no edema or tenderness.  Lymphadenopathy:    She has no cervical adenopathy.  Neurological: She is alert and oriented to person, place, and time. She has normal reflexes. No cranial nerve deficit.  Skin: Skin is warm and dry. No rash noted.  Psychiatric: She has a normal mood and affect. Her behavior is normal. Judgment and thought content normal.  Nursing note and vitals reviewed.    Filed Vitals:   02/17/15  1949  BP: 148/83  Pulse: 98  Temp: 98 F (36.7 C)  Resp: 16  Height: 5' (1.524 m)  Weight: 157 lb (71.215 kg)  SpO2: 98%   Results for orders placed or performed in visit on 02/17/15  POCT glycosylated hemoglobin (Hb A1C)  Result Value Ref Range   Hemoglobin A1C 5.9    Assessment & Plan:   1. Annual physical exam   2. Essential hypertension, benign   3. Hyperlipemia   4. BMI 31.0-31.9,adult   5. Gastroesophageal reflux disease without esophagitis   6. RAD (reactive airway disease), mild persistent, uncomplicated   7. Insomnia   8. Neck pain -chronic  9. Other seasonal allergic rhinitis   10. Hyperglycemia   11. Hypokalemia     Meds ordered this encounter  Medications  . fluticasone (FLONASE) 50 MCG/ACT nasal spray    Sig: Place 2 sprays into both nostrils daily.    Dispense:  16 g    Refill:  5  . famotidine (PEPCID) 40 MG tablet    Sig: Take 1 tablet (40 mg total) by mouth 2 (two) times daily.    Dispense:  180 tablet    Refill:  3  . temazepam (RESTORIL) 30 MG capsule    Sig: TAKE ONE CAPSULE BY MOUTH EVERY DAY AT BEDTIME AS NEEDED FOR SLEEP    Dispense:  90 capsule    Refill:  3  .  albuterol (PROVENTIL HFA) 108 (90 Base) MCG/ACT inhaler    Sig: INHALE 2 PUFFS INTO THE LUNGS EVERY 6 HOURS AS NEEDED FOR WHEEZING OR SHORTNESS OF BREATH    Dispense:  6.7 each    Refill:  5  . diazepam (VALIUM) 10 MG tablet    Sig: TAKE 1 TABLET EVERY 8 HOURS AS NEEDED FOR ANXIETY    Dispense:  270 tablet    Refill:  0  . diltiazem (CARDIZEM CD) 120 MG 24 hr capsule    Sig: Take 1 capsule (120 mg total) by mouth 2 (two) times daily.    Dispense:  180 capsule    Refill:  3  . Fluticasone-Salmeterol (ADVAIR DISKUS) 250-50 MCG/DOSE AEPB    Sig: INHALE 1 PUFF INTO THE LUNGS EVERY 12 (TWELVE) HOURS.    Dispense:  60 each    Refill:  5  . montelukast (SINGULAIR) 10 MG tablet    Sig: Take 1 tablet (10 mg total) by mouth at bedtime.    Dispense:  90 tablet    Refill:  3  . omeprazole (PRILOSEC) 40 MG capsule    Sig: Take 1 capsule (40 mg total) by mouth daily.    Dispense:  90 capsule    Refill:  3  . rosuvastatin (CRESTOR) 20 MG tablet    Sig: TAKE 1 TABLET BY MOUTH ONCE DIALY    Dispense:  90 tablet    Refill:  3  . zinc sulfate 220 MG capsule    Sig: Take 1 capsule (220 mg total) by mouth daily.    Dispense:  90 capsule    Refill:  3  . traMADol-acetaminophen (ULTRACET) 37.5-325 MG tablet    Sig: Take 1 tablet by mouth every 6 (six) hours as needed.    Dispense:  30 tablet    Refill:  2  . baclofen (LIORESAL) 20 MG tablet    Sig: Take 1 tablet (20 mg total) by mouth 2 (two) times daily. TAKE 1 TABLET BY MOUTH AT BEDTIME AS NEEDED    Dispense:  180 tablet  Refill:  3  . potassium chloride (KLOR-CON 10) 10 MEQ tablet    Sig: Take 3 tablets (30 mEq total) by mouth daily.    Dispense:  270 tablet    Refill:  3  . ibuprofen (ADVIL,MOTRIN) 800 MG tablet    Sig: Take 1 tablet (800 mg total) by mouth 3 (three) times daily.    Dispense:  270 tablet    Refill:  1   Recheck K monthly for 3 months  I have completed the patient encounter in its entirety as documented by the  scribe, with editing by me where necessary. Brevyn Ring P. Merla Richesoolittle, M.D.

## 2015-02-18 LAB — CBC WITH DIFFERENTIAL/PLATELET
Basophils Absolute: 0.1 10*3/uL (ref 0.0–0.1)
Basophils Relative: 1 % (ref 0–1)
Eosinophils Absolute: 0.1 10*3/uL (ref 0.0–0.7)
Eosinophils Relative: 1 % (ref 0–5)
HCT: 39.1 % (ref 36.0–46.0)
HEMOGLOBIN: 13.3 g/dL (ref 12.0–15.0)
Lymphocytes Relative: 29 % (ref 12–46)
Lymphs Abs: 2.3 10*3/uL (ref 0.7–4.0)
MCH: 27.3 pg (ref 26.0–34.0)
MCHC: 34 g/dL (ref 30.0–36.0)
MCV: 80.1 fL (ref 78.0–100.0)
MONO ABS: 0.7 10*3/uL (ref 0.1–1.0)
MPV: 9.9 fL (ref 8.6–12.4)
Monocytes Relative: 9 % (ref 3–12)
NEUTROS ABS: 4.7 10*3/uL (ref 1.7–7.7)
Neutrophils Relative %: 60 % (ref 43–77)
Platelets: 278 10*3/uL (ref 150–400)
RBC: 4.88 MIL/uL (ref 3.87–5.11)
RDW: 14.5 % (ref 11.5–15.5)
WBC: 7.8 10*3/uL (ref 4.0–10.5)

## 2015-02-18 LAB — LIPID PANEL
CHOLESTEROL: 167 mg/dL (ref 125–200)
HDL: 58 mg/dL (ref >=46)
LDL Cholesterol: 74 mg/dL (ref <130)
TRIGLYCERIDES: 177 mg/dL — AB (ref <150)
Total CHOL/HDL Ratio: 2.9 Ratio (ref <=5.0)
VLDL: 35 mg/dL — ABNORMAL HIGH (ref <30)

## 2015-03-13 ENCOUNTER — Other Ambulatory Visit: Payer: Self-pay | Admitting: Internal Medicine

## 2015-03-15 ENCOUNTER — Ambulatory Visit (INDEPENDENT_AMBULATORY_CARE_PROVIDER_SITE_OTHER): Payer: Federal, State, Local not specified - PPO | Admitting: Internal Medicine

## 2015-03-15 VITALS — BP 138/72 | HR 85 | Temp 97.6°F | Resp 16 | Ht 60.0 in | Wt 158.0 lb

## 2015-03-15 DIAGNOSIS — J0101 Acute recurrent maxillary sinusitis: Secondary | ICD-10-CM | POA: Diagnosis not present

## 2015-03-15 DIAGNOSIS — G8929 Other chronic pain: Secondary | ICD-10-CM

## 2015-03-15 DIAGNOSIS — M542 Cervicalgia: Secondary | ICD-10-CM

## 2015-03-15 MED ORDER — AMOXICILLIN-POT CLAVULANATE 875-125 MG PO TABS: 1.0000 | ORAL_TABLET | Freq: Two times a day (BID) | ORAL | Status: DC

## 2015-03-15 NOTE — Progress Notes (Signed)
Subjective:  This chart was scribed for Brooke Sia, MD by [facility], medical scribe at [facility].The patient was seen in exam room 11 and the patient's care was started at 8:32 AM.   Patient ID: Brooke Wilson, female    DOB: 1955-02-18, 60 y.o.   MRN: [id number] Chief Complaint  Patient presents with  . Sore Throat    x 1 week  . Nasal Congestion    x 1 week   HPI  HPI Comments: Brooke Wilson is a 60 y.o. female who presents to [facility] complaining of a sore throat, hoarseness and nasal congestion. Symptoms began one week ago. Sick contacts at work. She denies wheezing.  Past Medical History  Diagnosis Date  . Hypertension   . Hyperlipidemia   . History of measles, mumps, or rubella   . H/O varicella   . Allergy    Prior to Admission medications   Medication Sig Start Date End Date Taking? Authorizing Provider  albuterol (PROVENTIL HFA) 108 (90 Base) MCG/ACT inhaler INHALE 2 PUFFS INTO THE LUNGS EVERY 6 HOURS AS NEEDED FOR WHEEZING OR SHORTNESS OF BREATH 02/17/15  Yes Tonye Pearson, MD  aspirin 81 MG tablet Take 81 mg by mouth daily.   Yes Historical Provider, MD  baclofen (LIORESAL) 20 MG tablet Take 1 tablet (20 mg total) by mouth 2 (two) times daily. TAKE 1 TABLET BY MOUTH AT BEDTIME AS NEEDED 02/17/15  Yes Tonye Pearson, MD  diazepam (VALIUM) 10 MG tablet TAKE 1 TABLET EVERY 8 HOURS AS NEEDED FOR ANXIETY 02/17/15  Yes Tonye Pearson, MD  diltiazem (CARDIZEM CD) 120 MG 24 hr capsule Take 1 capsule (120 mg total) by mouth 2 (two) times daily. 02/17/15  Yes Tonye Pearson, MD  famotidine (PEPCID) 40 MG tablet Take 1 tablet (40 mg total) by mouth 2 (two) times daily. 02/17/15  Yes Tonye Pearson, MD  fluticasone (FLONASE) 50 MCG/ACT nasal spray Place 2 sprays into both nostrils daily. 02/17/15  Yes Tonye Pearson, MD  Fluticasone-Salmeterol (ADVAIR DISKUS) 250-50 MCG/DOSE AEPB INHALE 1 PUFF INTO THE LUNGS  EVERY 12 (TWELVE) HOURS. 02/17/15  Yes Tonye Pearson, MD  ibuprofen (ADVIL,MOTRIN) 800 MG tablet Take 1 tablet (800 mg total) by mouth 3 (three) times daily. 02/17/15  Yes Tonye Pearson, MD  latanoprost (XALATAN) 0.005 % ophthalmic solution PLACE 1 DROP INTO BOTH EYES 2 (TWO) TIMES DAILY. DISPENSE 90 DAY SUPPLY 12/31/13  Yes Tonye Pearson, MD  montelukast (SINGULAIR) 10 MG tablet Take 1 tablet (10 mg total) by mouth at bedtime. 02/17/15  Yes Tonye Pearson, MD  omeprazole (PRILOSEC) 40 MG capsule Take 1 capsule (40 mg total) by mouth daily. 02/17/15  Yes Tonye Pearson, MD  potassium chloride (KLOR-CON 10) 10 MEQ tablet Take 3 tablets (30 mEq total) by mouth daily. 02/17/15  Yes Tonye Pearson, MD  rosuvastatin (CRESTOR) 20 MG tablet TAKE 1 TABLET BY MOUTH ONCE DIALY 02/17/15  Yes Tonye Pearson, MD  temazepam (RESTORIL) 30 MG capsule TAKE ONE CAPSULE BY MOUTH EVERY DAY AT BEDTIME AS NEEDED FOR SLEEP 02/17/15  Yes Tonye Pearson, MD  traMADol-acetaminophen (ULTRACET) 37.5-325 MG tablet Take 1 tablet by mouth every 6 (six) hours as needed. 02/17/15  Yes Tonye Pearson, MD  zinc sulfate 220 MG capsule Take 1 capsule (220 mg total) by mouth daily. 02/17/15  Yes Tonye Pearson, MD   Allergies  Allergen  Reactions  . Ace Inhibitors Cough  . Flexeril [Cyclobenzaprine Hcl] Nausea Only  . Lisinopril Cough  . Naprosyn [Naproxen] Nausea And Vomiting    And high doses of NSAIDS  . Sulfa Antibiotics   . Bactrim Rash   Review of Systems  HENT: Positive for congestion, sneezing, sore throat and voice change.   Respiratory: Negative for wheezing.       Objective:  BP 138/72 mmHg  Pulse 85  Temp(Src) 97.6 F (36.4 C) (Oral)  Resp 16  Ht 5' (1.524 m)  Wt 158 lb (71.668 kg)  BMI 30.86 kg/m2  SpO2 96% Physical Exam  Constitutional: She is oriented to person, place, and time. She appears well-developed and well-nourished. No distress.  HENT:  Head: Normocephalic and  atraumatic.  TM opaque, nasals have purulent discharge. Throat is clear.   Eyes: Pupils are equal, round, and reactive to light.  Neck: Normal range of motion.  Cardiovascular: Normal rate and regular rhythm.   Pulmonary/Chest: Effort normal and breath sounds normal. No respiratory distress. She has no wheezes.  Musculoskeletal: Normal range of motion.  Neurological: She is alert and oriented to person, place, and time.  Skin: Skin is warm and dry.  Psychiatric: She has a normal mood and affect. Her behavior is normal.  Nursing note and vitals reviewed.     Assessment & Plan:  Recurrent maxillary sinusitis, unspecified chronicity  Chronic neck pain---fmla filled out  Meds ordered this encounter  Medications  . amoxicillin-clavulanate (AUGMENTIN) 875-125 MG tablet    Sig: Take 1 tablet by mouth 2 (two) times daily.    Dispense:  20 tablet    Refill:  0     By signing my name below, I, Brooke Wilson, attest that this documentation has been prepared under the direction and in the presence of Brooke Sia, MD.  Electronically Signed: [name], medical scribe. 03/15/2015, 8:40 AM.

## 2015-03-22 ENCOUNTER — Other Ambulatory Visit: Payer: Self-pay | Admitting: Internal Medicine

## 2015-04-10 ENCOUNTER — Other Ambulatory Visit: Payer: Self-pay | Admitting: Internal Medicine

## 2015-04-12 ENCOUNTER — Other Ambulatory Visit: Payer: Self-pay | Admitting: Internal Medicine

## 2015-04-20 ENCOUNTER — Other Ambulatory Visit: Payer: Self-pay | Admitting: Internal Medicine

## 2015-04-20 ENCOUNTER — Telehealth: Payer: Self-pay

## 2015-04-20 NOTE — Telephone Encounter (Signed)
The patient called to ask for a refill of latanoprost (XALATAN) 0.005 % ophthalmic solution.  She said the pharmacy sent a request, but I did not see a request in the system.  The last reorder that I see was in December 2016.  CB#: [phone]

## 2015-04-21 NOTE — Telephone Encounter (Signed)
Done already

## 2015-04-22 NOTE — Telephone Encounter (Signed)
Faxed tramadol. 

## 2015-05-27 ENCOUNTER — Other Ambulatory Visit: Payer: Self-pay | Admitting: Internal Medicine

## 2015-05-28 NOTE — Telephone Encounter (Signed)
Dr Merla Richesoolittle, do you want pt taking amlodipine? Was on her med list at 11/18/14 OV, but not at 02/17/15 OV and don't see any notes in between where you took pt off. Pharm reqs RF (you had last Rxd year RFs and pt should be out now).

## 2015-05-29 ENCOUNTER — Telehealth: Payer: Self-pay

## 2015-05-29 NOTE — Telephone Encounter (Signed)
THIS MESSAGE WAS FROM [facility] ON [address]: PATIENT IS REQUESTING A REFILL ON AMLODIPINE 10 MG FILLED LAST BY DR. Molly MaduroOBERT DOOLITTLE. BEST PHONE [phone] ([facility] [address]) MBC

## 2015-05-29 NOTE — Telephone Encounter (Signed)
Rx was sent  

## 2015-06-02 ENCOUNTER — Ambulatory Visit (INDEPENDENT_AMBULATORY_CARE_PROVIDER_SITE_OTHER): Payer: Federal, State, Local not specified - PPO | Admitting: Internal Medicine

## 2015-06-02 VITALS — BP 122/72 | HR 86 | Temp 98.2°F | Resp 18 | Ht 59.0 in | Wt 153.0 lb

## 2015-06-02 DIAGNOSIS — R05 Cough: Secondary | ICD-10-CM

## 2015-06-02 DIAGNOSIS — E876 Hypokalemia: Secondary | ICD-10-CM

## 2015-06-02 DIAGNOSIS — R059 Cough, unspecified: Secondary | ICD-10-CM

## 2015-06-02 DIAGNOSIS — R509 Fever, unspecified: Secondary | ICD-10-CM

## 2015-06-02 LAB — POCT CBC
GRANULOCYTE PERCENT: 63.5 % (ref 37–80)
HCT, POC: 39.4 % (ref 37.7–47.9)
HEMOGLOBIN: 13.9 g/dL (ref 12.2–16.2)
Lymph, poc: 1.9 (ref 0.6–3.4)
MCH: 27.3 pg (ref 27–31.2)
MCHC: 35.3 g/dL (ref 31.8–35.4)
MCV: 77.3 fL — AB (ref 80–97)
MID (CBC): 0.6 (ref 0–0.9)
MPV: 6.6 fL (ref 0–99.8)
POC Granulocyte: 4.4 (ref 2–6.9)
POC LYMPH PERCENT: 27.8 %L (ref 10–50)
POC MID %: 8.7 % (ref 0–12)
Platelet Count, POC: 238 10*3/uL (ref 142–424)
RBC: 5.09 M/uL (ref 4.04–5.48)
RDW, POC: 14.7 %
WBC: 6.9 10*3/uL (ref 4.6–10.2)

## 2015-06-02 LAB — COMPREHENSIVE METABOLIC PANEL
ALT: 12 U/L (ref 6–29)
AST: 15 U/L (ref 10–35)
Albumin: 4.5 g/dL (ref 3.6–5.1)
Alkaline Phosphatase: 141 U/L — ABNORMAL HIGH (ref 33–130)
BUN: 27 mg/dL — AB (ref 7–25)
CALCIUM: 9.2 mg/dL (ref 8.6–10.4)
CHLORIDE: 107 mmol/L (ref 98–110)
CO2: 21 mmol/L (ref 20–31)
Creat: 0.63 mg/dL (ref 0.50–1.05)
Glucose, Bld: 103 mg/dL — ABNORMAL HIGH (ref 65–99)
POTASSIUM: 4.4 mmol/L (ref 3.5–5.3)
Sodium: 140 mmol/L (ref 135–146)
Total Bilirubin: 0.3 mg/dL (ref 0.2–1.2)
Total Protein: 7.2 g/dL (ref 6.1–8.1)

## 2015-06-02 LAB — POCT INFLUENZA A/B
INFLUENZA B, POC: NEGATIVE
Influenza A, POC: NEGATIVE

## 2015-06-02 MED ORDER — PREDNISONE 20 MG PO TABS: ORAL_TABLET | ORAL | Status: DC

## 2015-06-02 NOTE — Progress Notes (Deleted)
    MRN: [id number] DOB: Jul 22, 1955  Subjective:   Brooke Wilson is a 60 y.o. female presenting for chief complaint of Cough; Generalized Body Aches; Laryngitis; URI; and Nasal Congestion  Reports ***  Aeisha has a current medication list which includes the following prescription(s): albuterol, aspirin, baclofen, diazepam, diltiazem, famotidine, fluticasone, fluticasone-salmeterol, ibuprofen, latanoprost, montelukast, omeprazole, potassium chloride, rosuvastatin, temazepam, tramadol-acetaminophen, and zinc sulfate. Also is allergic to ace inhibitors; flexeril; lisinopril; naprosyn; sulfa antibiotics; and bactrim.  Brooke Wilson  has a past medical history of Hypertension; Hyperlipidemia; History of measles, mumps, or rubella; H/O varicella; and Allergy. Also  has past surgical history that includes Vaginal hysterectomy and Tubal ligation.  Objective:   Vitals: BP 122/72 mmHg  Pulse 86  Temp(Src) 98.2 F (36.8 C) (Oral)  Resp 18  Ht 4\' 11"  (1.499 m)  Wt 153 lb (69.4 kg)  BMI 30.89 kg/m2  SpO2 97%  Physical Exam  No results found for this or any previous visit (from the past 24 hour(s)).  Assessment and Plan :     Wallis BambergMario Velvie Thomaston, PA-C [facility] [phone] 06/02/2015 8:44 AM

## 2015-06-02 NOTE — Progress Notes (Signed)
Subjective:  By signing my name below, I, [name], attest that this documentation has been prepared under the direction and in the presence of Ellamae Siaobert Tejas Seawood, MD.  Electronically Signed: Andrew Auaven Small, ED Scribe. 06/02/2015. 8:56 AM.   Patient ID: Brooke Wilson, female    DOB: 02-20-1955, 60 y.o.   MRN: [id number]  HPI Chief Complaint  Patient presents with  . Cough  . Generalized Body Aches  . Laryngitis  . URI  . Nasal Congestion    HPI Comments: Brooke Breamvangeline Routon is a 60 y.o. female who presents to the [facility] complaining of cough and body aches.. Pt states symptoms started 3 days ago with body aches and feeling weak initially thinking symptoms were due to allergies. She reports symptoms progressed with cough, sore throat, sneezing, nasal congestion, and fever of 100-101. She has been taking tylenol with minimal relief.   Pt would also like a refill of adderall.   Patient Active Problem List   Diagnosis Date Noted  . Mid back pain 02/17/2012  . Neck pain 02/17/2012  . RAD (reactive airway disease) 02/17/2012  . AR (allergic rhinitis) 02/17/2012  . Dental disease 02/17/2012  . GERD (gastroesophageal reflux disease) 05/17/2011  . Anxiety state 05/17/2011  . Insomnia 05/17/2011  . Essential hypertension, benign 03/23/2011  . Cough 03/23/2011  . Hyperlipemia 03/23/2011  . BMI 31.0-31.9,adult 03/23/2011   Past Medical History  Diagnosis Date  . Hypertension   . Hyperlipidemia   . History of measles, mumps, or rubella   . H/O varicella   . Allergy    Past Surgical History  Procedure Laterality Date  . Vaginal hysterectomy    . Tubal ligation     Allergies  Allergen Reactions  . Ace Inhibitors Cough  . Flexeril [Cyclobenzaprine Hcl] Nausea Only  . Lisinopril Cough  . Naprosyn [Naproxen] Nausea And Vomiting    And high doses of NSAIDS  . Sulfa Antibiotics   . Bactrim Rash   Prior to Admission medications   Medication Sig Start  Date End Date Taking? Authorizing Provider  albuterol (PROVENTIL HFA) 108 (90 Base) MCG/ACT inhaler INHALE 2 PUFFS INTO THE LUNGS EVERY 6 HOURS AS NEEDED FOR WHEEZING OR SHORTNESS OF BREATH 02/17/15  Yes Tonye Pearsonobert P Sarvesh Meddaugh, MD  aspirin 81 MG tablet Take 81 mg by mouth daily.   Yes Historical Provider, MD  baclofen (LIORESAL) 20 MG tablet Take 1 tablet (20 mg total) by mouth 2 (two) times daily. TAKE 1 TABLET BY MOUTH AT BEDTIME AS NEEDED 02/17/15  Yes Tonye Pearsonobert P Arnell Mausolf, MD  diazepam (VALIUM) 10 MG tablet TAKE 1 TABLET EVERY 8 HOURS AS NEEDED FOR ANXIETY 02/17/15  Yes Tonye Pearsonobert P Lethia Donlon, MD  diltiazem (CARDIZEM CD) 120 MG 24 hr capsule Take 1 capsule (120 mg total) by mouth 2 (two) times daily. 02/17/15  Yes Tonye Pearsonobert P Lota Leamer, MD  famotidine (PEPCID) 40 MG tablet Take 1 tablet (40 mg total) by mouth 2 (two) times daily. 02/17/15  Yes Tonye Pearsonobert P Arilla Hice, MD  fluticasone (FLONASE) 50 MCG/ACT nasal spray Place 2 sprays into both nostrils daily. 02/17/15  Yes Tonye Pearsonobert P Rogene Meth, MD  Fluticasone-Salmeterol (ADVAIR DISKUS) 250-50 MCG/DOSE AEPB INHALE 1 PUFF INTO THE LUNGS EVERY 12 (TWELVE) HOURS. 02/17/15  Yes Tonye Pearsonobert P Jodell Weitman, MD  ibuprofen (ADVIL,MOTRIN) 800 MG tablet Take 1 tablet (800 mg total) by mouth 3 (three) times daily. 02/17/15  Yes Tonye Pearsonobert P Anya Murphey, MD  latanoprost (XALATAN) 0.005 % ophthalmic solution PLACE 1 DROP INTO BOTH EYES  TWICE A DAY 04/20/15  Yes Tonye Pearson, MD  montelukast (SINGULAIR) 10 MG tablet Take 1 tablet (10 mg total) by mouth at bedtime. 02/17/15  Yes Tonye Pearson, MD  omeprazole (PRILOSEC) 40 MG capsule Take 1 capsule (40 mg total) by mouth daily. 02/17/15  Yes Tonye Pearson, MD  potassium chloride (KLOR-CON 10) 10 MEQ tablet Take 3 tablets (30 mEq total) by mouth daily. 02/17/15  Yes Tonye Pearson, MD  rosuvastatin (CRESTOR) 20 MG tablet TAKE 1 TABLET BY MOUTH ONCE DIALY 02/17/15  Yes Tonye Pearson, MD  temazepam (RESTORIL) 30 MG capsule TAKE ONE CAPSULE BY MOUTH  EVERY DAY AT BEDTIME AS NEEDED FOR SLEEP 02/17/15  Yes Tonye Pearson, MD  traMADol-acetaminophen (ULTRACET) 37.5-325 MG tablet TAKE 1 TABLET BY MOUTH EVERY 6 HOURS AS NEEDED 04/20/15  Yes Tonye Pearson, MD  zinc sulfate 220 MG capsule Take 1 capsule (220 mg total) by mouth daily. 02/17/15  Yes Tonye Pearson, MD   Social History   Social History  . Marital Status: Married    Spouse Name: N/A  . Number of Children: N/A  . Years of Education: N/A   Occupational History  . Not on file.   Social History Main Topics  . Smoking status: Never Smoker   . Smokeless tobacco: Never Used  . Alcohol Use: No  . Drug Use: No  . Sexual Activity: Yes    Birth Control/ Protection: None   Other Topics Concern  . Not on file   Social History Narrative   Review of Systems  Constitutional: Positive for fever, chills and fatigue.  HENT: Positive for congestion, sneezing and sore throat.   Respiratory: Positive for cough. Negative for shortness of breath and wheezing.   Musculoskeletal: Positive for myalgias.  Allergic/Immunologic: Positive for environmental allergies.    Objective:   Physical Exam  Constitutional: She is oriented to person, place, and time. She appears well-developed and well-nourished. No distress.  HENT:  Head: Normocephalic and atraumatic.  Nose: Rhinorrhea (clear) present.  Mouth/Throat: Oropharynx is clear and moist. No oropharyngeal exudate or posterior oropharyngeal erythema.  Eyes: EOM are normal. Right conjunctiva is injected. Left conjunctiva is injected.  Neck: Neck supple.  Cardiovascular: Normal rate, regular rhythm, normal heart sounds and intact distal pulses.   No murmur heard. Pulmonary/Chest: Effort normal and breath sounds normal. She has no rales.  Mild wheeze/cough with F Expir bilat  Musculoskeletal: Normal range of motion. She exhibits no edema.  Lymphadenopathy:    She has no cervical adenopathy.  Neurological: She is alert and oriented to  person, place, and time.  Skin: Skin is warm and dry.  Psychiatric: She has a normal mood and affect. Her behavior is normal.  Nursing note and vitals reviewed.  Filed Vitals:   06/02/15 0828  BP: 122/72  Pulse: 86  Temp: 98.2 F (36.8 C)  TempSrc: Oral  Resp: 18  Height:  (1.499 m)  Weight: 153 lb (69.4 kg)  SpO2: 97%    Results for orders placed or performed in visit on 06/02/15  POCT Influenza A/B  Result Value Ref Range   Influenza A, POC Negative Negative   Influenza B, POC Negative Negative  POCT CBC  Result Value Ref Range   WBC 6.9 4.6 - 10.2 K/uL   Lymph, poc 1.9 0.6 - 3.4   POC LYMPH PERCENT 27.8 10 - 50 %L   MID (cbc) 0.6 0 - 0.9   POC MID %  8.7 0 - 12 %M   POC Granulocyte 4.4 2 - 6.9   Granulocyte percent 63.5 37 - 80 %G   RBC 5.09 4.04 - 5.48 M/uL   Hemoglobin 13.9 12.2 - 16.2 g/dL   HCT, POC 16.1 09.6 - 47.9 %   MCV 77.3 (A) 80 - 97 fL   MCH, POC 27.3 27 - 31.2 pg   MCHC 35.3 31.8 - 35.4 g/dL   RDW, POC 04.5 %   Platelet Count, POC 238 142 - 424 K/uL   MPV 6.6 0 - 99.8 fL   Assessment & Plan:   1. Cough =RAD ? Viral infec vs Allergies  2. Fever, unspecified fever cause --resolved  3. Hypokalemia ---see hx//has nephrology f/u    Orders Placed This Encounter  Procedures  . Comprehensive metabolic panel  . POCT Influenza A/B  . POCT CBC    Meds ordered this encounter  Medications  . predniSONE (DELTASONE) 20 MG tablet    Sig: 3/3/2/2/1/1 single daily dose for 6 days    Dispense:  12 tablet    Refill:  0    I have completed the patient encounter in its entirety as documented by the scribe, with editing by me where necessary. Ovie Cornelio P. Merla Riches, M.D.

## 2015-06-02 NOTE — Patient Instructions (Signed)
     IF you received an x-ray today, you will receive an invoice from [facility]. Please contact [facility] at [phone] with questions or concerns regarding your invoice.   IF you received labwork today, you will receive an invoice from [facility]. Please contact [facility] at [phone] with questions or concerns regarding your invoice.   Our billing staff will not be able to assist you with questions regarding bills from these companies.  You will be contacted with the lab results as soon as they are available. The fastest way to get your results is to activate your My Chart account. Instructions are located on the last page of this paperwork. If you have not heard from us regarding the results in 2 weeks, please contact this office.      

## 2015-07-11 ENCOUNTER — Other Ambulatory Visit: Payer: Self-pay | Admitting: Internal Medicine

## 2015-09-09 ENCOUNTER — Other Ambulatory Visit: Payer: Self-pay

## 2015-09-09 DIAGNOSIS — G8929 Other chronic pain: Secondary | ICD-10-CM

## 2015-09-09 DIAGNOSIS — M542 Cervicalgia: Secondary | ICD-10-CM

## 2015-09-09 DIAGNOSIS — G4700 Insomnia, unspecified: Secondary | ICD-10-CM

## 2015-09-09 MED ORDER — BACLOFEN 20 MG PO TABS: 20.0000 mg | ORAL_TABLET | Freq: Two times a day (BID) | ORAL | 1 refills | Status: DC

## 2015-09-09 MED ORDER — DIAZEPAM 10 MG PO TABS: ORAL_TABLET | ORAL | 0 refills | Status: DC

## 2015-09-09 MED ORDER — TRAMADOL-ACETAMINOPHEN 37.5-325 MG PO TABS: 1.0000 | ORAL_TABLET | Freq: Four times a day (QID) | ORAL | 0 refills | Status: DC | PRN

## 2015-09-09 MED ORDER — TEMAZEPAM 30 MG PO CAPS: ORAL_CAPSULE | ORAL | 0 refills | Status: DC

## 2015-09-09 MED ORDER — IBUPROFEN 800 MG PO TABS: 800.0000 mg | ORAL_TABLET | Freq: Three times a day (TID) | ORAL | 1 refills | Status: DC

## 2015-09-09 NOTE — Telephone Encounter (Signed)
Meds ordered this encounter  Medications  . traMADol-acetaminophen (ULTRACET) 37.5-325 MG tablet    Sig: Take 1 tablet by mouth every 6 (six) hours as needed.    Dispense:  30 tablet    Refill:  0    Not to exceed 4 additional fills before 04/27/2015.  . temazepam (RESTORIL) 30 MG capsule    Sig: TAKE ONE CAPSULE BY MOUTH EVERY DAY AT BEDTIME AS NEEDED FOR SLEEP    Dispense:  30 capsule    Refill:  0  . diazepam (VALIUM) 10 MG tablet    Sig: TAKE 1 TABLET EVERY 8 HOURS AS NEEDED FOR ANXIETY    Dispense:  90 tablet    Refill:  0  . ibuprofen (ADVIL,MOTRIN) 800 MG tablet    Sig: Take 1 tablet (800 mg total) by mouth 3 (three) times daily.    Dispense:  270 tablet    Refill:  1  . baclofen (LIORESAL) 20 MG tablet    Sig: Take 1 tablet (20 mg total) by mouth 2 (two) times daily. TAKE 1 TABLET BY MOUTH AT BEDTIME AS NEEDED    Dispense:  180 tablet    Refill:  1    Please advise this patient that she needs to establish with a new PCP for additional fills of these medications. Please make sure that she understands out new same-day appointment system.

## 2015-09-09 NOTE — Telephone Encounter (Addendum)
Pharm faxed req for tramadol-acetaminopin, diazepam and temazepam. Pended for review. Brooke Wilson pt. Dr Merla Riches had put a year's RFs on his 02/17/15 Rx of temazepam, but RFs expire on controlled after 6 mos. I sent RFs of non-controlled meds req'd.

## 2015-09-09 NOTE — Telephone Encounter (Addendum)
Faxed controlled Rxs. I could not reach pt by phone and her Vm was full. I sent a Mychart message advising her of Chelle's message.

## 2015-10-30 ENCOUNTER — Other Ambulatory Visit: Payer: Self-pay | Admitting: Physician Assistant

## 2015-10-30 NOTE — Telephone Encounter (Signed)
Last RF 7/25 for 1 mos. Last OV w/ [facility] for neck pain 03/15/15.

## 2015-10-31 NOTE — Telephone Encounter (Signed)
RTC

## 2015-11-03 ENCOUNTER — Telehealth: Payer: Self-pay | Admitting: Family Medicine

## 2015-11-03 NOTE — Telephone Encounter (Signed)
Patient calling for a refill of 800 mg ibuprofen,40 mg Prilosec, 10 meq of K-Dur and 37.5-325 mg of tramadol I advised the pt that she may or may not be able for a refill because Mrs Doristine JohnsBarbra has emailed her that she need an ov for any more refills since Merla RichesDoolittle has retired I also told her our hours of appointment times

## 2015-11-04 MED ORDER — IBUPROFEN 800 MG PO TABS
800.0000 mg | ORAL_TABLET | Freq: Three times a day (TID) | ORAL | 0 refills | Status: DC
Start: 2015-11-04 — End: 2015-11-12

## 2015-11-04 MED ORDER — OMEPRAZOLE 40 MG PO CPDR: 40.0000 mg | DELAYED_RELEASE_CAPSULE | Freq: Every day | ORAL | 0 refills | Status: DC

## 2015-11-04 MED ORDER — POTASSIUM CHLORIDE ER 10 MEQ PO TBCR: 30.0000 meq | EXTENDED_RELEASE_TABLET | Freq: Every day | ORAL | 0 refills | Status: DC

## 2015-11-04 NOTE — Telephone Encounter (Signed)
VM has not been set up. I will send her a MyChart message since it is set up, and will send in 7 days of non-controlled meds to give her time to get in. She HAS to come in for more RFs. Tramadol was denied already. She will not be able to get any of that until she is seen.

## 2015-11-12 ENCOUNTER — Ambulatory Visit (INDEPENDENT_AMBULATORY_CARE_PROVIDER_SITE_OTHER): Payer: Federal, State, Local not specified - PPO | Admitting: Family Medicine

## 2015-11-12 VITALS — BP 156/88 | HR 73 | Temp 98.2°F | Resp 17 | Ht 59.0 in | Wt 159.0 lb

## 2015-11-12 DIAGNOSIS — K219 Gastro-esophageal reflux disease without esophagitis: Secondary | ICD-10-CM | POA: Diagnosis not present

## 2015-11-12 DIAGNOSIS — M542 Cervicalgia: Secondary | ICD-10-CM | POA: Diagnosis not present

## 2015-11-12 DIAGNOSIS — F99 Mental disorder, not otherwise specified: Secondary | ICD-10-CM

## 2015-11-12 DIAGNOSIS — I10 Essential (primary) hypertension: Secondary | ICD-10-CM | POA: Diagnosis not present

## 2015-11-12 DIAGNOSIS — M549 Dorsalgia, unspecified: Secondary | ICD-10-CM

## 2015-11-12 DIAGNOSIS — F5105 Insomnia due to other mental disorder: Secondary | ICD-10-CM | POA: Diagnosis not present

## 2015-11-12 DIAGNOSIS — J0101 Acute recurrent maxillary sinusitis: Secondary | ICD-10-CM | POA: Diagnosis not present

## 2015-11-12 DIAGNOSIS — K2100 Gastro-esophageal reflux disease with esophagitis, without bleeding: Secondary | ICD-10-CM

## 2015-11-12 DIAGNOSIS — J4530 Mild persistent asthma, uncomplicated: Secondary | ICD-10-CM | POA: Diagnosis not present

## 2015-11-12 DIAGNOSIS — J302 Other seasonal allergic rhinitis: Secondary | ICD-10-CM

## 2015-11-12 DIAGNOSIS — F411 Generalized anxiety disorder: Secondary | ICD-10-CM | POA: Diagnosis not present

## 2015-11-12 DIAGNOSIS — J0100 Acute maxillary sinusitis, unspecified: Secondary | ICD-10-CM

## 2015-11-12 DIAGNOSIS — E7800 Pure hypercholesterolemia, unspecified: Secondary | ICD-10-CM

## 2015-11-12 DIAGNOSIS — G8929 Other chronic pain: Secondary | ICD-10-CM

## 2015-11-12 HISTORY — DX: Acute maxillary sinusitis, unspecified: J01.00

## 2015-11-12 MED ORDER — ROSUVASTATIN CALCIUM 20 MG PO TABS: ORAL_TABLET | ORAL | 3 refills | Status: DC

## 2015-11-12 MED ORDER — PREDNISONE 20 MG PO TABS: ORAL_TABLET | ORAL | 0 refills | Status: DC

## 2015-11-12 MED ORDER — DIAZEPAM 10 MG PO TABS: ORAL_TABLET | ORAL | 5 refills | Status: DC

## 2015-11-12 MED ORDER — BACLOFEN 20 MG PO TABS: 20.0000 mg | ORAL_TABLET | Freq: Two times a day (BID) | ORAL | 1 refills | Status: DC

## 2015-11-12 MED ORDER — TEMAZEPAM 30 MG PO CAPS: ORAL_CAPSULE | ORAL | 0 refills | Status: DC

## 2015-11-12 MED ORDER — TRAMADOL-ACETAMINOPHEN 37.5-325 MG PO TABS: 1.0000 | ORAL_TABLET | Freq: Three times a day (TID) | ORAL | 5 refills | Status: DC | PRN

## 2015-11-12 MED ORDER — FAMOTIDINE 40 MG PO TABS: 40.0000 mg | ORAL_TABLET | Freq: Two times a day (BID) | ORAL | 3 refills | Status: DC

## 2015-11-12 MED ORDER — AMOXICILLIN-POT CLAVULANATE 875-125 MG PO TABS: 1.0000 | ORAL_TABLET | Freq: Two times a day (BID) | ORAL | 0 refills | Status: DC

## 2015-11-12 MED ORDER — IBUPROFEN 800 MG PO TABS: 800.0000 mg | ORAL_TABLET | Freq: Three times a day (TID) | ORAL | 1 refills | Status: DC

## 2015-11-12 MED ORDER — MONTELUKAST SODIUM 10 MG PO TABS: 10.0000 mg | ORAL_TABLET | Freq: Every day | ORAL | 3 refills | Status: DC

## 2015-11-12 MED ORDER — OMEPRAZOLE 40 MG PO CPDR: 40.0000 mg | DELAYED_RELEASE_CAPSULE | Freq: Every day | ORAL | 3 refills | Status: DC

## 2015-11-12 NOTE — Patient Instructions (Signed)
   IF you received an x-ray today, you will receive an invoice from [facility]. Please contact [facility] at [phone] with questions or concerns regarding your invoice.   IF you received labwork today, you will receive an invoice from [facility]. Please contact [facility] at [phone] with questions or concerns regarding your invoice.   Our billing staff will not be able to assist you with questions regarding bills from these companies.  You will be contacted with the lab results as soon as they are available. The fastest way to get your results is to activate your My Chart account. Instructions are located on the last page of this paperwork. If you have not heard from us regarding the results in 2 weeks, please contact this office.     Sinusitis, Adult Sinusitis is redness, soreness, and inflammation of the paranasal sinuses. Paranasal sinuses are air pockets within the bones of your face. They are located beneath your eyes, in the middle of your forehead, and above your eyes. In healthy paranasal sinuses, mucus is able to drain out, and air is able to circulate through them by way of your nose. However, when your paranasal sinuses are inflamed, mucus and air can become trapped. This can allow bacteria and other germs to grow and cause infection. Sinusitis can develop quickly and last only a short time (acute) or continue over a long period (chronic). Sinusitis that lasts for more than 12 weeks is considered chronic. CAUSES Causes of sinusitis include:  Allergies.  Structural abnormalities, such as displacement of the cartilage that separates your nostrils (deviated septum), which can decrease the air flow through your nose and sinuses and affect sinus drainage.  Functional abnormalities, such as when the small hairs (cilia) that line your sinuses and help remove mucus do not work properly or are not present. SIGNS AND SYMPTOMS Symptoms  of acute and chronic sinusitis are the same. The primary symptoms are pain and pressure around the affected sinuses. Other symptoms include:  Upper toothache.  Earache.  Headache.  Bad breath.  Decreased sense of smell and taste.  A cough, which worsens when you are lying flat.  Fatigue.  Fever.  Thick drainage from your nose, which often is green and may contain pus (purulent).  Swelling and warmth over the affected sinuses. DIAGNOSIS Your health care provider will perform a physical exam. During your exam, your health care provider may perform any of the following to help determine if you have acute sinusitis or chronic sinusitis:  Look in your nose for signs of abnormal growths in your nostrils (nasal polyps).  Tap over the affected sinus to check for signs of infection.  View the inside of your sinuses using an imaging device that has a light attached (endoscope). If your health care provider suspects that you have chronic sinusitis, one or more of the following tests may be recommended:  Allergy tests.  Nasal culture. A sample of mucus is taken from your nose, sent to a lab, and screened for bacteria.  Nasal cytology. A sample of mucus is taken from your nose and examined by your health care provider to determine if your sinusitis is related to an allergy. TREATMENT Most cases of acute sinusitis are related to a viral infection and will resolve on their own within 10 days. Sometimes, medicines are prescribed to help relieve symptoms of both acute and chronic sinusitis. These may include pain medicines, decongestants, nasal steroid sprays, or saline sprays. However, for sinusitis related   to a bacterial infection, your health care provider will prescribe antibiotic medicines. These are medicines that will help kill the bacteria causing the infection. Rarely, sinusitis is caused by a fungal infection. In these cases, your health care provider will prescribe antifungal  medicine. For some cases of chronic sinusitis, surgery is needed. Generally, these are cases in which sinusitis recurs more than 3 times per year, despite other treatments. HOME CARE INSTRUCTIONS  Drink plenty of water. Water helps thin the mucus so your sinuses can drain more easily.  Use a humidifier.  Inhale steam 3-4 times a day (for example, sit in the bathroom with the shower running).  Apply a warm, moist washcloth to your face 3-4 times a day, or as directed by your health care provider.  Use saline nasal sprays to help moisten and clean your sinuses.  Take medicines only as directed by your health care provider.  If you were prescribed either an antibiotic or antifungal medicine, finish it all even if you start to feel better. SEEK IMMEDIATE MEDICAL CARE IF:  You have increasing pain or severe headaches.  You have nausea, vomiting, or drowsiness.  You have swelling around your face.  You have vision problems.  You have a stiff neck.  You have difficulty breathing.   This information is not intended to replace advice given to you by your health care provider. Make sure you discuss any questions you have with your health care provider.   Document Released: 02/01/2005 Document Revised: 02/22/2014 Document Reviewed: 02/16/2011 Elsevier Interactive Patient Education 2016 [employer]  

## 2015-11-12 NOTE — Progress Notes (Signed)
By signing my name below, I, Mesha Guinyard, attest that this documentation has been prepared under the direction and in the presence of Nilda SimmerKristi Tylesha Gibeault, MD.  Electronically Signed: Arvilla MarketMesha Guinyard, Medical Scribe. 11/12/2015. 5:39 PM.  Subjective:    Patient ID: Brooke BreamEvangeline Hanna, female    DOB: 08/30/1955, 60 y.o.   MRN: [id number]  11/12/2015  Allergies (Nasal congestion, cough at night, HA and sorethroat.) and Medication Refill (See sheet)   HPI  HPI Comments: Brooke Wilson is a 60 y.o. female who presents to the [facility] complaining of allergies onset 3 weeks. Pt reports nasal congestion, cough with green sputum especially at night, HA, sore throat, watery eyes, and sinus pressure. Pt has tried motrin, ASA, flonase, singular, and albuterol without relief to her symptoms. Pt mentions she doesn't like taking eye drops. Pt works as a Engineer, civil (consulting)nurse and was stressed from the state in the hospital for 3 days and suspects this increased her chances of getting sick. Pt denies fever, and ear pain.  HTN: Pt checks her bp at night and he numbers have been fine. Pt was moved to HCTZ, and was put on Losartan but discontinued due to coughing. Once she was taken off, her coughing stopped.  Palpitations: Pt reports palpitations. Pt had a EKG and she had nl results. Pt had a 24 hours heart  monitor and she had nl results.  Neck Pain: Pt takes baclofen, and valium for muscle aches in her neck; TID for bad days, and BID for good days. Pt had scans on her neck and was told she has RA in her c-spine.  Medications: Pt states her potassium is uncontrolled. Pt takes amlodipine 5 mg,  hydralazine TID, omeprazole, pepcid, crestor, motrin for myalgias. Pt takes restoril QHS for sleep. Pt takes tramadol TID and ASA BID to prevent aneurysm. Pt has a extensive FHx of aneurysm. Pt doesn't smoke or drink alcohol. Pt goes to [employer] every now and then.   [place] controlled substance registry:     09/09/15 Tramadol #30 Chelle Jeffery 09/09/15 Temazepam #30 Chelle Jeffery 09/09/15 Diazepam 10mg  #90 Chelle Jeffery 06/15/15 Tramadol #30 [place]   Review of Systems  Constitutional: Negative for chills, diaphoresis, fatigue and fever.  HENT: Positive for congestion, sinus pressure and sore throat. Negative for ear pain.   Eyes: Positive for discharge. Negative for visual disturbance.  Respiratory: Positive for cough. Negative for shortness of breath.   Cardiovascular: Positive for palpitations. Negative for chest pain and leg swelling.  Gastrointestinal: Negative for abdominal pain, constipation, diarrhea, nausea and vomiting.  Endocrine: Negative for cold intolerance, heat intolerance, polydipsia, polyphagia and polyuria.  Musculoskeletal: Positive for arthralgias, myalgias and neck pain.  Allergic/Immunologic: Positive for environmental allergies.  Neurological: Negative for dizziness, tremors, seizures, syncope, facial asymmetry, speech difficulty, weakness, light-headedness, numbness and headaches.    Past Medical History:  Diagnosis Date  . Allergy   . Asthma   . H/O varicella   . History of measles, mumps, or rubella   . Hyperlipidemia   . Hypertension    Past Surgical History:  Procedure Laterality Date  . TUBAL LIGATION    . VAGINAL HYSTERECTOMY     Allergies  Allergen Reactions  . Ace Inhibitors Cough  . Flexeril [Cyclobenzaprine Hcl] Nausea Only  . Lisinopril Cough  . Naprosyn [Naproxen] Nausea And Vomiting    And high doses of NSAIDS  . Sulfa Antibiotics   . Bactrim Rash    Social History   Social History  .  Marital status: Married    Spouse name: N/A  . Number of children: N/A  . Years of education: N/A   Occupational History  . Not on file.   Social History Main Topics  . Smoking status: Never Smoker  . Smokeless tobacco: Never Used  . Alcohol use No  . Drug use: No  . Sexual activity: Yes    Birth control/ protection: None   Other Topics  Concern  . Not on file   Social History Narrative   Marital status:  Married x 37 years      Children: 3 daughters      Lives: with husband      Employment:  Charity fundraiser at [employer]      Tobacco: none      Alcohol: none     Exercise: walking at work; [employer].      Family History  Problem Relation Age of Onset  . Cancer Maternal Grandmother     ovarian       Objective:    BP (!) 156/88 (BP Location: Right Arm, Patient Position: Sitting, Cuff Size: Normal)   Pulse 73   Temp 98.2 F (36.8 C) (Oral)   Resp 17   Ht 4\' 11"  (1.499 m)   Wt 159 lb (72.1 kg)   SpO2 96%   BMI 32.11 kg/m  Physical Exam  Constitutional: She is oriented to person, place, and time. She appears well-developed and well-nourished. No distress.  HENT:  Head: Normocephalic and atraumatic.  Right Ear: External ear normal.  Left Ear: External ear normal.  Nose: Right sinus exhibits maxillary sinus tenderness. Left sinus exhibits maxillary sinus tenderness.  Mouth/Throat: Oropharyngeal exudate present.  Eyes: Conjunctivae and EOM are normal. Pupils are equal, round, and reactive to light.  Neck: Normal range of motion. Neck supple. Carotid bruit is not present. No thyromegaly present.  Cardiovascular: Normal rate, regular rhythm, normal heart sounds and intact distal pulses.  Exam reveals no gallop and no friction rub.   No murmur heard. Pulmonary/Chest: Effort normal and breath sounds normal. No respiratory distress. She has no wheezes. She has no rales.  Abdominal: Soft. Bowel sounds are normal. She exhibits no distension and no mass. There is no tenderness. There is no rebound and no guarding.  Musculoskeletal:       Cervical back: She exhibits decreased range of motion, pain and spasm. She exhibits no tenderness, no bony tenderness and normal pulse.  Lymphadenopathy:    She has no cervical adenopathy.  Neurological: She is alert and oriented to person, place, and time. No cranial nerve deficit.    Skin: Skin is warm and dry. No rash noted. She is not diaphoretic. No erythema. No pallor.  Psychiatric: She has a normal mood and affect. Her behavior is normal.  Nursing note and vitals reviewed.    Assessment & Plan:   1. Other seasonal allergic rhinitis   2. RAD (reactive airway disease), mild persistent, uncomplicated   3. Essential hypertension, benign   4. Gastroesophageal reflux disease without esophagitis   5. Hyperlipemia   6. Neck pain   7. Mid back pain   8. Insomnia   9. Anxiety state   10. Acute recurrent maxillary sinusitis   11. Gastroesophageal reflux disease with esophagitis   12. Chronic neck pain     -uncontrolled allergic rhinitis caused acute maxillary sinusitis; rx for Prednisone, Augmentin provided. Refill of Singulair.  -refill of hydralazine, Diltiazem, Crestor provided. -refill of Ultracet, Ibuprofen, Valium, Baclofen provided. -refilled  Restoril for one month; discussed the need to wean one benzo; I will not prescribe two benzos for pt.    No orders of the defined types were placed in this encounter.  Meds ordered this encounter  Medications  . hydrALAZINE (APRESOLINE) 25 MG tablet    Sig: TAKE 0.5 TABLETS (12.5 MG TOTAL) BY MOUTH THREE (3) TIMES A DAY.    Refill:  6  . diltiazem (CARDIZEM CD) 240 MG 24 hr capsule    Sig: Take 240 mg by mouth.  Marland Kitchen amoxicillin-clavulanate (AUGMENTIN) 875-125 MG tablet    Sig: Take 1 tablet by mouth 2 (two) times daily.    Dispense:  20 tablet    Refill:  0  . predniSONE (DELTASONE) 20 MG tablet    Sig: Take 3 PO QAM x 1 day, 2 PO QAM x 5 days, 1 PO QAM x 5 days    Dispense:  18 tablet    Refill:  0  . traMADol-acetaminophen (ULTRACET) 37.5-325 MG tablet    Sig: Take 1 tablet by mouth every 8 (eight) hours as needed.    Dispense:  90 tablet    Refill:  5    Not to exceed 4 additional fills before 04/27/2015.  . rosuvastatin (CRESTOR) 20 MG tablet    Sig: TAKE 1 TABLET BY MOUTH ONCE DIALY    Dispense:  90  tablet    Refill:  3  . montelukast (SINGULAIR) 10 MG tablet    Sig: Take 1 tablet (10 mg total) by mouth at bedtime.    Dispense:  90 tablet    Refill:  3  . ibuprofen (ADVIL,MOTRIN) 800 MG tablet    Sig: Take 1 tablet (800 mg total) by mouth 3 (three) times daily.    Dispense:  60 tablet    Refill:  1    NO MORE REFILLS WITHOUT OFFICE VISIT - 2ND NOTICE  . famotidine (PEPCID) 40 MG tablet    Sig: Take 1 tablet (40 mg total) by mouth 2 (two) times daily.    Dispense:  180 tablet    Refill:  3  . omeprazole (PRILOSEC) 40 MG capsule    Sig: Take 1 capsule (40 mg total) by mouth daily.    Dispense:  90 capsule    Refill:  3  . baclofen (LIORESAL) 20 MG tablet    Sig: Take 1 tablet (20 mg total) by mouth 2 (two) times daily.    Dispense:  180 tablet    Refill:  1  . diazepam (VALIUM) 10 MG tablet    Sig: TAKE 1 TABLET EVERY 8 HOURS AS NEEDED FOR ANXIETY    Dispense:  90 tablet    Refill:  5  . temazepam (RESTORIL) 30 MG capsule    Sig: TAKE ONE CAPSULE BY MOUTH EVERY DAY AT BEDTIME AS NEEDED FOR SLEEP    Dispense:  30 capsule    Refill:  0    Return in about 4 months (around 03/13/2016) for complete physical examiniation.  I personally performed the services described in this documentation, which was scribed in my presence. The recorded information has been reviewed and considered.  Tawnia Schirm Paulita Fujita, M.D. [facility] [address] [place], [place]  [address] [phone] phone [phone] fax

## 2015-11-25 ENCOUNTER — Other Ambulatory Visit: Payer: Self-pay

## 2015-11-25 MED ORDER — FLUTICASONE PROPIONATE 50 MCG/ACT NA SUSP: NASAL | 1 refills | Status: DC

## 2016-04-15 ENCOUNTER — Ambulatory Visit (INDEPENDENT_AMBULATORY_CARE_PROVIDER_SITE_OTHER): Payer: Federal, State, Local not specified - PPO | Admitting: Family Medicine

## 2016-04-15 VITALS — BP 150/100 | HR 80 | Temp 98.8°F | Resp 16 | Ht 59.0 in | Wt 157.8 lb

## 2016-04-15 DIAGNOSIS — R300 Dysuria: Secondary | ICD-10-CM | POA: Diagnosis not present

## 2016-04-15 LAB — POCT URINALYSIS DIP (MANUAL ENTRY)
BILIRUBIN UA: NEGATIVE
Glucose, UA: NEGATIVE
Ketones, POC UA: NEGATIVE
NITRITE UA: NEGATIVE
Protein Ur, POC: NEGATIVE
Spec Grav, UA: 1.015
Urobilinogen, UA: 0.2
pH, UA: 5.5

## 2016-04-15 LAB — POC MICROSCOPIC URINALYSIS (UMFC): Mucus: ABSENT

## 2016-04-15 MED ORDER — NITROFURANTOIN MONOHYD MACRO 100 MG PO CAPS: 100.0000 mg | ORAL_CAPSULE | Freq: Two times a day (BID) | ORAL | 0 refills | Status: DC

## 2016-04-15 NOTE — Progress Notes (Signed)
SUBJECTIVE: Brooke Wilson is a 61 y.o. female who complains of urinary frequency, urgency and dysuria 3 days, without flank pain, fever, chills, or abnormal vaginal discharge or bleeding. She has taken medication for pyridium without relief of discomfort. Prior hx of 1 UTI many years ago, although nothing recently.  Past Medical History:  Diagnosis Date  . Allergy   . Asthma   . H/O varicella   . History of measles, mumps, or rubella   . Hyperlipidemia   . Hypertension    ROS See HPI   OBJECTIVE:   Blood pressure (!) 142/80, pulse 80, temperature 98.8 F (37.1 C), temperature source Oral, resp. rate 16, height 4\' 11"  (1.499 m), weight 157 lb 12.8 oz (71.6 kg), SpO2 95 %.  Appears well, in no apparent distress.  Vital signs are normal with exception of elevated blood pressure. The abdomen is soft without tenderness, guarding, mass, rebound or organomegaly. No CVA tenderness or inguinal adenopathy noted. Urine dipstick shows positive for RBC's and Leukocytes.  Micro exam: WBC's per HPF,  RBC's per HPF and possible contaminated specimen.   ASSESSMENT:  Possible UTI uncomplicated without evidence of pyelonephritis. Pt presents reports symptoms consistent with UTI.  Due to age and increased risk of pyelonephritis, will treat empirically.  Urine culture pending  PLAN:  -Nitrofurantion 100 mg twice daily x 10 days - Push fluids, may use Pyridium OTC prn.  -Call or return to clinic prn if these symptoms worsen or fail to improve as anticipated.  Godfrey PickKimberly S. Tiburcio PeaHarris, MSN, FNP-C [facility] [phone]

## 2016-04-15 NOTE — Patient Instructions (Addendum)
Take nitrofurantoin 100 mg twice daily x 10 days.  Return in two weeks for urine and blood pressure recheck only.  If blood pressure is consistently greater than 140/90, please return for follow-up sooner.   IF you received an x-ray today, you will receive an invoice from [facility]. Please contact [facility] at [phone] with questions or concerns regarding your invoice.   IF you received labwork today, you will receive an invoice from [facility]. Please contact [facility] at [phone] with questions or concerns regarding your invoice.   Our billing staff will not be able to assist you with questions regarding bills from these companies.  You will be contacted with the lab results as soon as they are available. The fastest way to get your results is to activate your My Chart account. Instructions are located on the last page of this paperwork. If you have not heard from us regarding the results in 2 weeks, please contact this office.      Urinary Tract Infection, Adult A urinary tract infection (UTI) is an infection of any part of the urinary tract. The urinary tract includes the:  Kidneys.  Ureters.  Bladder.  Urethra. These organs make, store, and get rid of pee (urine) in the body. Follow these instructions at home:  Take over-the-counter and prescription medicines only as told by your doctor.  If you were prescribed an antibiotic medicine, take it as told by your doctor. Do not stop taking the antibiotic even if you start to feel better.  Avoid the following drinks:  Alcohol.  Caffeine.  Tea.  Carbonated drinks.  Drink enough fluid to keep your pee clear or pale yellow.  Keep all follow-up visits as told by your doctor. This is important.  Make sure to:  Empty your bladder often and completely. Do not to hold pee for long periods of time.  Empty your bladder before and after sex.  Wipe from front to back after a bowel movement if you  are female. Use each tissue one time when you wipe. Contact a doctor if:  You have back pain.  You have a fever.  You feel sick to your stomach (nauseous).  You throw up (vomit).  Your symptoms do not get better after 3 days.  Your symptoms go away and then come back. Get help right away if:  You have very bad back pain.  You have very bad lower belly (abdominal) pain.  You are throwing up and cannot keep down any medicines or water. This information is not intended to replace advice given to you by your health care provider. Make sure you discuss any questions you have with your health care provider. Document Released: 07/21/2007 Document Revised: 07/10/2015 Document Reviewed: 12/23/2014 Elsevier Interactive Patient Education  2017 [employer]

## 2016-04-17 ENCOUNTER — Encounter: Payer: Self-pay | Admitting: Family Medicine

## 2016-04-17 DIAGNOSIS — Z1231 Encounter for screening mammogram for malignant neoplasm of breast: Secondary | ICD-10-CM | POA: Diagnosis not present

## 2016-04-17 LAB — URINE CULTURE: ORGANISM ID, BACTERIA: NO GROWTH

## 2016-04-18 ENCOUNTER — Encounter: Payer: Self-pay | Admitting: Family Medicine

## 2016-05-06 ENCOUNTER — Other Ambulatory Visit: Payer: Self-pay | Admitting: Family Medicine

## 2016-05-06 NOTE — Telephone Encounter (Signed)
Patient last seen for this in 05/2015. Last cr was drawn then and was within normal limits. Patient needs an OV for a recheck to continue receiving ibuprofen.

## 2016-05-25 ENCOUNTER — Other Ambulatory Visit: Payer: Self-pay | Admitting: Urgent Care

## 2016-05-25 ENCOUNTER — Other Ambulatory Visit: Payer: Self-pay | Admitting: Family Medicine

## 2016-05-29 ENCOUNTER — Other Ambulatory Visit: Payer: Self-pay | Admitting: *Deleted

## 2016-05-29 DIAGNOSIS — J301 Allergic rhinitis due to pollen: Secondary | ICD-10-CM

## 2016-05-29 MED ORDER — FLUTICASONE PROPIONATE 50 MCG/ACT NA SUSP: NASAL | 0 refills | Status: DC

## 2016-05-30 ENCOUNTER — Other Ambulatory Visit: Payer: Self-pay | Admitting: Urgent Care

## 2016-05-31 MED ORDER — IBUPROFEN 800 MG PO TABS: 800.0000 mg | ORAL_TABLET | Freq: Three times a day (TID) | ORAL | 0 refills | Status: DC

## 2016-05-31 NOTE — Telephone Encounter (Signed)
Call --- I approved refills for Flonase and Ibuprofen for patient.  I did not approve refills for Valium and Ulracet as patient is overdue for six month follow-up; please schedule OV with me this month.

## 2016-06-18 ENCOUNTER — Other Ambulatory Visit: Payer: Self-pay | Admitting: Family Medicine

## 2016-06-19 NOTE — Telephone Encounter (Signed)
05/31/16 last refill 10/2015 last ov

## 2016-06-28 ENCOUNTER — Telehealth: Payer: Self-pay | Admitting: Family Medicine

## 2016-06-28 DIAGNOSIS — Z0000 Encounter for general adult medical examination without abnormal findings: Secondary | ICD-10-CM

## 2016-06-28 DIAGNOSIS — E7800 Pure hypercholesterolemia, unspecified: Secondary | ICD-10-CM

## 2016-06-28 DIAGNOSIS — Z113 Encounter for screening for infections with a predominantly sexual mode of transmission: Secondary | ICD-10-CM

## 2016-06-28 DIAGNOSIS — Z136 Encounter for screening for cardiovascular disorders: Secondary | ICD-10-CM

## 2016-06-28 DIAGNOSIS — I10 Essential (primary) hypertension: Secondary | ICD-10-CM

## 2016-06-28 DIAGNOSIS — Z1383 Encounter for screening for respiratory disorder NEC: Secondary | ICD-10-CM

## 2016-06-28 DIAGNOSIS — R7303 Prediabetes: Secondary | ICD-10-CM

## 2016-06-28 DIAGNOSIS — Z1329 Encounter for screening for other suspected endocrine disorder: Secondary | ICD-10-CM

## 2016-06-28 DIAGNOSIS — Z1389 Encounter for screening for other disorder: Secondary | ICD-10-CM

## 2016-06-28 NOTE — Telephone Encounter (Signed)
Future lab orders entered so pt may stop by for FASTING labs at any time.  Will defer refill of controlled meds to PCP. (Dr. Katrinka BlazingSmith - let me know whether you are ok with me refilling meds at status quo at time of OV for several mos or whether you would prefer that pt sched a f/u OV with you on your next available Saturday for additional refills.)

## 2016-06-28 NOTE — Telephone Encounter (Signed)
PATIENT COULD ONLY COME IN ON Saturday (07/03/16) TO HAVE HER COMPLETE PHYSICAL DUE TO HER WORK SCHEDULE. DR. Katrinka BlazingSMITH IS HER PCP BUT SHE WILL NOT BE HERE. I HAVE SCHEDULED HER PHYSICAL WITH DR. Carley HammedEVA SHAW. IN THE MEANTIME, SHE WOULD LIKE DR. Katrinka BlazingSMITH TO REFILL HER TRAMADOL-ACETAMINOPHEN 37.5-325 MG AND DIAZEPAM 10 MG BEFORE SHE RUNS OUT. SHE WOULD ALSO LIKE DR. Katrinka BlazingSMITH TO PUT IN A LAB ORDER SO SHE CAN HAVE HER BLOOD DRAWN BEFORE HER PHYSICAL SO THE RESULTS WILL BE BACK BEFORE Saturday. BEST PHONE [phone] (CELL) PHARMACY CHOICE IS [facility] [address]. MBC

## 2016-06-29 NOTE — Telephone Encounter (Signed)
Recommend following current controlled substance policy ---- Dr. Clelia CroftShaw can provide a one month supply of Ultracet and Valium if you are comfortable.  Must see me for further refills as PCP.

## 2016-07-01 DIAGNOSIS — E782 Mixed hyperlipidemia: Secondary | ICD-10-CM | POA: Diagnosis not present

## 2016-07-01 DIAGNOSIS — Z131 Encounter for screening for diabetes mellitus: Secondary | ICD-10-CM | POA: Diagnosis not present

## 2016-07-01 DIAGNOSIS — Z1389 Encounter for screening for other disorder: Secondary | ICD-10-CM | POA: Diagnosis not present

## 2016-07-01 DIAGNOSIS — Z114 Encounter for screening for human immunodeficiency virus [HIV]: Secondary | ICD-10-CM | POA: Diagnosis not present

## 2016-07-01 DIAGNOSIS — Z0000 Encounter for general adult medical examination without abnormal findings: Secondary | ICD-10-CM | POA: Diagnosis not present

## 2016-07-01 NOTE — Telephone Encounter (Signed)
Tried to call patient on 07/01/16 did not get an answer and there was no voicemail   Patient did call back wanting to cancel her 07/03/16 appt with Dr Clelia CroftShaw I explained I had just tried to call her to cancel it and that Dr Katrinka BlazingSmith would call in her RX to cover her until she could get in to see Dr Katrinka BlazingSmith, patient stated she could not come in on Dr Michaelle CopasSmith's next three days and would call back later to make an appointment.

## 2016-07-03 ENCOUNTER — Encounter: Payer: Federal, State, Local not specified - PPO | Admitting: Family Medicine

## 2016-07-03 NOTE — Telephone Encounter (Signed)
[place] Controlled Substance Registry: 07/02/16 Tramadol #120 by Dr. Denton MeekStanley Wilson 07/02/16 Tamazepam 30mg  #30 by Dr. Denton MeekStanley Wilson 07/02/16 Diazepam 10mg  #90 by Dr. Denton MeekStanley Wilson.   Thus, pt does not need refills until office visit with me.   Pt may have established with this provider?  No further action warranted at this time.

## 2016-07-15 DIAGNOSIS — I10 Essential (primary) hypertension: Secondary | ICD-10-CM | POA: Diagnosis not present

## 2016-07-15 DIAGNOSIS — E782 Mixed hyperlipidemia: Secondary | ICD-10-CM | POA: Diagnosis not present

## 2016-07-15 DIAGNOSIS — G4700 Insomnia, unspecified: Secondary | ICD-10-CM | POA: Diagnosis not present

## 2016-07-15 DIAGNOSIS — J3089 Other allergic rhinitis: Secondary | ICD-10-CM | POA: Diagnosis not present

## 2016-08-31 ENCOUNTER — Other Ambulatory Visit: Payer: Self-pay | Admitting: *Deleted

## 2016-08-31 DIAGNOSIS — I10 Essential (primary) hypertension: Secondary | ICD-10-CM

## 2016-08-31 MED ORDER — AMLODIPINE BESYLATE 10 MG PO TABS: 10.0000 mg | ORAL_TABLET | Freq: Every day | ORAL | 0 refills | Status: DC

## 2016-09-29 NOTE — Progress Notes (Deleted)
Cardiology Office Note:    Date:  09/29/2016   ID:  Brooke BreamEvangeline Wilson, DOB [birth year], MRN [id number]  PCP:  Ethelda ChickSmith, Kristi M, MD  Cardiologist:  Norman HerrlichBrian Munley, MD    Referring MD: Ethelda ChickSmith, Kristi M, MD    ASSESSMENT:    No diagnosis found. PLAN:    In order of problems listed above:  1. ***   Next appointment: ***   Medication Adjustments/Labs and Tests Ordered: Current medicines are reviewed at length with the patient today.  Concerns regarding medicines are outlined above.  No orders of the defined types were placed in this encounter.  No orders of the defined types were placed in this encounter.   No chief complaint on file.   History of Present Illness:    Brooke Breamvangeline Mohammad is a 61 y.o. female with a hx of Dyslipidemia, HTN and palpitation  last seen one year ago. Compliance with diet, lifestyle and medications: *** Past Medical History:  Diagnosis Date  . Allergy   . Asthma   . H/O varicella   . History of measles, mumps, or rubella   . Hyperlipidemia   . Hypertension     Past Surgical History:  Procedure Laterality Date  . TUBAL LIGATION    . VAGINAL HYSTERECTOMY      Current Medications: No outpatient prescriptions have been marked as taking for the 09/30/16 encounter (Appointment) with Baldo DaubMunley, Brian J, MD.     Allergies:   Ace inhibitors; Flexeril [cyclobenzaprine hcl]; Lisinopril; Naprosyn [naproxen]; Sulfa antibiotics; and Bactrim   Social History   Social History  . Marital status: Married    Spouse name: N/A  . Number of children: N/A  . Years of education: N/A   Social History Main Topics  . Smoking status: Never Smoker  . Smokeless tobacco: Never Used  . Alcohol use No  . Drug use: No  . Sexual activity: Yes    Birth control/ protection: None   Other Topics Concern  . Not on file   Social History Narrative   Marital status:  Married x 37 years      Children: 3 daughters      Lives: with husband      Employment:  Charity fundraiserN at  [employer]      Tobacco: none      Alcohol: none     Exercise: walking at work; [employer].        Family History: The patient's ***family history includes Cancer in her maternal grandmother. ROS:   Please see the history of present illness.    All other systems reviewed and are negative.  EKGs/Labs/Other Studies Reviewed:    The following studies were reviewed today:  EKG:  EKG ordered today.  The ekg ordered today demonstrates ***  Recent Labs: No results found for requested labs within last 8760 hours.  Recent Lipid Panel    Component Value Date/Time   CHOL 167 02/17/2015 1956   TRIG 177 (H) 02/17/2015 1956   HDL 58 02/17/2015 1956   CHOLHDL 2.9 02/17/2015 1956   VLDL 35 (H) 02/17/2015 1956   LDLCALC 74 02/17/2015 1956    Physical Exam:    VS:  There were no vitals taken for this visit.    Wt Readings from Last 3 Encounters:  04/15/16 157 lb 12.8 oz (71.6 kg)  11/12/15 159 lb (72.1 kg)  06/02/15 153 lb (69.4 kg)     GEN: *** Well nourished, well developed in no acute distress HEENT: Normal NECK: No JVD;  No carotid bruits LYMPHATICS: No lymphadenopathy CARDIAC: ***RRR, no murmurs, rubs, gallops RESPIRATORY:  Clear to auscultation without rales, wheezing or rhonchi  ABDOMEN: Soft, non-tender, non-distended MUSCULOSKELETAL:  No edema; No deformity  SKIN: Warm and dry NEUROLOGIC:  Alert and oriented x 3 PSYCHIATRIC:  Normal affect    Signed, Norman Herrlich, MD  09/29/2016 7:42 AM    [facility]

## 2016-09-30 ENCOUNTER — Ambulatory Visit: Payer: Federal, State, Local not specified - PPO | Admitting: Cardiology

## 2016-09-30 DIAGNOSIS — Z8619 Personal history of other infectious and parasitic diseases: Secondary | ICD-10-CM | POA: Insufficient documentation

## 2016-09-30 DIAGNOSIS — I10 Essential (primary) hypertension: Secondary | ICD-10-CM | POA: Insufficient documentation

## 2016-09-30 DIAGNOSIS — Z6831 Body mass index (BMI) 31.0-31.9, adult: Secondary | ICD-10-CM

## 2016-09-30 DIAGNOSIS — E669 Obesity, unspecified: Secondary | ICD-10-CM

## 2016-09-30 DIAGNOSIS — E6609 Other obesity due to excess calories: Secondary | ICD-10-CM

## 2016-09-30 DIAGNOSIS — N8189 Other female genital prolapse: Secondary | ICD-10-CM | POA: Insufficient documentation

## 2016-09-30 HISTORY — DX: Obesity, unspecified: E66.9

## 2016-09-30 HISTORY — DX: Essential (primary) hypertension: I10

## 2016-09-30 HISTORY — DX: Other female genital prolapse: N81.89

## 2016-09-30 HISTORY — DX: Other obesity due to excess calories: E66.09

## 2016-10-01 ENCOUNTER — Ambulatory Visit (INDEPENDENT_AMBULATORY_CARE_PROVIDER_SITE_OTHER): Payer: Federal, State, Local not specified - PPO | Admitting: Cardiology

## 2016-10-01 ENCOUNTER — Encounter: Payer: Self-pay | Admitting: Cardiology

## 2016-10-01 VITALS — BP 144/86 | HR 90 | Ht 59.0 in | Wt 153.8 lb

## 2016-10-01 DIAGNOSIS — R079 Chest pain, unspecified: Secondary | ICD-10-CM | POA: Diagnosis not present

## 2016-10-01 DIAGNOSIS — I10 Essential (primary) hypertension: Secondary | ICD-10-CM | POA: Diagnosis not present

## 2016-10-01 MED ORDER — CARVEDILOL 6.25 MG PO TABS: 6.2500 mg | ORAL_TABLET | Freq: Two times a day (BID) | ORAL | 3 refills | Status: DC

## 2016-10-01 NOTE — Progress Notes (Signed)
Cardiology Office Note:    Date:  10/01/2016   ID:  Brooke Wilson, DOB [birth year], MRN [id number]  PCP:  [facility]  Cardiologist:  Norman Herrlich, MD    Referring MD: [facility]    ASSESSMENT:    1. Essential hypertension   2. Chest pain, unspecified type    PLAN:    In order of problems listed above:  1. Poorly controlled at carvedilol goal systolic 03/17/1938 2. Atypical she requires an ischemia evaluation feels incapable using a treadmill and will have a Lexiscan pharmacologic MPI   Next appointment: 2 weeks    Medication Adjustments/Labs and Tests Ordered: Current medicines are reviewed at length with the patient today.  Concerns regarding medicines are outlined above.  Orders Placed This Encounter  Procedures  . Myocardial Perfusion Imaging  . EKG 12-Lead   Meds ordered this encounter  Medications  . carvedilol (COREG) 6.25 MG tablet    Sig: Take 1 tablet (6.25 mg total) by mouth 2 (two) times daily.    Dispense:  180 tablet    Refill:  3    Chief Complaint  Patient presents with  . Follow-up    1 year flup   . Dizziness  . Edema  . Numbness    in left arm    History of Present Illness:    Brooke Wilson is a 61 y.o. female with a hx of hypertension, hyperlipidemia and palpitation last seen one year ago. Recently blood pressure is poorly controlled with diastolics of 100 systolic to 190-200. She is at work 1 day felt very badly week dizzy developed aching in the left arm nausea vomiting and had near loss of consciousness. This was 2 weeks ago she is worried she had heart attack. She placed herself back on amlodipine and her blood pressures are improved but still not at baseline. She has not had typical angina and no recurrent episodes Compliance with diet, lifestyle and medications: Yes Past Medical History:  Diagnosis Date  . Acute maxillary sinusitis 11/12/2015  . Allergy   . Anxiety state 05/17/2011  . AR (allergic  rhinitis) 02/17/2012  . Asthma   . BMI 31.0-31.9,adult 03/23/2011  . Cough 03/23/2011  . Dental disease 02/17/2012   [place] in [place] 01/2012   . Essential hypertension, benign 03/23/2011  . GERD (gastroesophageal reflux disease) 05/17/2011  . H/O varicella   . History of measles, mumps, or rubella   . Hyperlipemia 03/23/2011  . Hyperlipidemia   . Hypertension   . Hypertensive disorder 09/30/2016  . Insomnia 05/17/2011  . Mid back pain 02/17/2012  . Neck pain 02/17/2012  . Obesity 09/30/2016  . 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 RAD (reactive airway disease) 02/17/2012    Past Surgical History:  Procedure Laterality Date  . TUBAL LIGATION    . VAGINAL HYSTERECTOMY      Current Medications: Current Meds  Medication Sig  . albuterol (PROVENTIL HFA) 108 (90 Base) MCG/ACT inhaler INHALE 2 PUFFS INTO THE LUNGS EVERY 6 HOURS AS NEEDED FOR WHEEZING OR SHORTNESS OF BREATH  . aMILoride (MIDAMOR) 5 MG tablet Take 5 mg by mouth daily.  Marland Kitchen amLODipine (NORVASC) 10 MG tablet Take 1 tablet (10 mg total) by mouth daily. No more refills without office vistit  . aspirin 81 MG tablet Take 81 mg by mouth daily.  . baclofen (LIORESAL) 20 MG tablet Take 1 tablet (20 mg total) by mouth 2 (two) times daily.  . diazepam (VALIUM) 10 MG tablet TAKE 1  TABLET EVERY 8 HOURS AS NEEDED FOR ANXIETY  . diltiazem (CARDIZEM CD) 120 MG 24 hr capsule Take 120 mg by mouth every morning.  . diltiazem (CARDIZEM CD) 240 MG 24 hr capsule Take 240 mg by mouth daily.   . famotidine (PEPCID) 40 MG tablet Take 1 tablet (40 mg total) by mouth 2 (two) times daily.  . fluticasone (FLONASE) 50 MCG/ACT nasal spray PLACE 2 SPRAYS IN THE NOSTRIL EVERY DAY  . Fluticasone-Salmeterol (ADVAIR DISKUS) 250-50 MCG/DOSE AEPB INHALE 1 PUFF INTO THE LUNGS EVERY 12 (TWELVE) HOURS.  . hydrALAZINE (APRESOLINE) 25 MG tablet TAKE 1 TABLET BY MOUTH THREE (3) TIMES A DAY.  Marland Kitchen ibuprofen (ADVIL,MOTRIN) 800 MG tablet Take 1 tablet (800 mg total) by mouth 3  (three) times daily.  Marland Kitchen latanoprost (XALATAN) 0.005 % ophthalmic solution PLACE 1 DROP INTO BOTH EYES TWICE A DAY  . montelukast (SINGULAIR) 10 MG tablet Take 1 tablet (10 mg total) by mouth at bedtime.  Marland Kitchen omeprazole (PRILOSEC) 40 MG capsule Take 1 capsule (40 mg total) by mouth daily.  . potassium chloride (KLOR-CON 10) 10 MEQ tablet Take 3 tablets (30 mEq total) by mouth daily.  . rosuvastatin (CRESTOR) 20 MG tablet TAKE 1 TABLET BY MOUTH ONCE DIALY  . temazepam (RESTORIL) 30 MG capsule Take 30 mg by mouth at bedtime as needed for sleep.  . traMADol-acetaminophen (ULTRACET) 37.5-325 MG tablet Take 1 tablet by mouth every 8 (eight) hours as needed.  . zinc sulfate 220 MG capsule Take 1 capsule (220 mg total) by mouth daily.     Allergies:   Ace inhibitors; Iodinated diagnostic agents; Lisinopril; Sulfa antibiotics; Bactrim; Flexeril [cyclobenzaprine hcl]; Latex; and Naprosyn [naproxen]   Social History   Social History  . Marital status: Married    Spouse name: N/A  . Number of children: N/A  . Years of education: N/A   Social History Main Topics  . Smoking status: Never Smoker  . Smokeless tobacco: Never Used  . Alcohol use No  . Drug use: No  . Sexual activity: Yes    Birth control/ protection: None   Other Topics Concern  . None   Social History Narrative   Marital status:  Married x 37 years      Children: 3 daughters      Lives: with husband      Employment:  Charity fundraiser at [employer]      Tobacco: none      Alcohol: none     Exercise: walking at work; [employer].        Family History: The patient's family history includes Cancer in her maternal grandmother; Hypertension in her brother, mother, and sister; Stroke in her brother and mother. ROS:   Please see the history of present illness.    All other systems reviewed and are negative.  EKGs/Labs/Other Studies Reviewed:    The following studies were reviewed today:  EKG:  EKG ordered today.  The ekg ordered  today demonstrates Sinus rhythm normal  Recent Labs: No results found for requested labs within last 8760 hours.  Recent Lipid Panel    Component Value Date/Time   CHOL 167 02/17/2015 1956   TRIG 177 (H) 02/17/2015 1956   HDL 58 02/17/2015 1956   CHOLHDL 2.9 02/17/2015 1956   VLDL 35 (H) 02/17/2015 1956   LDLCALC 74 02/17/2015 1956    Physical Exam:    VS:  BP (!) 144/86 (BP Location: Right Arm, Patient Position: Sitting)   Pulse 90  Ht 4\' 11"  (1.499 m)   Wt 153 lb 12.8 oz (69.8 kg)   SpO2 96%   BMI 31.06 kg/m     Wt Readings from Last 3 Encounters:  10/01/16 153 lb 12.8 oz (69.8 kg)  04/15/16 157 lb 12.8 oz (71.6 kg)  11/12/15 159 lb (72.1 kg)     GEN:  Well nourished, well developed in no acute distress HEENT: Normal NECK: No JVD; No carotid bruits LYMPHATICS: No lymphadenopathy CARDIAC: RRR, no murmurs, rubs, gallops RESPIRATORY:  Clear to auscultation without rales, wheezing or rhonchi  ABDOMEN: Soft, non-tender, non-distended MUSCULOSKELETAL:  No edema; No deformity  SKIN: Warm and dry NEUROLOGIC:  Alert and oriented x 3 PSYCHIATRIC:  Normal affect    Signed, Norman Herrlich, MD  10/01/2016 4:52 PM    [facility]

## 2016-10-01 NOTE — Patient Instructions (Signed)
Medication Instructions:  Your physician has recommended you make the following change in your medication:  START carvedilol (Coreg) 6.25 mg twice daily   Labwork: None  Testing/Procedures: You had an EKG today.  Your physician has requested that you have a lexiscan myoview. For further information please visit [email or web address] Please follow instruction sheet, as given.   Follow-Up: Your physician recommends that you schedule a follow-up appointment in: 4 weeks.   Any Other Special Instructions Will Be Listed Below (If Applicable).     If you need a refill on your cardiac medications before your next appointment, please call your pharmacy.

## 2016-10-06 ENCOUNTER — Telehealth (HOSPITAL_COMMUNITY): Payer: Self-pay | Admitting: *Deleted

## 2016-10-06 NOTE — Telephone Encounter (Signed)
Attempted to call patient regarding upcoming nuclear appointment- no answer.  Brooke Wilson  

## 2016-10-07 ENCOUNTER — Ambulatory Visit (HOSPITAL_COMMUNITY): Payer: Federal, State, Local not specified - PPO | Attending: Internal Medicine

## 2016-10-07 DIAGNOSIS — R079 Chest pain, unspecified: Secondary | ICD-10-CM | POA: Diagnosis not present

## 2016-10-07 LAB — MYOCARDIAL PERFUSION IMAGING
CHL CUP NUCLEAR SDS: 9
CHL CUP NUCLEAR SRS: 2
CHL CUP NUCLEAR SSS: 11
CHL CUP RESTING HR STRESS: 75 {beats}/min
CSEPPHR: 93 {beats}/min
LV dias vol: 104 mL (ref 46–106)
LVSYSVOL: 37 mL
RATE: 0.31
TID: 1.04

## 2016-10-07 MED ORDER — REGADENOSON 0.4 MG/5ML IV SOLN
0.4000 mg | Freq: Once | INTRAVENOUS | Status: AC
  Administered 2016-10-07: 0.4 mg via INTRAVENOUS

## 2016-10-07 MED ORDER — TECHNETIUM TC 99M TETROFOSMIN IV KIT
32.9000 | PACK | Freq: Once | INTRAVENOUS | Status: AC | PRN
  Administered 2016-10-07: 32.9 via INTRAVENOUS
  Filled 2016-10-07: qty 33

## 2016-10-07 MED ORDER — TECHNETIUM TC 99M TETROFOSMIN IV KIT
10.3000 | PACK | Freq: Once | INTRAVENOUS | Status: AC | PRN
Start: 2016-10-07 — End: 2016-10-07
  Administered 2016-10-07: 10.3 via INTRAVENOUS
  Filled 2016-10-07: qty 11

## 2016-10-27 ENCOUNTER — Encounter: Payer: Self-pay | Admitting: Cardiology

## 2016-10-27 ENCOUNTER — Ambulatory Visit (INDEPENDENT_AMBULATORY_CARE_PROVIDER_SITE_OTHER): Payer: Federal, State, Local not specified - PPO | Admitting: Cardiology

## 2016-10-27 ENCOUNTER — Ambulatory Visit: Payer: Federal, State, Local not specified - PPO | Admitting: Cardiology

## 2016-10-27 VITALS — BP 138/70 | HR 80 | Ht 59.0 in | Wt 155.1 lb

## 2016-10-27 DIAGNOSIS — R079 Chest pain, unspecified: Secondary | ICD-10-CM

## 2016-10-27 DIAGNOSIS — I10 Essential (primary) hypertension: Secondary | ICD-10-CM

## 2016-10-27 MED ORDER — POTASSIUM CHLORIDE ER 10 MEQ PO TBCR: 30.0000 meq | EXTENDED_RELEASE_TABLET | Freq: Every day | ORAL | 0 refills | Status: DC

## 2016-10-27 MED ORDER — CARVEDILOL 12.5 MG PO TABS: 12.5000 mg | ORAL_TABLET | Freq: Two times a day (BID) | ORAL | 3 refills | Status: DC

## 2016-10-27 NOTE — Patient Instructions (Addendum)
Medication Instructions:   Your physician has recommended you make the following change in your medication  STOP: Carvedilol 6.25mg  twice daily START: Carvedilol 12.5mg  twice daily  Labwork: None  Testing/Procedures: None  Follow-Up:  Follow up with PCP in 2 weeks  Your physician wants you to follow-up in: 6 months. You will receive a reminder letter in the mail two months in advance. If you don't receive a letter, please call our office to schedule the follow-up appointment.   Any Other Special Instructions Will Be Listed Below (If Applicable).     If you need a refill on your cardiac medications before your next appointment, please call your pharmacy.

## 2016-10-27 NOTE — Progress Notes (Deleted)
Cardiology Office Note:    Date:  10/27/2016   ID:  Brooke BreamEvangeline Wilson, DOB 04-Aug-1955, MRN [id number]  PCP:  [facility]  Cardiologist:  Norman HerrlichBrian Andranik Jeune, MD    Referring MD: [facility]    ASSESSMENT:    No diagnosis found. PLAN:    In order of problems listed above:  1. ***   Next appointment: ***   Medication Adjustments/Labs and Tests Ordered: Current medicines are reviewed at length with the patient today.  Concerns regarding medicines are outlined above.  No orders of the defined types were placed in this encounter.  No orders of the defined types were placed in this encounter.   No chief complaint on file.   History of Present Illness:    Brooke Breamvangeline Takagi is a 61 y.o. female with a hx of poorly controlled hypertension, hyperlipidemia and palpitation last seen 1 week ago with atypical chest pain.. Compliance with diet, lifestyle and medications: *** Past Medical History:  Diagnosis Date  . Acute maxillary sinusitis 11/12/2015  . Allergy   . Anxiety state 05/17/2011  . AR (allergic rhinitis) 02/17/2012  . Asthma   . BMI 31.0-31.9,adult 03/23/2011  . Cough 03/23/2011  . Dental disease 02/17/2012   [place] 01/2012   . Essential hypertension, benign 03/23/2011  . GERD (gastroesophageal reflux disease) 05/17/2011  . H/O varicella   . History of measles, mumps, or rubella   . Hyperlipemia 03/23/2011  . Hyperlipidemia   . Hypertension   . Hypertensive disorder 09/30/2016  . Insomnia 05/17/2011  . Mid back pain 02/17/2012  . Neck pain 02/17/2012  . Obesity 09/30/2016  . 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 RAD (reactive airway disease) 02/17/2012    Past Surgical History:  Procedure Laterality Date  . TUBAL LIGATION    . VAGINAL HYSTERECTOMY      Current Medications: No outpatient prescriptions have been marked as taking for the 10/27/16 encounter (Appointment) with Baldo DaubMunley, Kiki Bivens J, MD.     Allergies:   Ace inhibitors;  Iodinated diagnostic agents; Lisinopril; Sulfa antibiotics; Bactrim; Flexeril [cyclobenzaprine hcl]; Latex; and Naprosyn [naproxen]   Social History   Social History  . Marital status: Married    Spouse name: N/A  . Number of children: N/A  . Years of education: N/A   Social History Main Topics  . Smoking status: Never Smoker  . Smokeless tobacco: Never Used  . Alcohol use No  . Drug use: No  . Sexual activity: Yes    Birth control/ protection: None   Other Topics Concern  . Not on file   Social History Narrative   Marital status:  Married x 37 years      Children: 3 daughters      Lives: with husband      Employment:  Charity fundraiserN at [employer]      Tobacco: none      Alcohol: none     Exercise: walking at work; [employer].        Family History: The patient's ***family history includes Cancer in her maternal grandmother; Hypertension in her brother, mother, and sister; Stroke in her brother and mother. ROS:   Please see the history of present illness.    All other systems reviewed and are negative.  EKGs/Labs/Other Studies Reviewed:    The following studies were reviewed today: MPI: Study Highlights     Nuclear stress EF: 64%.  There was no ST segment deviation noted during stress.  The study is normal.  The left ventricular ejection fraction is normal (55-65%).   1. EF 64% with normal wall motion.  2. No evidence for ischemia or infarction by perfusion images.      EKG:  EKG ordered today.  The ekg ordered today demonstrates ***  Recent Labs: No results found for requested labs within last 8760 hours.  Recent Lipid Panel    Component Value Date/Time   CHOL 167 02/17/2015 1956   TRIG 177 (H) 02/17/2015 1956   HDL 58 02/17/2015 1956   CHOLHDL 2.9 02/17/2015 1956   VLDL 35 (H) 02/17/2015 1956   LDLCALC 74 02/17/2015 1956    Physical Exam:    VS:  There were no vitals taken for this visit.    Wt Readings from Last 3 Encounters:  10/07/16 153  lb (69.4 kg)  10/01/16 153 lb 12.8 oz (69.8 kg)  04/15/16 157 lb 12.8 oz (71.6 kg)     GEN: *** Well nourished, well developed in no acute distress HEENT: Normal NECK: No JVD; No carotid bruits LYMPHATICS: No lymphadenopathy CARDIAC: ***RRR, no murmurs, rubs, gallops RESPIRATORY:  Clear to auscultation without rales, wheezing or rhonchi  ABDOMEN: Soft, non-tender, non-distended MUSCULOSKELETAL:  No edema; No deformity  SKIN: Warm and dry NEUROLOGIC:  Alert and oriented x 3 PSYCHIATRIC:  Normal affect    Signed, Norman Herrlich, MD  10/27/2016 8:51 AM    [facility]

## 2016-10-27 NOTE — Progress Notes (Signed)
Cardiology Office Note:    Date:  10/27/2016   ID:  Brooke Wilson, DOB 1955/02/23, MRN [id number]  PCP:  [facility]  Cardiologist:  Norman Herrlich, MD   Referring MD: [facility]  ASSESSMENT:    1. Chest pain in adult   2. Essential hypertension    PLAN:    In order of problems listed above:  1. Very atypical nonanginal unfortunately has a normal myocardial perfusion study. At this time I would not refer to coronary angiography 2. Not at target, she'll up titrate her beta blocker continue current multidrug regimen and have a blood pressure check in the next few weeks with her PCP  Next appointment 6 months, I asked her to have a blood pressure check with her PCP in the two-week interval   Medication Adjustments/Labs and Tests Ordered: Current medicines are reviewed at length with the patient today.  Concerns regarding medicines are outlined above.  No orders of the defined types were placed in this encounter.  Meds ordered this encounter  Medications  . carvedilol (COREG) 12.5 MG tablet    Sig: Take 1 tablet (12.5 mg total) by mouth 2 (two) times daily.    Dispense:  60 tablet    Refill:  3  . potassium chloride (KLOR-CON 10) 10 MEQ tablet    Sig: Take 3 tablets (30 mEq total) by mouth daily.    Dispense:  21 tablet    Refill:  0    NO MORE REFILLS WITHOUT OFFICE VISIT - 2ND NOTICE     Chief Complaint  Patient presents with  . Follow-up    after nuclear stress test     History of Present Illness:    Brooke Wilson is a 61 y.o. female who is being seen today for the evaluation of hypertension, hyperlipidemia, palpitation and chest pain at the request of [facility].  She is under intense work and family stress with an illness of her sister. Home blood pressure typically runs 150-170 systolic and she will uptitrate her beta blocker. She's had no angina but continues to have discomfort intermittently in the left upper  extremity. Her myocardial perfusion study was normal   Past Medical History:  Diagnosis Date  . Acute maxillary sinusitis 11/12/2015  . Allergy   . Anxiety state 05/17/2011  . AR (allergic rhinitis) 02/17/2012  . Asthma   . BMI 31.0-31.9,adult 03/23/2011  . Cough 03/23/2011  . Dental disease 02/17/2012   [place] in [place] 01/2012   . Essential hypertension, benign 03/23/2011  . GERD (gastroesophageal reflux disease) 05/17/2011  . H/O varicella   . History of measles, mumps, or rubella   . Hyperlipemia 03/23/2011  . Hyperlipidemia   . Hypertension   . Hypertensive disorder 09/30/2016  . Insomnia 05/17/2011  . Mid back pain 02/17/2012  . Neck pain 02/17/2012  . Obesity 09/30/2016  . 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 RAD (reactive airway disease) 02/17/2012    Past Surgical History:  Procedure Laterality Date  . TUBAL LIGATION    . VAGINAL HYSTERECTOMY      Current Medications: Current Meds  Medication Sig  . albuterol (PROVENTIL HFA) 108 (90 Base) MCG/ACT inhaler INHALE 2 PUFFS INTO THE LUNGS EVERY 6 HOURS AS NEEDED FOR WHEEZING OR SHORTNESS OF BREATH  . aMILoride (MIDAMOR) 5 MG tablet Take 5 mg by mouth daily.  Marland Kitchen amLODipine (NORVASC) 10 MG tablet Take 1 tablet (10 mg total) by mouth daily. No more refills without office vistit  .  aspirin 81 MG tablet Take 81 mg by mouth daily.  . baclofen (LIORESAL) 20 MG tablet Take 1 tablet (20 mg total) by mouth 2 (two) times daily.  . carvedilol (COREG) 12.5 MG tablet Take 1 tablet (12.5 mg total) by mouth 2 (two) times daily.  . diazepam (VALIUM) 10 MG tablet TAKE 1 TABLET EVERY 8 HOURS AS NEEDED FOR ANXIETY  . diltiazem (CARDIZEM CD) 120 MG 24 hr capsule Take 120 mg by mouth every morning.  . diltiazem (CARDIZEM CD) 240 MG 24 hr capsule Take 240 mg by mouth daily.   . famotidine (PEPCID) 40 MG tablet Take 1 tablet (40 mg total) by mouth 2 (two) times daily.  . fluticasone (FLONASE) 50 MCG/ACT nasal spray PLACE 2 SPRAYS IN THE NOSTRIL EVERY  DAY  . Fluticasone-Salmeterol (ADVAIR DISKUS) 250-50 MCG/DOSE AEPB INHALE 1 PUFF INTO THE LUNGS EVERY 12 (TWELVE) HOURS.  . hydrALAZINE (APRESOLINE) 25 MG tablet TAKE 1 TABLET BY MOUTH THREE (3) TIMES A DAY.  Marland Kitchen. ibuprofen (ADVIL,MOTRIN) 800 MG tablet Take 1 tablet (800 mg total) by mouth 3 (three) times daily.  Marland Kitchen. latanoprost (XALATAN) 0.005 % ophthalmic solution PLACE 1 DROP INTO BOTH EYES TWICE A DAY  . montelukast (SINGULAIR) 10 MG tablet Take 1 tablet (10 mg total) by mouth at bedtime.  Marland Kitchen. omeprazole (PRILOSEC) 40 MG capsule Take 1 capsule (40 mg total) by mouth daily.  . potassium chloride (KLOR-CON 10) 10 MEQ tablet Take 3 tablets (30 mEq total) by mouth daily.  . rosuvastatin (CRESTOR) 20 MG tablet TAKE 1 TABLET BY MOUTH ONCE DIALY  . temazepam (RESTORIL) 30 MG capsule Take 30 mg by mouth at bedtime as needed for sleep.  . traMADol-acetaminophen (ULTRACET) 37.5-325 MG tablet Take 1 tablet by mouth every 8 (eight) hours as needed.  . zinc sulfate 220 MG capsule Take 1 capsule (220 mg total) by mouth daily.  . [DISCONTINUED] carvedilol (COREG) 6.25 MG tablet Take 1 tablet (6.25 mg total) by mouth 2 (two) times daily.  . [DISCONTINUED] potassium chloride (KLOR-CON 10) 10 MEQ tablet Take 3 tablets (30 mEq total) by mouth daily.     Allergies:   Ace inhibitors; Iodinated diagnostic agents; Lisinopril; Sulfa antibiotics; Bactrim; Flexeril [cyclobenzaprine hcl]; Latex; and Naprosyn [naproxen]   Social History   Social History  . Marital status: Married    Spouse name: N/A  . Number of children: N/A  . Years of education: N/A   Social History Main Topics  . Smoking status: Never Smoker  . Smokeless tobacco: Never Used  . Alcohol use No  . Drug use: No  . Sexual activity: Yes    Birth control/ protection: None   Other Topics Concern  . 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        Family History: The patient's family history includes Cancer in her maternal grandmother; Hypertension in her brother, mother, and sister; Stroke in her brother and mother.  ROS:   ROS Please see the history of present illness.     All other systems reviewed and are negative.  EKGs/Labs/Other Studies Reviewed:    The following studies were reviewed today:  MPI:  Study Highlights     Nuclear stress EF: 64%.  There was no ST segment deviation noted during stress.  The study is normal.  The left ventricular ejection fraction is normal (55-65%).   1. EF 64% with normal wall motion.  2. No evidence for ischemia or infarction by perfusion images.     Recent Labs: No results found for requested labs within last 8760 hours.  Recent Lipid Panel    Component Value Date/Time   CHOL 167 02/17/2015 1956   TRIG 177 (H) 02/17/2015 1956   HDL 58 02/17/2015 1956   CHOLHDL 2.9 02/17/2015 1956   VLDL 35 (H) 02/17/2015 1956   LDLCALC 74 02/17/2015 1956    Physical Exam:    VS:  BP 138/70 (BP Location: Right Arm, Patient Position: Sitting)   Pulse 80   Ht  (1.499 m)   Wt 155 lb 1.9 oz (70.4 kg)   SpO2 98%   BMI 31.33 kg/m     Wt Readings from Last 3 Encounters:  10/27/16 155 lb 1.9 oz (70.4 kg)  10/07/16 153 lb (69.4 kg)  10/01/16 153 lb 12.8 oz (69.8 kg)     GEN:  Well nourished, well developed in no acute distress HEENT: Normal NECK: No JVD; No carotid bruits LYMPHATICS: No lymphadenopathy CARDIAC: RRR, no murmurs, rubs, gallops RESPIRATORY:  Clear to auscultation without rales, wheezing or rhonchi  ABDOMEN: Soft, non-tender, non-distended MUSCULOSKELETAL:  No edema; No deformity  SKIN: Warm and dry NEUROLOGIC:  Alert and oriented x 3 PSYCHIATRIC:  Normal affect     Signed, Norman Herrlich, MD  10/27/2016 1:36 PM    [facility]

## 2016-10-28 ENCOUNTER — Ambulatory Visit: Payer: Federal, State, Local not specified - PPO | Admitting: Cardiology

## 2016-10-29 ENCOUNTER — Ambulatory Visit: Payer: Federal, State, Local not specified - PPO | Admitting: Cardiology

## 2016-11-01 DIAGNOSIS — E782 Mixed hyperlipidemia: Secondary | ICD-10-CM | POA: Diagnosis not present

## 2016-11-01 DIAGNOSIS — Z79899 Other long term (current) drug therapy: Secondary | ICD-10-CM | POA: Diagnosis not present

## 2016-11-01 DIAGNOSIS — G4700 Insomnia, unspecified: Secondary | ICD-10-CM | POA: Diagnosis not present

## 2016-11-01 DIAGNOSIS — I10 Essential (primary) hypertension: Secondary | ICD-10-CM | POA: Diagnosis not present

## 2016-11-03 ENCOUNTER — Ambulatory Visit: Payer: Federal, State, Local not specified - PPO | Admitting: Cardiology

## 2016-11-13 ENCOUNTER — Ambulatory Visit (INDEPENDENT_AMBULATORY_CARE_PROVIDER_SITE_OTHER): Payer: Federal, State, Local not specified - PPO | Admitting: Family Medicine

## 2016-11-13 ENCOUNTER — Encounter: Payer: Self-pay | Admitting: Family Medicine

## 2016-11-13 VITALS — BP 130/68 | HR 79 | Temp 99.0°F | Resp 16 | Ht 59.45 in | Wt 157.0 lb

## 2016-11-13 DIAGNOSIS — Z01419 Encounter for gynecological examination (general) (routine) without abnormal findings: Secondary | ICD-10-CM | POA: Diagnosis not present

## 2016-11-13 DIAGNOSIS — F99 Mental disorder, not otherwise specified: Secondary | ICD-10-CM | POA: Diagnosis not present

## 2016-11-13 DIAGNOSIS — E6609 Other obesity due to excess calories: Secondary | ICD-10-CM | POA: Diagnosis not present

## 2016-11-13 DIAGNOSIS — M542 Cervicalgia: Secondary | ICD-10-CM

## 2016-11-13 DIAGNOSIS — Z0000 Encounter for general adult medical examination without abnormal findings: Secondary | ICD-10-CM

## 2016-11-13 DIAGNOSIS — K219 Gastro-esophageal reflux disease without esophagitis: Secondary | ICD-10-CM

## 2016-11-13 DIAGNOSIS — Z23 Encounter for immunization: Secondary | ICD-10-CM | POA: Diagnosis not present

## 2016-11-13 DIAGNOSIS — I10 Essential (primary) hypertension: Secondary | ICD-10-CM

## 2016-11-13 DIAGNOSIS — E7800 Pure hypercholesterolemia, unspecified: Secondary | ICD-10-CM | POA: Diagnosis not present

## 2016-11-13 DIAGNOSIS — N8189 Other female genital prolapse: Secondary | ICD-10-CM | POA: Diagnosis not present

## 2016-11-13 DIAGNOSIS — J301 Allergic rhinitis due to pollen: Secondary | ICD-10-CM

## 2016-11-13 DIAGNOSIS — F5105 Insomnia due to other mental disorder: Secondary | ICD-10-CM | POA: Diagnosis not present

## 2016-11-13 DIAGNOSIS — Z6831 Body mass index (BMI) 31.0-31.9, adult: Secondary | ICD-10-CM

## 2016-11-13 LAB — POC MICROSCOPIC URINALYSIS (UMFC): Mucus: ABSENT

## 2016-11-13 LAB — POCT URINALYSIS DIP (MANUAL ENTRY)
BILIRUBIN UA: NEGATIVE mg/dL
Bilirubin, UA: NEGATIVE
Blood, UA: NEGATIVE
Glucose, UA: NEGATIVE mg/dL
LEUKOCYTES UA: NEGATIVE
Nitrite, UA: NEGATIVE
PH UA: 5.5 (ref 5.0–8.0)
PROTEIN UA: NEGATIVE mg/dL
SPEC GRAV UA: 1.025 (ref 1.010–1.025)
UROBILINOGEN UA: 0.2 U/dL

## 2016-11-13 MED ORDER — TEMAZEPAM 15 MG PO CAPS: 15.0000 mg | ORAL_CAPSULE | Freq: Every evening | ORAL | 0 refills | Status: DC | PRN

## 2016-11-13 MED ORDER — OMEPRAZOLE 40 MG PO CPDR: 40.0000 mg | DELAYED_RELEASE_CAPSULE | Freq: Every day | ORAL | 3 refills | Status: DC

## 2016-11-13 MED ORDER — ZOSTER VAC RECOMB ADJUVANTED 50 MCG/0.5ML IM SUSR: 0.5000 mL | Freq: Once | INTRAMUSCULAR | 1 refills | Status: AC

## 2016-11-13 NOTE — Patient Instructions (Addendum)
IF you received an x-ray today, you will receive an invoice from [facility]. Please contact [facility] at [phone] with questions or concerns regarding your invoice.   IF you received labwork today, you will receive an invoice from [facility]. Please contact [facility] at [phone] with questions or concerns regarding your invoice.   Our billing staff will not be able to assist you with questions regarding bills from these companies.  You will be contacted with the lab results as soon as they are available. The fastest way to get your results is to activate your My Chart account. Instructions are located on the last page of this paperwork. If you have not heard from [place] regarding the results in 2 weeks, please contact this office.      Preventive Care 40-64 Years, Female Preventive care refers to lifestyle choices and visits with your health care provider that can promote health and wellness. What does preventive care include?  A yearly physical exam. This is also called an annual well check.  Dental exams once or twice a year.  Routine eye exams. Ask your health care provider how often you should have your eyes checked.  Personal lifestyle choices, including: ? Daily care of your teeth and gums. ? Regular physical activity. ? Eating a healthy diet. ? Avoiding tobacco and drug use. ? Limiting alcohol use. ? Practicing safe sex. ? Taking low-dose aspirin daily starting at age 34. ? Taking vitamin and mineral supplements as recommended by your health care provider. What happens during an annual well check? The services and screenings done by your health care provider during your annual well check will depend on your age, overall health, lifestyle risk factors, and family history of disease. Counseling Your health care provider may ask you questions about your:  Alcohol use.  Tobacco use.  Drug use.  Emotional well-being.  Home and relationship  well-being.  Sexual activity.  Eating habits.  Work and work Statistician.  Method of birth control.  Menstrual cycle.  Pregnancy history.  Screening You may have the following tests or measurements:  Height, weight, and BMI.  Blood pressure.  Lipid and cholesterol levels. These may be checked every 5 years, or more frequently if you are over 65 years old.  Skin check.  Lung cancer screening. You may have this screening every year starting at age 102 if you have a 30-pack-year history of smoking and currently smoke or have quit within the past 15 years.  Fecal occult blood test (FOBT) of the stool. You may have this test every year starting at age 33.  Flexible sigmoidoscopy or colonoscopy. You may have a sigmoidoscopy every 5 years or a colonoscopy every 10 years starting at age 23.  Hepatitis C blood test.  Hepatitis B blood test.  Sexually transmitted disease (STD) testing.  Diabetes screening. This is done by checking your blood sugar (glucose) after you have not eaten for a while (fasting). You may have this done every 1-3 years.  Mammogram. This may be done every 1-2 years. Talk to your health care provider about when you should start having regular mammograms. This may depend on whether you have a family history of breast cancer.  BRCA-related cancer screening. This may be done if you have a family history of breast, ovarian, tubal, or peritoneal cancers.  Pelvic exam and Pap test. This may be done every 3 years starting at age 3. Starting at age 55, this may be done every 5 years if  you have a Pap test in combination with an HPV test.  Bone density scan. This is done to screen for osteoporosis. You may have this scan if you are at high risk for osteoporosis.  Discuss your test results, treatment options, and if necessary, the need for more tests with your health care provider. Vaccines Your health care provider may recommend certain vaccines, such  as:  Influenza vaccine. This is recommended every year.  Tetanus, diphtheria, and acellular pertussis (Tdap, Td) vaccine. You may need a Td booster every 10 years.  Varicella vaccine. You may need this if you have not been vaccinated.  Zoster vaccine. You may need this after age 70.  Measles, mumps, and rubella (MMR) vaccine. You may need at least one dose of MMR if you were born in 1957 or later. You may also need a second dose.  Pneumococcal 13-valent conjugate (PCV13) vaccine. You may need this if you have certain conditions and were not previously vaccinated.  Pneumococcal polysaccharide (PPSV23) vaccine. You may need one or two doses if you smoke cigarettes or if you have certain conditions.  Meningococcal vaccine. You may need this if you have certain conditions.  Hepatitis A vaccine. You may need this if you have certain conditions or if you travel or work in places where you may be exposed to hepatitis A.  Hepatitis B vaccine. You may need this if you have certain conditions or if you travel or work in places where you may be exposed to hepatitis B.  Haemophilus influenzae type b (Hib) vaccine. You may need this if you have certain conditions.  Talk to your health care provider about which screenings and vaccines you need and how often you need them. This information is not intended to replace advice given to you by your health care provider. Make sure you discuss any questions you have with your health care provider. Document Released: 02/28/2015 Document Revised: 10/22/2015 Document Reviewed: 12/03/2014 Elsevier Interactive Patient Education  2017 [employer].

## 2016-11-13 NOTE — Progress Notes (Signed)
Subjective:    Patient ID: Brooke Wilson, female    DOB: Nov 01, 1955, 61 y.o.   MRN: [id number]  11/13/2016  Gynecologic Exam   HPI This 61 y.o. female presents for Complete Physical Examination and chronic medical follow-up on chronic medical conditions.  Last physical:  Last year. Pap smear: Mammogram: 2016; 2018 [facility] Colonoscopy:  Elsie Amis; 3 years ago.repeat in ten years Eye exam:  [place]. Dental exam:  [place]  No exam data present  BP Readings from Last 3 Encounters:  11/13/16 130/68  10/27/16 138/70  10/01/16 (!) 144/86   Wt Readings from Last 3 Encounters:  11/13/16 157 lb (71.2 kg)  10/27/16 155 lb 1.9 oz (70.4 kg)  10/07/16 153 lb (69.4 kg)   Immunization History  Administered Date(s) Administered  . Influenza,inj,quad, With Preservative 11/16/2014  . Influenza-Unspecified 11/13/2014, 12/17/2015  . Td 12/17/2006   Asthma: twice daily Advair; PRN Proair.  Singulair  daily.  Allergic rhinitis: flonase daily.   Hypokalemia: Eliott Nine started Amiloride . Klor-con.  DDD cervical and lumbar: taking Baclofen bid to tid. Valium really helps spasm; taking it two times daily PRN. Ibuprofen  every 8 hours.Tramadol for chronic pain.  Established with [facility] yet not prescribing anything different; provider is moving practice to [facility] and pt does not want to travel that far.  HTN: Amlodipine and Carvedilol.  Diltiazem CD  am and  pm.  Apresoline  tid.  GERD: Pepcid  bid.  Prilosec  daily.   Hypercholesterolemia: Crestor  daily  Insomnia: Temazepam qhs PRN; sparingly.   Review of Systems  Constitutional: Negative for activity change, appetite change, chills, diaphoresis, fatigue, fever and unexpected weight change.  HENT: Positive for congestion, postnasal drip and rhinorrhea. Negative for dental problem, drooling, ear discharge, ear pain, facial swelling, hearing loss, mouth sores, nosebleeds, sinus pain,  sinus pressure, sneezing, sore throat, tinnitus, trouble swallowing and voice change.   Eyes: Negative for photophobia, pain, discharge, redness, itching and visual disturbance.  Respiratory: Negative for apnea, cough, choking, chest tightness, shortness of breath, wheezing and stridor.   Cardiovascular: Negative for chest pain, palpitations and leg swelling.  Gastrointestinal: Negative for abdominal distention, abdominal pain, anal bleeding, blood in stool, constipation, diarrhea, nausea, rectal pain and vomiting.  Endocrine: Negative for cold intolerance, heat intolerance, polydipsia, polyphagia and polyuria.  Genitourinary: Negative for decreased urine volume, difficulty urinating, dyspareunia, dysuria, enuresis, flank pain, frequency, genital sores, hematuria, menstrual problem, pelvic pain, urgency, vaginal bleeding, vaginal discharge and vaginal pain.       Nocturia x 2-3.  Urinary incontinence stress.  Wears pad.  Musculoskeletal: Positive for neck pain and neck stiffness. Negative for arthralgias, back pain, gait problem, joint swelling and myalgias.  Skin: Negative for color change, pallor, rash and wound.  Allergic/Immunologic: Negative for environmental allergies, food allergies and immunocompromised state.  Neurological: Negative for dizziness, tremors, seizures, syncope, facial asymmetry, speech difficulty, weakness, light-headedness, numbness and headaches.  Hematological: Negative for adenopathy. Does not bruise/bleed easily.  Psychiatric/Behavioral: Positive for sleep disturbance. Negative for agitation, behavioral problems, confusion, decreased concentration, dysphoric mood, hallucinations, self-injury and suicidal ideas. The patient is not nervous/anxious and is not hyperactive.        Bedtime 12:00am; wakes up 5:30am.      Past Medical History:  Diagnosis Date  . Acute maxillary sinusitis 11/12/2015  . Allergy   . Anxiety state 05/17/2011  . AR (allergic rhinitis) 02/17/2012  .  Asthma   . BMI 31.0-31.9,adult 03/23/2011  . Cough 03/23/2011  .  Dental disease 02/17/2012   [place] replaced in [place] 01/2012   . Essential hypertension, benign 03/23/2011  . GERD (gastroesophageal reflux disease) 05/17/2011  . H/O varicella   . History of measles, mumps, or rubella   . Hyperlipemia 03/23/2011  . Hyperlipidemia   . Hypertension   . Hypertensive disorder 09/30/2016  . Insomnia 05/17/2011  . Mid back pain 02/17/2012  . Neck pain 02/17/2012  . Obesity 09/30/2016  . 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 RAD (reactive airway disease) 02/17/2012   Past Surgical History:  Procedure Laterality Date  . TUBAL LIGATION    . VAGINAL HYSTERECTOMY     uterine prolapse; DUB; ovaries intact.  [place]   Allergies  Allergen Reactions  . Ace Inhibitors Cough  . Iodinated Diagnostic Agents   . Lisinopril Cough  . Sulfa Antibiotics   . Bactrim Rash  . Flexeril [Cyclobenzaprine Hcl] Nausea Only  . Latex Itching  . Naprosyn [Naproxen] Nausea And Vomiting    And high doses of NSAIDS    Social History   Social History  . Marital status: Married    Spouse name: N/A  . Number of children: N/A  . Years of education: N/A   Occupational History  . Not on file.   Social History Main Topics  . Smoking status: Never Smoker  . Smokeless tobacco: Never Used  . Alcohol use No  . Drug use: No  . Sexual activity: Yes    Birth control/ protection: Post-menopausal, Surgical   Other Topics Concern  . Not on file   Social History Narrative   Marital status:  Married x 38 years; from the [place]; moved to [place] in 1987.      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      Family History  Problem Relation Age of Onset  . Hypertension Mother   . Stroke Mother   . Hypertension  Brother   . Stroke Brother   . Cancer Maternal Grandmother        ovarian  . Hypertension Sister   . Stroke Sister 62       CVA       Objective:    BP 130/68   Pulse 79   Temp 99 F (37.2 C) (Oral)   Resp 16   Ht 4' 11.45" (1.51 m)   Wt 157 lb (71.2 kg)   SpO2 95%   BMI 31.23 kg/m  Physical Exam  Constitutional: She is oriented to person, place, and time. She appears well-developed and well-nourished. No distress.  HENT:  Head: Normocephalic and atraumatic.  Right Ear: External ear normal.  Left Ear: External ear normal.  Nose: Nose normal.  Mouth/Throat: Oropharynx is clear and moist.  Eyes: Pupils are equal, round, and reactive to light. Conjunctivae and EOM are normal.  Neck: Normal range of motion and full passive range of motion without pain. Neck supple. No JVD present. Carotid bruit is not present. No thyromegaly present.  Cardiovascular: Normal rate, regular rhythm and normal heart sounds.  Exam reveals no gallop and no friction rub.   No murmur heard. Pulmonary/Chest: Effort normal and breath sounds normal. She has no wheezes. She  has no rales. Right breast exhibits no inverted nipple, no mass, no nipple discharge, no skin change and no tenderness. Left breast exhibits no inverted nipple, no mass, no nipple discharge, no skin change and no tenderness. Breasts are symmetrical.  Abdominal: Soft. Bowel sounds are normal. She exhibits no distension and no mass. There is no tenderness. There is no rebound and no guarding.  Genitourinary: Vagina normal. There is no rash, tenderness, lesion or injury on the right labia. There is no rash, tenderness, lesion or injury on the left labia. Right adnexum displays no mass, no tenderness and no fullness. Left adnexum displays no mass, no tenderness and no fullness.  Musculoskeletal:       Right shoulder: Normal.       Left shoulder: Normal.       Cervical back: Normal.  Lymphadenopathy:    She has no cervical adenopathy.    Neurological: She is alert and oriented to person, place, and time. She has normal reflexes. No cranial nerve deficit. She exhibits normal muscle tone. Coordination normal.  Skin: Skin is warm and dry. No rash noted. She is not diaphoretic. No erythema. No pallor.  Psychiatric: She has a normal mood and affect. Her behavior is normal. Judgment and thought content normal.  Nursing note and vitals reviewed.   No results found. Depression screen [facility] 2/9 11/13/2016 04/15/2016 11/12/2015 06/02/2015 03/15/2015  Decreased Interest 0 0 0 0 0  Down, Depressed, Hopeless 0 0 0 0 0  PHQ - 2 Score 0 0 0 0 0   Fall Risk  11/13/2016 04/15/2016 11/12/2015 02/17/2015  Falls in the past year? No No No No        Assessment & Plan:   1. Routine physical examination   2. Need for shingles vaccine   3. Encounter for gynecological examination without abnormal finding   4. Class 1 obesity due to excess calories with serious comorbidity and body mass index (BMI) of 31.0 to 31.9 in adult   5. Essential hypertension, benign   6. Seasonal allergic rhinitis due to pollen   7. Gastroesophageal reflux disease without esophagitis   8. Pure hypercholesterolemia   9. Insomnia due to other mental disorder   10. Pelvic floor relaxation   11. Neck pain    -anticipatory guidance provided --- exercise, weight loss, safe driving practices, aspirin  daily. -obtain age appropriate screening labs and labs for chronic disease management. -pt refused blood work as recently underwent blood work at [facility]; obtain ROS. -pap smear obtained yet hysterectomy status.  Obtain u/a. -refill of Omeprazole  daily provided. -advised patient last year that should not be maintained on two benzos; requesting refill of temazepam; will help with wean; provided with temazepam  for one month; can call for refill and will provide 7.5mg  for one month and then stop. -also advise that cannot be maintained on valium for muscle  relaxer if I am to refill medications instead of pain specialist at [facility].  I do not prescribe tid benzos scheduled due to risk of dependence and dementia.    Orders Placed This Encounter  Procedures  . POCT urinalysis dipstick  . POCT Microscopic Urinalysis ([facility])   Meds ordered this encounter  Medications  . Zoster Vac Recomb Adjuvanted [facility]) injection    Sig: Inject 0.5 mLs into the muscle once.    Dispense:  0.5 mL    Refill:  1  . omeprazole (PRILOSEC) 40 MG capsule    Sig: Take 1  capsule (40 mg total) by mouth daily.    Dispense:  90 capsule    Refill:  3  . temazepam (RESTORIL) 15 MG capsule    Sig: Take 1 capsule (15 mg total) by mouth at bedtime as needed for sleep.    Dispense:  30 capsule    Refill:  0    No Follow-up on file.   Maycen Degregory Paulita Fujita, M.D. Primary Care at [facility] previously [facility] [address] [place], [place]  [address] [phone] phone [phone] fax

## 2016-11-15 ENCOUNTER — Other Ambulatory Visit: Payer: Self-pay

## 2016-11-15 NOTE — Telephone Encounter (Signed)
Left message for pt to return call to verify dosage of hydralazine.

## 2016-11-16 ENCOUNTER — Encounter: Payer: Self-pay | Admitting: Family Medicine

## 2016-11-16 LAB — PAP IG W/ RFLX HPV ASCU: PAP SMEAR COMMENT: 0

## 2016-11-19 MED ORDER — HYDRALAZINE HCL 25 MG PO TABS: 25.0000 mg | ORAL_TABLET | Freq: Three times a day (TID) | ORAL | 1 refills | Status: DC

## 2016-11-24 ENCOUNTER — Other Ambulatory Visit: Payer: Self-pay | Admitting: Family Medicine

## 2017-01-07 ENCOUNTER — Other Ambulatory Visit: Payer: Self-pay | Admitting: Family Medicine

## 2017-01-07 ENCOUNTER — Telehealth: Payer: Self-pay | Admitting: Family Medicine

## 2017-01-07 ENCOUNTER — Ambulatory Visit: Payer: Self-pay | Admitting: *Deleted

## 2017-01-07 NOTE — Telephone Encounter (Signed)
Pt called requesting refills on her Amiloride, Cardizem, and Klor-con. She only has 3 days left on the Amiloride and non left on the Cardizem and Klor-con.

## 2017-01-07 NOTE — Telephone Encounter (Signed)
  Attempted to call patient to clarify the medication that she needs to refill. The call could not go thru.

## 2017-01-07 NOTE — Telephone Encounter (Unsigned)
Copied from CRM [id number]. Topic: Quick Communication - See Telephone Encounter >> Jan 07, 2017  1:34 PM Waymon AmatoBurton, Donna F wrote: CRM for notification. See Telephone encounter for:  Pt is needing a refill on I believe to be amlodipine  very hard to understand the patient the meds could be amlodipine or amiloride  Best number   01/07/17.

## 2017-01-10 ENCOUNTER — Telehealth: Payer: Self-pay | Admitting: Family Medicine

## 2017-01-10 NOTE — Telephone Encounter (Signed)
Attempted to contacted pt at [phone] regarding clarification of medication refill request; cell phone voice mail pre-recorded message stating the person is not available; able to contact pt at home phone [phone]; pt clarified that she needs refills on the following medications: cardizem 240 mg and 120 mg; almildoride 5 mg; and klorcon 10 meq (3 tablets daily); [facility] [address]; this pt is seen by Nilda SimmerKristi Smith; [place] [facility] [place] [facility].

## 2017-01-17 ENCOUNTER — Other Ambulatory Visit: Payer: Self-pay | Admitting: Family Medicine

## 2017-02-01 ENCOUNTER — Other Ambulatory Visit: Payer: Self-pay | Admitting: Family Medicine

## 2017-02-01 DIAGNOSIS — I10 Essential (primary) hypertension: Secondary | ICD-10-CM

## 2017-02-27 ENCOUNTER — Other Ambulatory Visit: Payer: Self-pay | Admitting: Family Medicine

## 2017-03-13 ENCOUNTER — Other Ambulatory Visit: Payer: Self-pay | Admitting: Family Medicine

## 2017-03-15 NOTE — Telephone Encounter (Signed)
Both prescriptions will be new Rx by provider. Listed as historical on medication list.  OV - Sept 2018- new patient/establish care  [facility] [address]

## 2017-03-17 NOTE — Telephone Encounter (Signed)
Patient is requesting a refill of the following medications: Requested Prescriptions   Pending Prescriptions Disp Refills  . diltiazem (CARDIZEM CD) 120 MG 24 hr capsule [Pharmacy Med Name: DILTIAZEM 24HR ER 120 MG CAP] 90 capsule 0    Sig: TAKE 1 CAPSULE BY MOUTH EVERY DAY IN THE MORNING  . aMILoride (MIDAMOR) 5 MG tablet [Pharmacy Med Name: AMILORIDE HCL 5 MG TABLET] 90 tablet 0    Sig: TAKE 1 TABLET BY MOUTH DAILY    Date of patient request: 03/15/17 Last office visit: 11/13/16 Date of last refill: 01/07/17 Last refill amount: 90 tabs 1 RF  Follow up time period per chart: 05/13/17 No f/u scheduled at this time

## 2017-04-22 ENCOUNTER — Encounter: Payer: Self-pay | Admitting: Family Medicine

## 2017-04-22 ENCOUNTER — Ambulatory Visit: Payer: Federal, State, Local not specified - PPO | Admitting: Family Medicine

## 2017-04-22 ENCOUNTER — Other Ambulatory Visit: Payer: Self-pay

## 2017-04-22 VITALS — BP 142/77 | HR 83 | Temp 98.0°F | Resp 16 | Ht 59.84 in | Wt 153.0 lb

## 2017-04-22 DIAGNOSIS — M542 Cervicalgia: Secondary | ICD-10-CM

## 2017-04-22 DIAGNOSIS — K2100 Gastro-esophageal reflux disease with esophagitis, without bleeding: Secondary | ICD-10-CM

## 2017-04-22 DIAGNOSIS — F5104 Psychophysiologic insomnia: Secondary | ICD-10-CM

## 2017-04-22 DIAGNOSIS — Z1159 Encounter for screening for other viral diseases: Secondary | ICD-10-CM | POA: Diagnosis not present

## 2017-04-22 DIAGNOSIS — J029 Acute pharyngitis, unspecified: Secondary | ICD-10-CM

## 2017-04-22 DIAGNOSIS — E7800 Pure hypercholesterolemia, unspecified: Secondary | ICD-10-CM | POA: Diagnosis not present

## 2017-04-22 DIAGNOSIS — J0101 Acute recurrent maxillary sinusitis: Secondary | ICD-10-CM | POA: Diagnosis not present

## 2017-04-22 DIAGNOSIS — Z114 Encounter for screening for human immunodeficiency virus [HIV]: Secondary | ICD-10-CM

## 2017-04-22 DIAGNOSIS — I10 Essential (primary) hypertension: Secondary | ICD-10-CM

## 2017-04-22 DIAGNOSIS — G8929 Other chronic pain: Secondary | ICD-10-CM

## 2017-04-22 LAB — POCT RAPID STREP A (OFFICE): RAPID STREP A SCREEN: NEGATIVE

## 2017-04-22 MED ORDER — AMOXICILLIN-POT CLAVULANATE 875-125 MG PO TABS: 1.0000 | ORAL_TABLET | Freq: Two times a day (BID) | ORAL | 0 refills | Status: DC

## 2017-04-22 MED ORDER — DIAZEPAM 10 MG PO TABS: 10.0000 mg | ORAL_TABLET | Freq: Every day | ORAL | 0 refills | Status: DC | PRN

## 2017-04-22 MED ORDER — POTASSIUM CHLORIDE ER 10 MEQ PO TBCR: 10.0000 meq | EXTENDED_RELEASE_TABLET | Freq: Every day | ORAL | 1 refills | Status: DC

## 2017-04-22 MED ORDER — OMEPRAZOLE 40 MG PO CPDR: 40.0000 mg | DELAYED_RELEASE_CAPSULE | Freq: Every day | ORAL | 3 refills | Status: DC

## 2017-04-22 MED ORDER — FAMOTIDINE 40 MG PO TABS: 40.0000 mg | ORAL_TABLET | Freq: Two times a day (BID) | ORAL | 3 refills | Status: DC

## 2017-04-22 MED ORDER — ALBUTEROL SULFATE HFA 108 (90 BASE) MCG/ACT IN AERS: INHALATION_SPRAY | RESPIRATORY_TRACT | 5 refills | Status: DC

## 2017-04-22 MED ORDER — CARVEDILOL 12.5 MG PO TABS: 12.5000 mg | ORAL_TABLET | Freq: Two times a day (BID) | ORAL | 1 refills | Status: DC

## 2017-04-22 MED ORDER — DILTIAZEM HCL ER COATED BEADS 120 MG PO CP24: 120.0000 mg | ORAL_CAPSULE | Freq: Every morning | ORAL | 1 refills | Status: DC

## 2017-04-22 MED ORDER — FLUTICASONE-SALMETEROL 250-50 MCG/DOSE IN AEPB: INHALATION_SPRAY | RESPIRATORY_TRACT | 5 refills | Status: DC

## 2017-04-22 MED ORDER — ROSUVASTATIN CALCIUM 20 MG PO TABS: ORAL_TABLET | ORAL | 1 refills | Status: DC

## 2017-04-22 MED ORDER — DILTIAZEM HCL ER COATED BEADS 240 MG PO CP24: ORAL_CAPSULE | ORAL | 1 refills | Status: DC

## 2017-04-22 MED ORDER — TRAMADOL HCL 50 MG PO TABS: 50.0000 mg | ORAL_TABLET | Freq: Two times a day (BID) | ORAL | 1 refills | Status: DC | PRN

## 2017-04-22 MED ORDER — PREDNISONE 20 MG PO TABS: ORAL_TABLET | ORAL | 0 refills | Status: DC

## 2017-04-22 MED ORDER — MONTELUKAST SODIUM 10 MG PO TABS: 10.0000 mg | ORAL_TABLET | Freq: Every day | ORAL | 3 refills | Status: DC

## 2017-04-22 MED ORDER — FLUTICASONE PROPIONATE 50 MCG/ACT NA SUSP: NASAL | 3 refills | Status: DC

## 2017-04-22 NOTE — Progress Notes (Signed)
Subjective:    Patient ID: Brooke Wilson, female    DOB: Dec 10, 1955, 62 y.o.   MRN: [id number]  04/22/2017  Chronic Conditions (6 month follow-up) and Medication Refill    HPI This 62 y.o. female presents for six month follow-up of hypertension, hypercholesterolemia, allergic rhinitis, asthma, GERD, insomnia, DDD cervical spine.   [place] Controlled Substance Registry:  Last Temazepam in October 2018.   Last Tramadol in September 2018.  Management changes made at last visit include: -pt refused blood work as recently underwent blood work at [facility]; obtain ROS. -pap smear obtained yet hysterectomy status.  Obtain u/a. -refill of Omeprazole 40mg  daily provided. -advised patient last year that should not be maintained on two benzos; requesting refill of temazepam; will help with wean; provided with temazepam 15mg  for one month; can call for refill and will provide 7.5mg  for one month and then stop. -also advise that cannot be maintained on valium for muscle relaxer if I am to refill medications instead of pain specialist at [facility].  I do not prescribe tid benzos scheduled due to risk of dependence and dementia.  Gets more tired easily.   Air[place] is too long.  Prolonged walking. Working without difficulty. Some exercise; knees giving out.   Cervical spine is problematic.  More frequent pain. No need for knee replacement. More pitting edema. Onset two weeks ago. No change in diet.  Last ortho appointment years ago.   Stress testing WNL; EF 64%. Went to pain clinic one year ago; did not offer any other treatments.  He relocated to [facility].  Appointment 04/28/17 with cardiology.   BP Readings from Last 3 Encounters:  04/28/17 132/76  04/22/17 (!) 142/77  11/13/16 130/68   Wt Readings from Last 3 Encounters:  04/28/17 151 lb 12.8 oz (68.9 kg)  04/22/17 153 lb (69.4 kg)  11/13/16 157 lb (71.2 kg)   Immunization History  Administered  Date(s) Administered  . Influenza,inj,quad, With Preservative 11/16/2014  . Influenza-Unspecified 11/13/2014, 12/17/2015, 12/16/2016  . Td 12/17/2006    Review of Systems  Constitutional: Negative for chills, diaphoresis, fatigue and fever.  Eyes: Negative for visual disturbance.  Respiratory: Negative for cough and shortness of breath.   Cardiovascular: Negative for chest pain, palpitations and leg swelling.  Gastrointestinal: Negative for abdominal pain, constipation, diarrhea, nausea and vomiting.  Endocrine: Negative for cold intolerance, heat intolerance, polydipsia, polyphagia and polyuria.  Musculoskeletal: Positive for arthralgias, gait problem, neck pain and neck stiffness.  Neurological: Negative for dizziness, tremors, seizures, syncope, facial asymmetry, speech difficulty, weakness, light-headedness, numbness and headaches.    Past Medical History:  Diagnosis Date  . Acute maxillary sinusitis 11/12/2015  . Allergy   . Anxiety state 05/17/2011  . AR (allergic rhinitis) 02/17/2012  . Asthma   . BMI 31.0-31.9,adult 03/23/2011  . Cough 03/23/2011  . Dental disease 02/17/2012   [place] 01[place]   . Essential hypertension, benign 03/23/2011  . GERD (gastroesophageal reflux disease) 05/17/2011  . H/O varicella   . History of measles, mumps, or rubella   . Hyperlipemia 03/23/2011  . Hyperlipidemia   . Hypertension   . Hypertensive disorder 09/30/2016  . Insomnia 05/17/2011  . Mid back pain 02/17/2012  . Neck pain 02/17/2012  . Obesity 09/30/2016  . 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 RAD (reactive airway disease) 02/17/2012   Past Surgical History:  Procedure Laterality Date  . TUBAL LIGATION    . VAGINAL HYSTERECTOMY     uterine prolapse; DUB;  ovaries intact.  [facility]   Allergies  Allergen Reactions  . Ace Inhibitors Cough  . Iodinated Diagnostic Agents   . Lisinopril Cough  . Sulfa Antibiotics   . Bactrim Rash  . Flexeril [Cyclobenzaprine Hcl] Nausea Only  .  Latex Itching  . Naprosyn [Naproxen] Nausea And Vomiting    And high doses of NSAIDS   Current Outpatient Medications on File Prior to Visit  Medication Sig Dispense Refill  . aspirin 81 MG tablet Take 81 mg by mouth daily.    . baclofen (LIORESAL) 20 MG tablet Take 1 tablet (20 mg total) by mouth 2 (two) times daily. 180 tablet 1  . latanoprost (XALATAN) 0.005 % ophthalmic solution PLACE 1 DROP INTO BOTH EYES TWICE A DAY 15 mL 2  . zinc sulfate 220 MG capsule Take 1 capsule (220 mg total) by mouth daily. 90 capsule 3   No current facility-administered medications on file prior to visit.    Social History   Socioeconomic History  . Marital status: Married    Spouse name: Not on file  . Number of children: Not on file  . Years of education: Not on file  . Highest education level: Not on file  Occupational History  . Not on file  Social Needs  . Financial resource strain: Not on file  . Food insecurity:    Worry: Not on file    Inability: Not on file  . Transportation needs:    Medical: Not on file    Non-medical: Not on file  Tobacco Use  . Smoking status: Never Smoker  . Smokeless tobacco: Never Used  Substance and Sexual Activity  . Alcohol use: No    Alcohol/week: 0.0 oz  . Drug use: No  . Sexual activity: Yes    Birth control/protection: Post-menopausal, Surgical  Lifestyle  . Physical activity:    Days per week: Not on file    Minutes per session: Not on file  . Stress: Not on file  Relationships  . Social connections:    Talks on phone: Not on file    Gets together: Not on file    Attends religious service: Not on file    Active member of club or organization: Not on file    Attends meetings of clubs or organizations: Not on file    Relationship status: Not on file  . Intimate partner violence:    Fear of current or ex partner: Not on file    Emotionally abused: Not on file    Physically abused: Not on file    Forced sexual activity: Not on file  Other  Topics Concern  . Not on file  Social History Narrative   Marital status:  Married x 38 years; from [place]; moved to [place] in 1987.      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      Advanced Directives: DNR/DNI; HCPOA: Sati Tempesta-Enicole   Family History  Problem Relation Age of Onset  . Hypertension Mother   . Stroke Mother   . Hypertension Brother   . Stroke Brother   . Cancer Maternal Grandmother  ovarian  . Hypertension Sister   . Stroke Sister 30       CVA       Objective:    BP (!) 142/77   Pulse 83   Temp 98 F (36.7 C) (Oral)   Resp 16   Ht 4' 11.84" (1.52 m)   Wt 153 lb (69.4 kg)   SpO2 97%   BMI 30.04 kg/m  Physical Exam  Constitutional: She is oriented to person, place, and time. She appears well-developed and well-nourished. No distress.  HENT:  Head: Normocephalic and atraumatic.  Right Ear: External ear normal.  Left Ear: External ear normal.  Nose: Nose normal.  Mouth/Throat: Oropharynx is clear and moist.  Eyes: Conjunctivae and EOM are normal. Pupils are equal, round, and reactive to light.  Neck: Normal range of motion. Neck supple. Carotid bruit is not present. No thyromegaly present.  Cardiovascular: Normal rate, regular rhythm, normal heart sounds and intact distal pulses. Exam reveals no gallop and no friction rub.  No murmur heard. Pulmonary/Chest: Effort normal and breath sounds normal. She has no wheezes. She has no rales.  Abdominal: Soft. Bowel sounds are normal. She exhibits no distension and no mass. There is no tenderness. There is no rebound and no guarding.  Lymphadenopathy:    She has no cervical adenopathy.  Neurological: She is alert and oriented to person, place, and time. No cranial nerve deficit.  Skin:  Skin is warm and dry. No rash noted. She is not diaphoretic. No erythema. No pallor.  Psychiatric: She has a normal mood and affect. Her behavior is normal.   No results found. Depression screen [facility] 2/9 04/22/2017 11/13/2016 04/15/2016 11/12/2015 06/02/2015  Decreased Interest 0 0 0 0 0  Down, Depressed, Hopeless 0 0 0 0 0  PHQ - 2 Score 0 0 0 0 0   Fall Risk  04/22/2017 11/13/2016 04/15/2016 11/12/2015 02/17/2015  Falls in the past year? No No No No No        Assessment & Plan:   1. Essential hypertension, benign   2. Pure hypercholesterolemia   3. Screening for HIV (human immunodeficiency virus)   4. Need for hepatitis C screening test   5. Sore throat   6. Gastroesophageal reflux disease with esophagitis   7. Chronic neck pain   8. Acute recurrent maxillary sinusitis   9. Psychophysiological insomnia     Acute sinusitis: recurrent; treat with Augmentin and Prednisone therapies. DDD cervical spine: persistent; rx for Tramadol provided for PRN use. OA knee: persistent; no interest in knee replacement; highly recommend exercise with stationary bike and/or water aerobics.   HTN: controlled; obtain labs; refills provided. Hypercholesterolemia: controlled; obtain labs; refills provided. Insomnia: refill of Valium provided yet advised on PRN use only as not indicated for daily or long term use due to risk of dementia.   Orders Placed This Encounter  Procedures  . Culture, Group A Strep    Order Specific Question:   Source    Answer:   oropharynx  . CBC with Differential/Platelet  . Comprehensive metabolic panel    Order Specific Question:   Has the patient fasted?    Answer:   No  . Lipid panel    Order Specific Question:   Has the patient fasted?    Answer:   No  . TSH  . Hepatitis C antibody  . HIV antibody  . POCT rapid strep A   Meds ordered this encounter  Medications  . DISCONTD: potassium chloride (KLOR-CON  10) 10 MEQ tablet    Sig: Take 1 tablet (10 mEq total) by mouth  daily. Requesting orange or yellow Klor-Con    Dispense:  270 tablet    Refill:  1  . DISCONTD: rosuvastatin (CRESTOR) 20 MG tablet    Sig: 1 tablet daily    Dispense:  90 tablet    Refill:  1  . albuterol (PROVENTIL HFA) 108 (90 Base) MCG/ACT inhaler    Sig: INHALE 2 PUFFS INTO THE LUNGS EVERY 6 HOURS AS NEEDED FOR WHEEZING OR SHORTNESS OF BREATH    Dispense:  6.7 each    Refill:  5  . DISCONTD: carvedilol (COREG) 12.5 MG tablet    Sig: Take 1 tablet (12.5 mg total) by mouth 2 (two) times daily.    Dispense:  180 tablet    Refill:  1  . DISCONTD: diltiazem (CARDIZEM CD) 120 MG 24 hr capsule    Sig: Take 1 capsule (120 mg total) by mouth every morning.    Dispense:  90 capsule    Refill:  1  . DISCONTD: diltiazem (CARDIZEM CD) 240 MG 24 hr capsule    Sig: 1 CAPSULE IN EVENING    Dispense:  90 capsule    Refill:  1  . famotidine (PEPCID) 40 MG tablet    Sig: Take 1 tablet (40 mg total) by mouth 2 (two) times daily.    Dispense:  180 tablet    Refill:  3  . fluticasone (FLONASE) 50 MCG/ACT nasal spray    Sig: PLACE 2 SPRAYS IN THE NOSTRIL EVERY DAY    Dispense:  48 g    Refill:  3  . Fluticasone-Salmeterol (ADVAIR DISKUS) 250-50 MCG/DOSE AEPB    Sig: INHALE 1 PUFF INTO THE LUNGS EVERY 12 (TWELVE) HOURS.    Dispense:  60 each    Refill:  5  . montelukast (SINGULAIR) 10 MG tablet    Sig: Take 1 tablet (10 mg total) by mouth at bedtime.    Dispense:  90 tablet    Refill:  3  . omeprazole (PRILOSEC) 40 MG capsule    Sig: Take 1 capsule (40 mg total) by mouth daily.    Dispense:  90 capsule    Refill:  3  . DISCONTD: rosuvastatin (CRESTOR) 20 MG tablet    Sig: 1 tablet daily    Dispense:  90 tablet    Refill:  1  . traMADol (ULTRAM) 50 MG tablet    Sig: Take 1 tablet (50 mg total) by mouth every 12 (twelve) hours as needed.    Dispense:  60 tablet    Refill:  1  . diazepam (VALIUM) 10 MG tablet    Sig: Take 1 tablet (10 mg total) by mouth daily as needed for anxiety.     Dispense:  30 tablet    Refill:  0  . amoxicillin-clavulanate (AUGMENTIN) 875-125 MG tablet    Sig: Take 1 tablet by mouth 2 (two) times daily.    Dispense:  20 tablet    Refill:  0  . predniSONE (DELTASONE) 20 MG tablet    Sig: Take 3 PO QAM x 1 day, 2 PO QAM x 5 days, 1 PO QAM x 5 days    Dispense:  18 tablet    Refill:  0    Return in about 6 months (around 10/23/2017) for complete physical examiniation.   Deontre Allsup Paulita Fujita, M.D. Primary Care at [facility] previously [facility] [address] , [place]  [address] [phone] phone [phone] fax  

## 2017-04-22 NOTE — Patient Instructions (Addendum)
   IF you received an x-ray today, you will receive an invoice from [facility]. Please contact [facility] at [phone] with questions or concerns regarding your invoice.   IF you received labwork today, you will receive an invoice from [facility]. Please contact [facility] at [phone] with questions or concerns regarding your invoice.   Our billing staff will not be able to assist you with questions regarding bills from these companies.  You will be contacted with the lab results as soon as they are available. The fastest way to get your results is to activate your My Chart account. Instructions are located on the last page of this paperwork. If you have not heard from us regarding the results in 2 weeks, please contact this office.      Managing Your Hypertension Hypertension is commonly called high blood pressure. This is when the force of your blood pressing against the walls of your arteries is too strong. Arteries are blood vessels that carry blood from your heart throughout your body. Hypertension forces the heart to work harder to pump blood, and may cause the arteries to become narrow or stiff. Having untreated or uncontrolled hypertension can cause heart attack, stroke, kidney disease, and other problems. What are blood pressure readings? A blood pressure reading consists of a higher number over a lower number. Ideally, your blood pressure should be below 120/80. The first ("top") number is called the systolic pressure. It is a measure of the pressure in your arteries as your heart beats. The second ("bottom") number is called the diastolic pressure. It is a measure of the pressure in your arteries as the heart relaxes. What does my blood pressure reading mean? Blood pressure is classified into four stages. Based on your blood pressure reading, your health care provider may use the following stages to determine what type of treatment you need, if any. Systolic  pressure and diastolic pressure are measured in a unit called mm Hg. Normal  Systolic pressure: below 120.  Diastolic pressure: below 80. Elevated  Systolic pressure: 120-129.  Diastolic pressure: below 80. Hypertension stage 1  Systolic pressure: 130-139.  Diastolic pressure: 80-89. Hypertension stage 2  Systolic pressure: 140 or above.  Diastolic pressure: 90 or above. What health risks are associated with hypertension? Managing your hypertension is an important responsibility. Uncontrolled hypertension can lead to:  A heart attack.  A stroke.  A weakened blood vessel (aneurysm).  Heart failure.  Kidney damage.  Eye damage.  Metabolic syndrome.  Memory and concentration problems.  What changes can I make to manage my hypertension? Hypertension can be managed by making lifestyle changes and possibly by taking medicines. Your health care provider will help you make a plan to bring your blood pressure within a normal range. Eating and drinking  Eat a diet that is high in fiber and potassium, and low in salt (sodium), added sugar, and fat. An example eating plan is called the DASH (Dietary Approaches to Stop Hypertension) diet. To eat this way: ? Eat plenty of fresh fruits and vegetables. Try to fill half of your plate at each meal with fruits and vegetables. ? Eat whole grains, such as whole wheat pasta, brown rice, or whole grain bread. Fill about one quarter of your plate with whole grains. ? Eat low-fat diary products. ? Avoid fatty cuts of meat, processed or cured meats, and poultry with skin. Fill about one quarter of your plate with lean proteins such as fish, chicken without skin, beans, eggs,   and tofu. ? Avoid premade and processed foods. These tend to be higher in sodium, added sugar, and fat.  Reduce your daily sodium intake. Most people with hypertension should eat less than 1,500 mg of sodium a day.  Limit alcohol intake to no more than 1 drink a day  for nonpregnant women and 2 drinks a day for men. One drink equals 12 oz of beer, 5 oz of wine, or 1 oz of hard liquor. Lifestyle  Work with your health care provider to maintain a healthy body weight, or to lose weight. Ask what an ideal weight is for you.  Get at least 30 minutes of exercise that causes your heart to beat faster (aerobic exercise) most days of the week. Activities may include walking, swimming, or biking.  Include exercise to strengthen your muscles (resistance exercise), such as weight lifting, as part of your weekly exercise routine. Try to do these types of exercises for 30 minutes at least 3 days a week.  Do not use any products that contain nicotine or tobacco, such as cigarettes and e-cigarettes. If you need help quitting, ask your health care provider.  Control any long-term (chronic) conditions you have, such as high cholesterol or diabetes. Monitoring  Monitor your blood pressure at home as told by your health care provider. Your personal target blood pressure may vary depending on your medical conditions, your age, and other factors.  Have your blood pressure checked regularly, as often as told by your health care provider. Working with your health care provider  Review all the medicines you take with your health care provider because there may be side effects or interactions.  Talk with your health care provider about your diet, exercise habits, and other lifestyle factors that may be contributing to hypertension.  Visit your health care provider regularly. Your health care provider can help you create and adjust your plan for managing hypertension. Will I need medicine to control my blood pressure? Your health care provider may prescribe medicine if lifestyle changes are not enough to get your blood pressure under control, and if:  Your systolic blood pressure is 130 or higher.  Your diastolic blood pressure is 80 or higher.  Take medicines only as told  by your health care provider. Follow the directions carefully. Blood pressure medicines must be taken as prescribed. The medicine does not work as well when you skip doses. Skipping doses also puts you at risk for problems. Contact a health care provider if:  You think you are having a reaction to medicines you have taken.  You have repeated (recurrent) headaches.  You feel dizzy.  You have swelling in your ankles.  You have trouble with your vision. Get help right away if:  You develop a severe headache or confusion.  You have unusual weakness or numbness, or you feel faint.  You have severe pain in your chest or abdomen.  You vomit repeatedly.  You have trouble breathing. Summary  Hypertension is when the force of blood pumping through your arteries is too strong. If this condition is not controlled, it may put you at risk for serious complications.  Your personal target blood pressure may vary depending on your medical conditions, your age, and other factors. For most people, a normal blood pressure is less than 120/80.  Hypertension is managed by lifestyle changes, medicines, or both. Lifestyle changes include weight loss, eating a healthy, low-sodium diet, exercising more, and limiting alcohol. This information is not intended to replace advice   given to you by your health care provider. Make sure you discuss any questions you have with your health care provider. Document Released: 10/27/2011 Document Revised: 12/31/2015 Document Reviewed: 12/31/2015 Elsevier Interactive Patient Education  2018 Elsevier Inc.  

## 2017-04-23 LAB — HIV ANTIBODY (ROUTINE TESTING W REFLEX): HIV SCREEN 4TH GENERATION: NONREACTIVE

## 2017-04-23 LAB — COMPREHENSIVE METABOLIC PANEL
A/G RATIO: 1.7 (ref 1.2–2.2)
ALBUMIN: 4.4 g/dL (ref 3.6–4.8)
ALT: 13 IU/L (ref 0–32)
AST: 13 IU/L (ref 0–40)
Alkaline Phosphatase: 111 IU/L (ref 39–117)
BUN / CREAT RATIO: 46 — AB (ref 12–28)
BUN: 32 mg/dL — ABNORMAL HIGH (ref 8–27)
Bilirubin Total: <0.2 mg/dL (ref 0.0–1.2)
CALCIUM: 9 mg/dL (ref 8.7–10.3)
CO2: 17 mmol/L — AB (ref 20–29)
CREATININE: 0.7 mg/dL (ref 0.57–1.00)
Chloride: 108 mmol/L — ABNORMAL HIGH (ref 96–106)
GFR, EST AFRICAN AMERICAN: 108 mL/min/{1.73_m2} (ref >59)
GFR, EST NON AFRICAN AMERICAN: 94 mL/min/{1.73_m2} (ref >59)
GLOBULIN, TOTAL: 2.6 g/dL (ref 1.5–4.5)
Glucose: 97 mg/dL (ref 65–99)
POTASSIUM: 4.1 mmol/L (ref 3.5–5.2)
SODIUM: 141 mmol/L (ref 134–144)
TOTAL PROTEIN: 7 g/dL (ref 6.0–8.5)

## 2017-04-23 LAB — CBC WITH DIFFERENTIAL/PLATELET
BASOS: 0 %
Basophils Absolute: 0 10*3/uL (ref 0.0–0.2)
EOS (ABSOLUTE): 0 10*3/uL (ref 0.0–0.4)
EOS: 0 %
HEMATOCRIT: 38.1 % (ref 34.0–46.6)
HEMOGLOBIN: 12.4 g/dL (ref 11.1–15.9)
IMMATURE GRANULOCYTES: 1 %
Immature Grans (Abs): 0.1 10*3/uL (ref 0.0–0.1)
Lymphocytes Absolute: 1.8 10*3/uL (ref 0.7–3.1)
Lymphs: 18 %
MCH: 26.5 pg — ABNORMAL LOW (ref 26.6–33.0)
MCHC: 32.5 g/dL (ref 31.5–35.7)
MCV: 81 fL (ref 79–97)
MONOCYTES: 8 %
MONOS ABS: 0.8 10*3/uL (ref 0.1–0.9)
NEUTROS PCT: 73 %
Neutrophils Absolute: 7.4 10*3/uL — ABNORMAL HIGH (ref 1.4–7.0)
Platelets: 299 10*3/uL (ref 150–379)
RBC: 4.68 x10E6/uL (ref 3.77–5.28)
RDW: 14.6 % (ref 12.3–15.4)
WBC: 10.1 10*3/uL (ref 3.4–10.8)

## 2017-04-23 LAB — TSH: TSH: 0.451 u[IU]/mL (ref 0.450–4.500)

## 2017-04-23 LAB — LIPID PANEL
CHOL/HDL RATIO: 2.1 ratio (ref 0.0–4.4)
Cholesterol, Total: 157 mg/dL (ref 100–199)
HDL: 76 mg/dL (ref >39)
LDL Calculated: 68 mg/dL (ref 0–99)
Triglycerides: 65 mg/dL (ref 0–149)
VLDL Cholesterol Cal: 13 mg/dL (ref 5–40)

## 2017-04-23 LAB — HEPATITIS C ANTIBODY

## 2017-04-24 LAB — CULTURE, GROUP A STREP: Strep A Culture: NEGATIVE

## 2017-04-26 ENCOUNTER — Other Ambulatory Visit: Payer: Self-pay | Admitting: Family Medicine

## 2017-04-26 NOTE — Telephone Encounter (Signed)
Copied from CRM [phone]. Topic: Quick Communication - Rx Refill/Question >> Apr 26, 2017 12:50 PM Maia PettiesOrtiz, Kristie S wrote: Medication: aMILoride (MIDAMOR) 5 MG tablet - pt takes 1/day - previously ordered by a different doctor - pt states pharmacy told her we have not responded to requests (Dr. Katrinka BlazingSmith) Has the patient contacted their pharmacy? Yes.   Preferred Pharmacy (with phone number or street name): [id number], [place] - [address] [phone] (Phone) [phone] (Fax)    Agent: Please be advised that RX refills may take up to 3 business days. We ask that you follow-up with your pharmacy.

## 2017-04-26 NOTE — Telephone Encounter (Signed)
Phone call to patient. Patient unavailable at time of call.   When patient calls back, please ask who prescribed this medication for her and ask she follow up with this provider. If it was her previous primary care doctor, please note this and request will be forwarded to Dr. Katrinka BlazingSmith.

## 2017-04-27 NOTE — Progress Notes (Signed)
Cardiology Office Note:    Date:  04/28/2017   ID:  Brooke Wilson, DOB Jun 19, 1955, MRN [id number]  PCP:  Ethelda Chick, MD  Cardiologist:  Norman Herrlich, MD    Referring MD: [facility], [facility]    ASSESSMENT:    1. Essential hypertension, benign   2. Pure hypercholesterolemia    PLAN:    In order of problems listed above:  1. Improved blood pressure consistently at target and continue her multidrug regimen including distal diuretic beta-blocker calcium channel blocker hydralazine 2. Stable continue her statin lipids are ideal   Next appointment: 6 months   Medication Adjustments/Labs and Tests Ordered: Current medicines are reviewed at length with the patient today.  Concerns regarding medicines are outlined above.  No orders of the defined types were placed in this encounter.  No orders of the defined types were placed in this encounter.   Chief Complaint  Patient presents with  . Follow-up    6 month flup appt     History of Present Illness:    Brooke Wilson is a 62 y.o. female with a hx of hypertension, hyperlipidemia and a normal MPI with chest pain  last seen 6 months ago. Compliance with diet, lifestyle and medications: No, she does not sodium restrict She tolerates a multidrug regimen for hypertension blood pressure target complains only of a sense of fatigue which she works very vigorously and full-time.  No chest pain shortness of breath palpitation or syncope.  She has mild edema with calcium channel blocker and uses support hose. Past Medical History:  Diagnosis Date  . Acute maxillary sinusitis 11/12/2015  . Allergy   . Anxiety state 05/17/2011  . AR (allergic rhinitis) 02/17/2012  . Asthma   . BMI 31.0-31.9,adult 03/23/2011  . Cough 03/23/2011  . Dental disease 02/17/2012   [place] 01/2012   . Essential hypertension, benign 03/23/2011  . GERD (gastroesophageal reflux disease) 05/17/2011  . H/O varicella   . History of  measles, mumps, or rubella   . Hyperlipemia 03/23/2011  . Hyperlipidemia   . Hypertension   . Hypertensive disorder 09/30/2016  . Insomnia 05/17/2011  . Mid back pain 02/17/2012  . Neck pain 02/17/2012  . Obesity 09/30/2016  . 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 RAD (reactive airway disease) 02/17/2012    Past Surgical History:  Procedure Laterality Date  . TUBAL LIGATION    . VAGINAL HYSTERECTOMY     uterine prolapse; DUB; ovaries intact.  [facility]    Current Medications: Current Meds  Medication Sig  . albuterol (PROVENTIL HFA) 108 (90 Base) MCG/ACT inhaler INHALE 2 PUFFS INTO THE LUNGS EVERY 6 HOURS AS NEEDED FOR WHEEZING OR SHORTNESS OF BREATH  . aMILoride (MIDAMOR) 5 MG tablet Take 5 mg by mouth daily.  Marland Kitchen amoxicillin-clavulanate (AUGMENTIN) 875-125 MG tablet Take 1 tablet by mouth 2 (two) times daily.  Marland Kitchen aspirin 81 MG tablet Take 81 mg by mouth daily.  . baclofen (LIORESAL) 20 MG tablet Take 1 tablet (20 mg total) by mouth 2 (two) times daily.  . carvedilol (COREG) 12.5 MG tablet Take 1 tablet (12.5 mg total) by mouth 2 (two) times daily.  . diazepam (VALIUM) 10 MG tablet Take 1 tablet (10 mg total) by mouth daily as needed for anxiety.  Marland Kitchen diltiazem (CARDIZEM CD) 120 MG 24 hr capsule Take 1 capsule (120 mg total) by mouth every morning.  . diltiazem (CARDIZEM CD) 240 MG 24 hr capsule 1 CAPSULE IN EVENING  . famotidine (  PEPCID) 40 MG tablet Take 1 tablet (40 mg total) by mouth 2 (two) times daily.  . fluticasone (FLONASE) 50 MCG/ACT nasal spray PLACE 2 SPRAYS IN THE NOSTRIL EVERY DAY  . Fluticasone-Salmeterol (ADVAIR DISKUS) 250-50 MCG/DOSE AEPB INHALE 1 PUFF INTO THE LUNGS EVERY 12 (TWELVE) HOURS.  . hydrALAZINE (APRESOLINE) 25 MG tablet Take 1 tablet (25 mg total) by mouth 3 (three) times daily.  Marland Kitchen ibuprofen (ADVIL,MOTRIN) 800 MG tablet Take 1 tablet (800 mg total) by mouth 3 (three) times daily.  Marland Kitchen latanoprost (XALATAN) 0.005 % ophthalmic solution PLACE 1 DROP INTO BOTH EYES TWICE A  DAY  . montelukast (SINGULAIR) 10 MG tablet Take 1 tablet (10 mg total) by mouth at bedtime.  Marland Kitchen omeprazole (PRILOSEC) 40 MG capsule Take 1 capsule (40 mg total) by mouth daily.  . potassium chloride (KLOR-CON 10) 10 MEQ tablet Take 1 tablet (10 mEq total) by mouth daily. Requesting orange or yellow Klor-Con  . predniSONE (DELTASONE) 20 MG tablet Take 3 PO QAM x 1 day, 2 PO QAM x 5 days, 1 PO QAM x 5 days  . rosuvastatin (CRESTOR) 20 MG tablet 1 tablet daily  . traMADol (ULTRAM) 50 MG tablet Take 1 tablet (50 mg total) by mouth every 12 (twelve) hours as needed.  . zinc sulfate 220 MG capsule Take 1 capsule (220 mg total) by mouth daily.     Allergies:   Ace inhibitors; Iodinated diagnostic agents; Lisinopril; Sulfa antibiotics; Bactrim; Flexeril [cyclobenzaprine hcl]; Latex; and Naprosyn [naproxen]   Social History   Socioeconomic History  . Marital status: Married    Spouse name: None  . Number of children: None  . Years of education: None  . Highest education level: None  Social Needs  . Financial resource strain: None  . Food insecurity - worry: None  . Food insecurity - inability: None  . Transportation needs - medical: None  . Transportation needs - non-medical: None  Occupational History  . None  Tobacco Use  . Smoking status: Never Smoker  . Smokeless tobacco: Never Used  Substance and Sexual Activity  . Alcohol use: No    Alcohol/week: 0.0 oz  . Drug use: No  . Sexual activity: Yes    Birth control/protection: Post-menopausal, Surgical  Other Topics Concern  . None  Social History Narrative   Marital status:  Married x 38 years; from the [place]; moved to [place] in 1987.      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      Advanced Directives: DNR/DNI;  HCPOA: Sati Shishido-Enicole     Family History: The patient's family history includes Cancer in her maternal grandmother; Hypertension in her brother, mother, and sister; Stroke in her brother and mother; Stroke (age of onset: 11) in her sister. ROS:   Please see the history of present illness.    All other systems reviewed and are negative.  EKGs/Labs/Other Studies Reviewed:    The following studies were reviewed today:   Recent Labs: 04/22/2017: ALT 13; BUN 32; Creatinine, Ser 0.70; Hemoglobin 12.4; Platelets 299; Potassium 4.1; Sodium 141; TSH 0.451  Recent Lipid Panel    Component Value Date/Time  CHOL 157 04/22/2017 1734   TRIG 65 04/22/2017 1734   HDL 76 04/22/2017 1734   CHOLHDL 2.1 04/22/2017 1734   CHOLHDL 2.9 02/17/2015 1956   VLDL 35 (H) 02/17/2015 1956   LDLCALC 68 04/22/2017 1734    Physical Exam:    VS:  BP 132/76 (BP Location: Left Arm, Patient Position: Sitting, Cuff Size: Normal)   Pulse 69   Ht 4\' 11"  (1.499 m)   Wt 151 lb 12.8 oz (68.9 kg)   SpO2 98%   BMI 30.66 kg/m     Wt Readings from Last 3 Encounters:  04/28/17 151 lb 12.8 oz (68.9 kg)  04/22/17 153 lb (69.4 kg)  11/13/16 157 lb (71.2 kg)     GEN:  Well nourished, well developed in no acute distress HEENT: Normal NECK: No JVD; No carotid bruits LYMPHATICS: No lymphadenopathy CARDIAC: RRR, no murmurs, rubs, gallops RESPIRATORY:  Clear to auscultation without rales, wheezing or rhonchi  ABDOMEN: Soft, non-tender, non-distended MUSCULOSKELETAL:  No edema; No deformity  SKIN: Warm and dry NEUROLOGIC:  Alert and oriented x 3 PSYCHIATRIC:  Normal affect    Signed, Norman HerrlichBrian Munley, MD  04/28/2017 4:28 PM    [facility]

## 2017-04-28 ENCOUNTER — Ambulatory Visit: Payer: Federal, State, Local not specified - PPO | Admitting: Cardiology

## 2017-04-28 ENCOUNTER — Ambulatory Visit (INDEPENDENT_AMBULATORY_CARE_PROVIDER_SITE_OTHER): Payer: Federal, State, Local not specified - PPO | Admitting: Cardiology

## 2017-04-28 ENCOUNTER — Encounter: Payer: Self-pay | Admitting: Cardiology

## 2017-04-28 VITALS — BP 132/76 | HR 69 | Ht 59.0 in | Wt 151.8 lb

## 2017-04-28 DIAGNOSIS — I10 Essential (primary) hypertension: Secondary | ICD-10-CM | POA: Diagnosis not present

## 2017-04-28 DIAGNOSIS — E7800 Pure hypercholesterolemia, unspecified: Secondary | ICD-10-CM | POA: Diagnosis not present

## 2017-04-28 MED ORDER — POTASSIUM CHLORIDE ER 10 MEQ PO TBCR: 10.0000 meq | EXTENDED_RELEASE_TABLET | Freq: Every day | ORAL | 3 refills | Status: DC

## 2017-04-28 MED ORDER — ROSUVASTATIN CALCIUM 20 MG PO TABS: ORAL_TABLET | ORAL | 3 refills | Status: DC

## 2017-04-28 MED ORDER — DILTIAZEM HCL ER COATED BEADS 240 MG PO CP24: ORAL_CAPSULE | ORAL | 3 refills | Status: DC

## 2017-04-28 MED ORDER — AMILORIDE HCL 5 MG PO TABS: 5.0000 mg | ORAL_TABLET | Freq: Every day | ORAL | 3 refills | Status: DC

## 2017-04-28 MED ORDER — DILTIAZEM HCL ER COATED BEADS 120 MG PO CP24: 120.0000 mg | ORAL_CAPSULE | Freq: Every morning | ORAL | 3 refills | Status: DC

## 2017-04-28 MED ORDER — CARVEDILOL 12.5 MG PO TABS: 12.5000 mg | ORAL_TABLET | Freq: Two times a day (BID) | ORAL | 3 refills | Status: DC

## 2017-04-28 MED ORDER — HYDRALAZINE HCL 25 MG PO TABS: 25.0000 mg | ORAL_TABLET | Freq: Three times a day (TID) | ORAL | 3 refills | Status: DC

## 2017-04-28 NOTE — Telephone Encounter (Addendum)
Patient returned call to say that Dr. Merla Richesoolittle prescribed this medication last.   Last OV: 04/22/17 PCP: Nilda SimmerSmith, Kristi Pharmacy: [id number] - [place], [place] - [address] [phone] (Phone) [phone] (Fax)

## 2017-04-28 NOTE — Telephone Encounter (Signed)
She said that it was Dr Merla Richesoolittle

## 2017-04-28 NOTE — Patient Instructions (Addendum)
Medication Instructions:  Your physician recommends that you continue on your current medications as directed. Please refer to the Current Medication list given to you today.  Labwork: None  Testing/Procedures: None  Follow-Up: Your physician wants you to follow-up in: 6 months. You will receive a reminder letter in the mail two months in advance. If you don't receive a letter, please call our office to schedule the follow-up appointment.  Any Other Special Instructions Will Be Listed Below (If Applicable).     If you need a refill on your cardiac medications before your next appointment, please call your pharmacy.    Locations to Purchase Compression Stockings:  [facility] [address] [place], [place] [address] [phone] [email or web address] *Mon-Fri 9am-4:30pm* *Closed on Holidays*  Putting on Compression Stockings Turn the stocking inside-out, then fit it over your toes and heel.  Roll the stocking up your leg.  Once stockings are on, make sure the top of the stocking is about two fingers' width below the crease of the knee (or the groin if you wear thigh-high stockings).  Use equipment, such as a stocking donner, or wear rubber gloves to make it easier to put on compression stockings.   Elastic compression stockings are prescribed to treat many vein problems. Wearing them may be the most important thing you do to manage your symptoms. The stockings fit tightly aroundyour ankle, gradually reducing in pressure as they go up your legs. This helps keep blood flowing toyour heart. As a result, swelling is reduced. Your doctor will prescribe stockings at a safe pressure for you. He or she will also tell you how often to wear and remove the stockings. Follow these instructions closely.Also, do not buy or wear compression stockings without first seeing your doctor. Tips for Wear and Care To wear stockings safely and to get the  most benefit:  Wear the length prescribed by your doctor.  Pull them to the designated height and no farther. Don't let them bunch at the top. This can restrict blood flow and increase swelling.  Wear the stockingsfor the amount of time your doctor recommends. Replace them when they start to feel loose. This will likely be every 3 to 6 months.  Remove them as your doctor directs. When removed, wash your legs. Then check your legs and feet for sores. Call your doctor if you find a sore. Don't put the stockings back on unless your doctor directs.  Wash the stockings as instructed. They may need to be handwashed.

## 2017-04-28 NOTE — Addendum Note (Signed)
Addended by: Wilford CornerOVINGTON, Kiffany Schelling W on: 04/28/2017 12:40 PM   Modules accepted: Orders

## 2017-05-09 ENCOUNTER — Other Ambulatory Visit: Payer: Self-pay | Admitting: Family Medicine

## 2017-05-10 NOTE — Telephone Encounter (Signed)
Patient had an office visit 3/18.    Last time filled 1 year ago.

## 2017-05-13 ENCOUNTER — Telehealth: Payer: Self-pay | Admitting: Family Medicine

## 2017-05-13 NOTE — Telephone Encounter (Signed)
Copied from CRM [id number]. Topic: Quick Communication - Other Results >> May 13, 2017  3:43 PM Emmette Katt, Tresa EndoKelly, [place] wrote: Patient is calling because she would like a copy of her chest x-ray. Stated that she needs it for work. If someone about this at [phone]

## 2017-05-16 NOTE — Telephone Encounter (Signed)
NO VOICEMAIL TO LEAVE MESSAGE  Please inform patient  Copy of the xray will be ready for pick up on 05/17/2017. PATIENT WILL NEED TO SIGN A RELEASE TO PICK UP.

## 2017-05-26 DIAGNOSIS — I10 Essential (primary) hypertension: Secondary | ICD-10-CM | POA: Diagnosis not present

## 2017-05-26 DIAGNOSIS — E876 Hypokalemia: Secondary | ICD-10-CM | POA: Diagnosis not present

## 2017-05-26 DIAGNOSIS — E669 Obesity, unspecified: Secondary | ICD-10-CM | POA: Diagnosis not present

## 2017-07-11 ENCOUNTER — Encounter: Payer: Self-pay | Admitting: Family Medicine

## 2017-07-14 ENCOUNTER — Telehealth: Payer: Self-pay | Admitting: Family Medicine

## 2017-07-14 NOTE — Telephone Encounter (Signed)
I tried calling Brooke Wilson in regards to cancelling/rescheduling her appt with Dr. Katrinka Blazing on 11/21/2017 due to provider leaving. Neither telephone number is working and there is not a dpr on file for 2019.

## 2017-07-22 ENCOUNTER — Other Ambulatory Visit: Payer: Self-pay | Admitting: Family Medicine

## 2017-09-01 ENCOUNTER — Other Ambulatory Visit: Payer: Self-pay | Admitting: Family Medicine

## 2017-09-16 ENCOUNTER — Telehealth: Payer: Self-pay | Admitting: Family Medicine

## 2017-09-20 ENCOUNTER — Other Ambulatory Visit: Payer: Self-pay | Admitting: Family Medicine

## 2017-09-21 ENCOUNTER — Ambulatory Visit (INDEPENDENT_AMBULATORY_CARE_PROVIDER_SITE_OTHER): Payer: Federal, State, Local not specified - PPO | Admitting: Family Medicine

## 2017-09-21 ENCOUNTER — Other Ambulatory Visit: Payer: Self-pay

## 2017-09-21 ENCOUNTER — Encounter: Payer: Self-pay | Admitting: Family Medicine

## 2017-09-21 VITALS — BP 120/68 | HR 76 | Temp 98.6°F | Ht 60.0 in | Wt 150.6 lb

## 2017-09-21 DIAGNOSIS — J3089 Other allergic rhinitis: Secondary | ICD-10-CM

## 2017-09-21 DIAGNOSIS — M542 Cervicalgia: Secondary | ICD-10-CM

## 2017-09-21 DIAGNOSIS — J4520 Mild intermittent asthma, uncomplicated: Secondary | ICD-10-CM | POA: Diagnosis not present

## 2017-09-21 DIAGNOSIS — E785 Hyperlipidemia, unspecified: Secondary | ICD-10-CM | POA: Diagnosis not present

## 2017-09-21 DIAGNOSIS — I10 Essential (primary) hypertension: Secondary | ICD-10-CM

## 2017-09-21 DIAGNOSIS — Z5181 Encounter for therapeutic drug level monitoring: Secondary | ICD-10-CM

## 2017-09-21 DIAGNOSIS — K219 Gastro-esophageal reflux disease without esophagitis: Secondary | ICD-10-CM

## 2017-09-21 DIAGNOSIS — G8929 Other chronic pain: Secondary | ICD-10-CM

## 2017-09-21 HISTORY — DX: Encounter for therapeutic drug level monitoring: Z51.81

## 2017-09-21 MED ORDER — MONTELUKAST SODIUM 10 MG PO TABS: 10.0000 mg | ORAL_TABLET | Freq: Every day | ORAL | 3 refills | Status: DC

## 2017-09-21 MED ORDER — DILTIAZEM HCL ER COATED BEADS 240 MG PO CP24: ORAL_CAPSULE | ORAL | 3 refills | Status: DC

## 2017-09-21 MED ORDER — FAMOTIDINE 40 MG PO TABS: 40.0000 mg | ORAL_TABLET | Freq: Two times a day (BID) | ORAL | 3 refills | Status: DC

## 2017-09-21 MED ORDER — ROSUVASTATIN CALCIUM 20 MG PO TABS: ORAL_TABLET | ORAL | 3 refills | Status: DC

## 2017-09-21 MED ORDER — ALBUTEROL SULFATE HFA 108 (90 BASE) MCG/ACT IN AERS: INHALATION_SPRAY | RESPIRATORY_TRACT | 5 refills | Status: DC

## 2017-09-21 MED ORDER — POTASSIUM CHLORIDE ER 10 MEQ PO TBCR: 30.0000 meq | EXTENDED_RELEASE_TABLET | Freq: Every day | ORAL | 3 refills | Status: DC

## 2017-09-21 MED ORDER — IBUPROFEN 800 MG PO TABS: 800.0000 mg | ORAL_TABLET | Freq: Two times a day (BID) | ORAL | 0 refills | Status: DC | PRN

## 2017-09-21 MED ORDER — CARVEDILOL 12.5 MG PO TABS: 12.5000 mg | ORAL_TABLET | Freq: Two times a day (BID) | ORAL | 3 refills | Status: DC

## 2017-09-21 MED ORDER — AMILORIDE HCL 5 MG PO TABS: 5.0000 mg | ORAL_TABLET | Freq: Every day | ORAL | 3 refills | Status: DC

## 2017-09-21 MED ORDER — OMEPRAZOLE 40 MG PO CPDR: 40.0000 mg | DELAYED_RELEASE_CAPSULE | Freq: Every day | ORAL | 3 refills | Status: DC

## 2017-09-21 MED ORDER — DILTIAZEM HCL ER COATED BEADS 120 MG PO CP24: 120.0000 mg | ORAL_CAPSULE | Freq: Every morning | ORAL | 3 refills | Status: DC

## 2017-09-21 MED ORDER — AMLODIPINE BESYLATE 10 MG PO TABS: 10.0000 mg | ORAL_TABLET | Freq: Every day | ORAL | 3 refills | Status: DC

## 2017-09-21 MED ORDER — DIAZEPAM 10 MG PO TABS: 10.0000 mg | ORAL_TABLET | Freq: Every day | ORAL | 0 refills | Status: DC | PRN

## 2017-09-21 MED ORDER — HYDRALAZINE HCL 25 MG PO TABS: 25.0000 mg | ORAL_TABLET | Freq: Three times a day (TID) | ORAL | 3 refills | Status: DC

## 2017-09-21 MED ORDER — BACLOFEN 20 MG PO TABS: 20.0000 mg | ORAL_TABLET | Freq: Two times a day (BID) | ORAL | 0 refills | Status: DC | PRN

## 2017-09-21 MED ORDER — TRAMADOL HCL 50 MG PO TABS: 50.0000 mg | ORAL_TABLET | Freq: Two times a day (BID) | ORAL | 1 refills | Status: DC | PRN

## 2017-09-21 MED ORDER — FLUTICASONE PROPIONATE 50 MCG/ACT NA SUSP: NASAL | 3 refills | Status: DC

## 2017-09-21 NOTE — Telephone Encounter (Signed)
Amlodipine 10 mg refill request  Has appt this morning (8/7) with Dr. Leretha PolSantiago at 9:40am.  Refill during OV?

## 2017-09-21 NOTE — Progress Notes (Signed)
8/7/20198:52 AM  Brooke Wilson [birth year], 62 y.o. female [id number]  Chief Complaint  Patient presents with  . Establish Care    having swelling in the feet and allergies    HPI:   Patient is a 62 y.o. female with past medical history significant for HTN, GERD, HLP, seasonal allergies, asthma, insomnia and DDD cervical spine who presents today to establish care  GERD - ompreazole and pepcid, well controlled She does use compression stockings Seasonal allergies/asthma - currently active allergies but not asthma, uses flonase sporadic, takes singulair daily, albuterol uses prn, uses spirava not advair, she is concerned about foaming in her mouth from mouth breathing DDD cervical - takes med prn, baclofen, valium, tramadol, ibuprofen. tramadol does provide relief when needed. Takes valium prn as well. Valium usually once a day. Tramadol takes about twice a day. Takes motrin 800mg  TID. Naproxen hurt her stomach  Previous PCP Dr Katrinka Blazing, last appt march 2019 Cards _ Dr Rayne Du, last appt march 2019, normal EF, rx compression stockings Pain mgt - does not see anymore due to being in [place]  LDL 68 Normal TSH, crt, LFTs and CBC  UDS done: today [place] CSR reviewed: today Last tramadol rx: 07/11/17 Last valium rx: 04/22/17 CSA signed: today  Fall Risk  09/21/2017 04/22/2017 11/13/2016 04/15/2016 11/12/2015  Falls in the past year? No No No No No     Depression screen [facility] 2/9 09/21/2017 04/22/2017 11/13/2016  Decreased Interest 0 0 0  Down, Depressed, Hopeless 0 0 0  PHQ - 2 Score 0 0 0    Allergies  Allergen Reactions  . Ace Inhibitors Cough  . Iodinated Diagnostic Agents   . Lisinopril Cough  . Sulfa Antibiotics   . Bactrim Rash  . Flexeril [Cyclobenzaprine Hcl] Nausea Only  . Latex Itching  . Naprosyn [Naproxen] Nausea And Vomiting    And high doses of NSAIDS    Prior to Admission medications   Medication Sig Start Date End Date Taking? Authorizing Provider    albuterol (PROVENTIL HFA) 108 (90 Base) MCG/ACT inhaler INHALE 2 PUFFS INTO THE LUNGS EVERY 6 HOURS AS NEEDED FOR WHEEZING OR SHORTNESS OF BREATH 04/22/17  Yes Ethelda Chick, MD  aMILoride (MIDAMOR) 5 MG tablet Take 1 tablet (5 mg total) by mouth daily. 04/28/17  Yes Baldo Daub, MD  amoxicillin-clavulanate (AUGMENTIN) 875-125 MG tablet Take 1 tablet by mouth 2 (two) times daily. 04/22/17  Yes Ethelda Chick, MD  aspirin 81 MG tablet Take 81 mg by mouth daily.   Yes [provider]  baclofen (LIORESAL) 20 MG tablet Take 1 tablet (20 mg total) by mouth 2 (two) times daily. 11/12/15  Yes Ethelda Chick, MD  carvedilol (COREG) 12.5 MG tablet Take 1 tablet (12.5 mg total) by mouth 2 (two) times daily. 04/28/17  Yes Baldo Daub, MD  diazepam (VALIUM) 10 MG tablet Take 1 tablet (10 mg total) by mouth daily as needed for anxiety. 04/22/17  Yes Ethelda Chick, MD  diltiazem (CARDIZEM CD) 120 MG 24 hr capsule Take 1 capsule (120 mg total) by mouth every morning. 04/28/17  Yes Baldo Daub, MD  diltiazem (CARDIZEM CD) 240 MG 24 hr capsule 1 CAPSULE IN EVENING 04/28/17  Yes Munley, Iline Oven, MD  famotidine (PEPCID) 40 MG tablet Take 1 tablet (40 mg total) by mouth 2 (two) times daily. 04/22/17  Yes Ethelda Chick, MD  fluticasone (FLONASE) 50 MCG/ACT nasal spray PLACE 2 SPRAYS IN THE NOSTRIL  EVERY DAY 04/22/17  Yes Ethelda Chick, MD  Fluticasone-Salmeterol (ADVAIR DISKUS) 250-50 MCG/DOSE AEPB INHALE 1 PUFF INTO THE LUNGS EVERY 12 (TWELVE) HOURS. 04/22/17  Yes Ethelda Chick, MD  hydrALAZINE (APRESOLINE) 25 MG tablet Take 1 tablet (25 mg total) by mouth 3 (three) times daily. 04/28/17  Yes Baldo Daub, MD  ibuprofen (ADVIL,MOTRIN) 800 MG tablet TAKE 1 TABLET BY MOUTH 3 TIMES A DAY 09/02/17  Yes Ethelda Chick, MD  latanoprost (XALATAN) 0.005 % ophthalmic solution PLACE 1 DROP INTO BOTH EYES TWICE A DAY 04/20/15  Yes Tonye Pearson, MD  montelukast (SINGULAIR) 10 MG tablet Take 1 tablet (10 mg  total) by mouth at bedtime. 04/22/17  Yes Ethelda Chick, MD  omeprazole (PRILOSEC) 40 MG capsule Take 1 capsule (40 mg total) by mouth daily. 04/22/17  Yes Ethelda Chick, MD  potassium chloride (KLOR-CON 10) 10 MEQ tablet Take 1 tablet (10 mEq total) by mouth daily. Requesting orange or yellow Klor-Con 04/28/17  Yes Munley, Iline Oven, MD  predniSONE (DELTASONE) 20 MG tablet Take 3 PO QAM x 1 day, 2 PO QAM x 5 days, 1 PO QAM x 5 days 04/22/17  Yes Ethelda Chick, MD  rosuvastatin (CRESTOR) 20 MG tablet 1 tablet daily 04/28/17  Yes Baldo Daub, MD  traMADol (ULTRAM) 50 MG tablet Take 1 tablet (50 mg total) by mouth every 12 (twelve) hours as needed. 04/22/17  Yes Ethelda Chick, MD  zinc sulfate 220 MG capsule Take 1 capsule (220 mg total) by mouth daily. 02/17/15  Yes Tonye Pearson, MD    Past Medical History:  Diagnosis Date  . Acute maxillary sinusitis 11/12/2015  . Allergy   . Anxiety state 05/17/2011  . AR (allergic rhinitis) 02/17/2012  . Asthma   . BMI 31.0-31.9,adult 03/23/2011  . Cough 03/23/2011  . Dental disease 02/17/2012   [place]ppers replaced in [place] 01/2012   . Essential hypertension, benign 03/23/2011  . GERD (gastroesophageal reflux disease) 05/17/2011  . H/O varicella   . History of measles, mumps, or rubella   . Hyperlipemia 03/23/2011  . Hyperlipidemia   . Hypertension   . Hypertensive disorder 09/30/2016  . Insomnia 05/17/2011  . Mid back pain 02/17/2012  . Neck pain 02/17/2012  . Obesity 09/30/2016  . 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 RAD (reactive airway disease) 02/17/2012    Past Surgical History:  Procedure Laterality Date  . TUBAL LIGATION    . VAGINAL HYSTERECTOMY     uterine prolapse; DUB; ovaries intact.  [facility]    Social History   Tobacco Use  . Smoking status: Never Smoker  . Smokeless tobacco: Never Used  Substance Use Topics  . Alcohol use: No    Alcohol/week: 0.0 oz    Family History  Problem Relation Age of Onset  . Hypertension Mother   . Stroke  Mother   . Hypertension Brother   . Stroke Brother   . Cancer Maternal Grandmother        ovarian  . Hypertension Sister   . Stroke Sister 65       CVA    Review of Systems  Constitutional: Negative for chills and fever.  HENT: Positive for congestion and sore throat.   Respiratory: Negative for cough, shortness of breath and wheezing.   Cardiovascular: Positive for leg swelling. Negative for chest pain and palpitations.  Gastrointestinal: Negative for abdominal pain, nausea and vomiting.  Endo/Heme/Allergies: Positive for environmental allergies.  OBJECTIVE:  Blood pressure 120/68, pulse 76, temperature 98.6 F (37 C), temperature source Oral, height 5' (1.524 m), weight 150 lb 9.6 oz (68.3 kg), SpO2 94 %. Body mass index is 29.41 kg/m.   Physical Exam  Constitutional: She is oriented to person, place, and time. She appears well-developed and well-nourished.  HENT:  Head: Normocephalic and atraumatic.  Right Ear: Hearing, tympanic membrane, external ear and ear canal normal.  Left Ear: Hearing, tympanic membrane, external ear and ear canal normal.  Mouth/Throat: Oropharynx is clear and moist.  Eyes: Pupils are equal, round, and reactive to light. EOM are normal.  Neck: Neck supple.  Cardiovascular: Normal rate, regular rhythm and normal heart sounds. Exam reveals no gallop and no friction rub.  No murmur heard. Pulmonary/Chest: Effort normal and breath sounds normal. She has no wheezes. She has no rales.  Lymphadenopathy:    She has no cervical adenopathy.  Neurological: She is alert and oriented to person, place, and time.  Skin: Skin is warm and dry.  Psychiatric: She has a normal mood and affect.  Nursing note and vitals reviewed.   ASSESSMENT and PLAN  1. Non-seasonal allergic rhinitis, unspecified trigger Currently active, has started using meds again as she uses them prn. Patient has no acute concerns nor desire to change treatment  2. Mild intermittent  asthma without complication Not active. Continue current regime of daily singulair.  3. Gastroesophageal reflux disease, esophagitis presence not specified Controlled. Continue current regime of PPI and H2B  4. Hyperlipidemia, unspecified hyperlipidemia type Controlled. Continue current regime.   5. Chronic neck pain Stable. Medications r/se/b reviewed. Takes prn. PMP appropriate and supportive of prn use.  - baclofen (LIORESAL) 20 MG tablet; Take 1 tablet (20 mg total) by mouth 2 (two) times daily as needed for muscle spasms.  7. Essential hypertension Controlled. Continue current regime. Sees cards in Oct. - carvedilol (COREG) 12.5 MG tablet; Take 1 tablet (12.5 mg total) by mouth 2 (two) times daily.  8. Therapeutic drug monitoring - ToxASSURE Select 13 (MW), Urine - Tramadol Screen, Urine  Other orders - albuterol (PROVENTIL HFA) 108 (90 Base) MCG/ACT inhaler; INHALE 2 PUFFS INTO THE LUNGS EVERY 6 HOURS AS NEEDED FOR WHEEZING OR SHORTNESS OF BREATH - diazepam (VALIUM) 10 MG tablet; Take 1 tablet (10 mg total) by mouth daily as needed (muscle spasms). - aMILoride (MIDAMOR) 5 MG tablet; Take 1 tablet (5 mg total) by mouth daily. - diltiazem (CARDIZEM CD) 120 MG 24 hr capsule; Take 1 capsule (120 mg total) by mouth every morning. - diltiazem (CARDIZEM CD) 240 MG 24 hr capsule; 1 CAPSULE IN EVENING - fluticasone (FLONASE) 50 MCG/ACT nasal spray; PLACE 2 SPRAYS IN THE NOSTRIL EVERY DAY - hydrALAZINE (APRESOLINE) 25 MG tablet; Take 1 tablet (25 mg total) by mouth 3 (three) times daily. - ibuprofen (ADVIL,MOTRIN) 800 MG tablet; Take 1 tablet (800 mg total) by mouth 2 (two) times daily as needed. - montelukast (SINGULAIR) 10 MG tablet; Take 1 tablet (10 mg total) by mouth at bedtime. - omeprazole (PRILOSEC) 40 MG capsule; Take 1 capsule (40 mg total) by mouth daily. - rosuvastatin (CRESTOR) 20 MG tablet; 1 tablet daily - traMADol (ULTRAM) 50 MG tablet; Take 1 tablet (50 mg total) by  mouth every 12 (twelve) hours as needed. - amLODipine (NORVASC) 10 MG tablet; Take 1 tablet (10 mg total) by mouth daily. - potassium chloride (KLOR-CON 10) 10 MEQ tablet; Take 3 tablets (30 mEq total) by mouth daily. Requesting orange or yellow  Klor-Con - famotidine (PEPCID) 40 MG tablet; Take 1 tablet (40 mg total) by mouth 2 (two) times daily.  Return in about 6 months (around 03/24/2018).    Myles LippsIrma M Santiago, MD [facility] at [facility] [address] [place], [place] [address] Ph.  [phone] Fax [phone]

## 2017-09-21 NOTE — Patient Instructions (Addendum)
  If you decide to use baclofen but NOT use valium as they are both muscle relaxants and can cause depress your breathing, make you confused and dizzy, cause falls.  Do not take a muscle relaxant and tramadol together from same reasons as above   IF you received an x-ray today, you will receive an invoice from [facility]. Please contact [facility] at [phone] with questions or concerns regarding your invoice.   IF you received labwork today, you will receive an invoice from [facility]. Please contact [facility] at [phone] with questions or concerns regarding your invoice.   Our billing staff will not be able to assist you with questions regarding bills from these companies.  You will be contacted with the lab results as soon as they are available. The fastest way to get your results is to activate your My Chart account. Instructions are located on the last page of this paperwork. If you have not heard from us regarding the results in 2 weeks, please contact this office.

## 2017-09-27 LAB — TRAMADOL SCREEN, URINE: Tramadol Screen, Urine: POSITIVE ng/mL — AB

## 2017-09-27 LAB — TOXASSURE SELECT 13 (MW), URINE

## 2017-10-11 ENCOUNTER — Telehealth: Payer: Self-pay | Admitting: Family Medicine

## 2017-10-11 NOTE — Telephone Encounter (Signed)
Called patient in regards to her appt she has with Dr. Leretha PolSantiago on 04/13/2018. The provider will be out of the office and would need to be rescheduled. Her cell phone number is disconnected, no dpr on file.

## 2017-10-30 ENCOUNTER — Other Ambulatory Visit: Payer: Self-pay | Admitting: Family Medicine

## 2017-10-30 DIAGNOSIS — M542 Cervicalgia: Principal | ICD-10-CM

## 2017-10-30 DIAGNOSIS — G8929 Other chronic pain: Secondary | ICD-10-CM

## 2017-10-31 NOTE — Telephone Encounter (Signed)
Baclofen refill Last Refill:09/21/17 #90 Last OV: 09/21/17 PCP: Dr. Leretha PolSantiago

## 2017-11-09 ENCOUNTER — Encounter: Payer: Federal, State, Local not specified - PPO | Admitting: Family Medicine

## 2017-11-21 ENCOUNTER — Encounter: Payer: Federal, State, Local not specified - PPO | Admitting: Family Medicine

## 2017-11-26 ENCOUNTER — Other Ambulatory Visit: Payer: Self-pay | Admitting: Family Medicine

## 2017-12-01 ENCOUNTER — Encounter: Payer: Federal, State, Local not specified - PPO | Admitting: Family Medicine

## 2017-12-08 NOTE — Telephone Encounter (Signed)
DONE

## 2018-01-07 ENCOUNTER — Other Ambulatory Visit: Payer: Self-pay | Admitting: Family Medicine

## 2018-01-09 NOTE — Telephone Encounter (Signed)
Requested medication (s) are due for refill today: yes  Requested medication (s) are on the active medication list: yes  Last refill:  09/21/17 90 tabs and 3 refills  Future visit scheduled: yes  Notes to clinic:  Last K+ lab 04/22/17  Requested Prescriptions  Pending Prescriptions Disp Refills   KLOR-CON 10 10 MEQ tablet [Pharmacy Med Name: KLOR-CON 10 MEQ TABLET] 270 tablet 1    Sig: TAKE 3 TABLETS (30 MEQ TOTAL) BY MOUTH DAILY. REQUESTING ORANGE OR YELLOW KLOR-CON     Endocrinology:  Minerals - Potassium Supplementation Passed - 01/07/2018  9:17 AM      Passed - K in normal range and within 360 days    Potassium  Date Value Ref Range Status  04/22/2017 4.1 3.5 - 5.2 mmol/L Final         Passed - Cr in normal range and within 360 days    Creat  Date Value Ref Range Status  06/02/2015 0.63 0.50 - 1.05 mg/dL Final   Creatinine, Ser  Date Value Ref Range Status  04/22/2017 0.70 0.57 - 1.00 mg/dL Final         Passed - Valid encounter within last 12 months    Recent Outpatient Visits          3 months ago Non-seasonal allergic rhinitis, unspecified trigger   [facility], Meda CoffeeIrma M, MD   8 months ago Essential hypertension, benign   [facility], Myrle ShengKristi M, MD   1 year ago Routine physical examination   [facility], Myrle ShengKristi M, MD   1 year ago Dysuria   [facility], Godfrey PickKimberly S, FNP   2 years ago Other seasonal allergic rhinitis   [facility], Myrle ShengKristi M, MD      Future Appointments            In 3 months Myles LippsSantiago, Irma M, MD [facility], [facility]

## 2018-01-11 ENCOUNTER — Other Ambulatory Visit: Payer: Self-pay | Admitting: *Deleted

## 2018-01-11 MED ORDER — HYDRALAZINE HCL 25 MG PO TABS: 25.0000 mg | ORAL_TABLET | Freq: Three times a day (TID) | ORAL | 0 refills | Status: DC

## 2018-01-11 NOTE — Telephone Encounter (Signed)
30 day supply of hydral[place] [facility] [place]. Patient is overdue for follow up and will have to schedule an appointment before further refills will be provided.

## 2018-01-16 ENCOUNTER — Other Ambulatory Visit: Payer: Self-pay | Admitting: Family Medicine

## 2018-01-16 NOTE — Telephone Encounter (Signed)
Copied from CRM [id number]. Topic: Quick Communication - Rx Refill/Question >> Jan 16, 2018  4:08 PM Wyonia HoughJohnson, Chaz E wrote: Medication: KLOR-CON 10 10 MEQ tablet   Has the patient contacted their pharmacy? Yes.   Pharmacy advised pt to call Dr.  Rock NephewPt next appt with Doctor is not until February of 2020   Preferred Pharmacy (with phone number or street name): [id number]- [place], [place] - [address] [phone] (Phone) [phone] (Fax)    Agent: Please be advised that RX refills may take up to 3 business days. We ask that you follow-up with your pharmacy.

## 2018-01-17 NOTE — Telephone Encounter (Signed)
Attempted to call the pt x 2 but no answer and service states that wireless customer not available. [facility] and spoke with Cathlean Cowerlesia who states that the pt did not pick up prescription that was sent in on 01/10/18 and the prescription is still available for pick up.

## 2018-01-20 ENCOUNTER — Other Ambulatory Visit: Payer: Self-pay | Admitting: Family Medicine

## 2018-01-20 MED ORDER — POTASSIUM CHLORIDE ER 10 MEQ PO TBCR: EXTENDED_RELEASE_TABLET | ORAL | 0 refills | Status: DC

## 2018-01-20 MED ORDER — LACTULOSE 10 GM/15ML PO SOLN: 10.0000 g | Freq: Three times a day (TID) | ORAL | 0 refills | Status: DC

## 2018-01-20 NOTE — Progress Notes (Signed)
Patient here with her husband Requesting refill of kor-con Requesting for lactulose for constipation, miralax not helping

## 2018-01-26 ENCOUNTER — Telehealth: Payer: Self-pay | Admitting: Family Medicine

## 2018-01-26 NOTE — Telephone Encounter (Signed)
LVM on the number listed on DPR - asking pt to call in and reschedule appt with Dr. Leretha PolSantiago on 04/14/18. Due to providers schedule change, pt will need to reschedule. If/When pt calls in, please schedule at their convenience. Thank you!

## 2018-02-26 ENCOUNTER — Other Ambulatory Visit: Payer: Self-pay | Admitting: Family Medicine

## 2018-02-27 NOTE — Telephone Encounter (Signed)
Requested medication (s) are due for refill today: Yes  Requested medication (s) are on the active medication list: Yes  Last refill:  09/21/17  Future visit scheduled: Yes  Notes to clinic:  Unable to refill per protocol     Requested Prescriptions  Pending Prescriptions Disp Refills   traMADol (ULTRAM) 50 MG tablet [Pharmacy Med Name: TRAMADOL HCL 50 MG TABLET] 60 tablet 1    Sig: Take 1 tablet (50 mg total) by mouth every 12 (twelve) hours as needed.     Not Delegated - Analgesics:  Opioid Agonists Failed - 02/26/2018  2:15 PM      Failed - This refill cannot be delegated      Failed - Urine Drug Screen completed in last 360 days.      Passed - Valid encounter within last 6 months    Recent Outpatient Visits          5 months ago Non-seasonal allergic rhinitis, unspecified trigger   [facility], Meda Coffee, MD   10 months ago Essential hypertension, benign   Primary Care at [facility], Myrle Sheng, MD   1 year ago Routine physical examination   Primary Care at [facility], Myrle Sheng, MD   1 year ago Dysuria   Primary Care at [facility], Godfrey Pick, FNP   2 years ago Other seasonal allergic rhinitis   Primary Care at [facility], Myrle Sheng, MD      Future Appointments            In 1 month [name], MD Primary Care at [place], [facility]

## 2018-03-02 NOTE — Telephone Encounter (Signed)
Please advise 

## 2018-04-13 ENCOUNTER — Ambulatory Visit: Payer: Federal, State, Local not specified - PPO | Admitting: Family Medicine

## 2018-04-14 ENCOUNTER — Ambulatory Visit: Payer: Federal, State, Local not specified - PPO | Admitting: Family Medicine

## 2018-04-18 ENCOUNTER — Ambulatory Visit: Payer: Federal, State, Local not specified - PPO

## 2018-04-18 ENCOUNTER — Ambulatory Visit (INDEPENDENT_AMBULATORY_CARE_PROVIDER_SITE_OTHER): Payer: Federal, State, Local not specified - PPO | Admitting: Emergency Medicine

## 2018-04-18 DIAGNOSIS — I10 Essential (primary) hypertension: Secondary | ICD-10-CM | POA: Diagnosis not present

## 2018-04-18 NOTE — Progress Notes (Signed)
Nurse visit for lab only visit.  

## 2018-04-19 LAB — COMPREHENSIVE METABOLIC PANEL
ALT: 30 IU/L (ref 0–32)
AST: 21 IU/L (ref 0–40)
Albumin/Globulin Ratio: 1.7 (ref 1.2–2.2)
Albumin: 4.1 g/dL (ref 3.8–4.8)
Alkaline Phosphatase: 97 IU/L (ref 39–117)
BUN/Creatinine Ratio: 24 (ref 12–28)
BUN: 19 mg/dL (ref 8–27)
Bilirubin Total: <0.2 mg/dL (ref 0.0–1.2)
CO2: 21 mmol/L (ref 20–29)
Calcium: 9.1 mg/dL (ref 8.7–10.3)
Chloride: 112 mmol/L — ABNORMAL HIGH (ref 96–106)
Creatinine, Ser: 0.8 mg/dL (ref 0.57–1.00)
GFR calc Af Amer: 91 mL/min/{1.73_m2} (ref >59)
GFR calc non Af Amer: 79 mL/min/{1.73_m2} (ref >59)
Globulin, Total: 2.4 g/dL (ref 1.5–4.5)
Glucose: 101 mg/dL — ABNORMAL HIGH (ref 65–99)
Potassium: 4.6 mmol/L (ref 3.5–5.2)
Sodium: 144 mmol/L (ref 134–144)
Total Protein: 6.5 g/dL (ref 6.0–8.5)

## 2018-04-19 LAB — CBC
Hematocrit: 39.2 % (ref 34.0–46.6)
Hemoglobin: 12.4 g/dL (ref 11.1–15.9)
MCH: 26.4 pg — ABNORMAL LOW (ref 26.6–33.0)
MCHC: 31.6 g/dL (ref 31.5–35.7)
MCV: 84 fL (ref 79–97)
Platelets: 275 10*3/uL (ref 150–450)
RBC: 4.69 x10E6/uL (ref 3.77–5.28)
RDW: 14.2 % (ref 11.7–15.4)
WBC: 7.7 10*3/uL (ref 3.4–10.8)

## 2018-04-19 LAB — LIPID PANEL
Chol/HDL Ratio: 2.7 ratio (ref 0.0–4.4)
Cholesterol, Total: 204 mg/dL — ABNORMAL HIGH (ref 100–199)
HDL: 76 mg/dL (ref >39)
LDL Calculated: 104 mg/dL — ABNORMAL HIGH (ref 0–99)
Triglycerides: 119 mg/dL (ref 0–149)
VLDL Cholesterol Cal: 24 mg/dL (ref 5–40)

## 2018-04-19 LAB — TSH: TSH: 1.99 u[IU]/mL (ref 0.450–4.500)

## 2018-04-21 ENCOUNTER — Ambulatory Visit: Payer: Federal, State, Local not specified - PPO | Admitting: Family Medicine

## 2018-04-21 ENCOUNTER — Encounter: Payer: Self-pay | Admitting: Family Medicine

## 2018-04-21 ENCOUNTER — Other Ambulatory Visit: Payer: Self-pay

## 2018-04-21 VITALS — BP 134/72 | HR 75 | Temp 98.7°F | Ht 60.0 in | Wt 155.2 lb

## 2018-04-21 DIAGNOSIS — I10 Essential (primary) hypertension: Secondary | ICD-10-CM | POA: Diagnosis not present

## 2018-04-21 DIAGNOSIS — Z8639 Personal history of other endocrine, nutritional and metabolic disease: Secondary | ICD-10-CM

## 2018-04-21 DIAGNOSIS — Z79899 Other long term (current) drug therapy: Secondary | ICD-10-CM

## 2018-04-21 DIAGNOSIS — E785 Hyperlipidemia, unspecified: Secondary | ICD-10-CM

## 2018-04-21 DIAGNOSIS — M542 Cervicalgia: Secondary | ICD-10-CM

## 2018-04-21 DIAGNOSIS — K219 Gastro-esophageal reflux disease without esophagitis: Secondary | ICD-10-CM

## 2018-04-21 DIAGNOSIS — F411 Generalized anxiety disorder: Secondary | ICD-10-CM

## 2018-04-21 DIAGNOSIS — R7303 Prediabetes: Secondary | ICD-10-CM

## 2018-04-21 DIAGNOSIS — Z8619 Personal history of other infectious and parasitic diseases: Secondary | ICD-10-CM | POA: Insufficient documentation

## 2018-04-21 DIAGNOSIS — G8929 Other chronic pain: Secondary | ICD-10-CM

## 2018-04-21 DIAGNOSIS — Z1231 Encounter for screening mammogram for malignant neoplasm of breast: Secondary | ICD-10-CM

## 2018-04-21 DIAGNOSIS — Z5181 Encounter for therapeutic drug level monitoring: Secondary | ICD-10-CM

## 2018-04-21 HISTORY — DX: Prediabetes: R73.03

## 2018-04-21 LAB — POCT GLYCOSYLATED HEMOGLOBIN (HGB A1C): Hemoglobin A1C: 6.3 % — AB (ref 4.0–5.6)

## 2018-04-21 MED ORDER — LACTULOSE 10 GM/15ML PO SOLN: 10.0000 g | Freq: Three times a day (TID) | ORAL | 1 refills | Status: DC

## 2018-04-21 MED ORDER — OMEPRAZOLE 40 MG PO CPDR: 40.0000 mg | DELAYED_RELEASE_CAPSULE | Freq: Every day | ORAL | 3 refills | Status: DC

## 2018-04-21 MED ORDER — DILTIAZEM HCL ER COATED BEADS 240 MG PO CP24: ORAL_CAPSULE | ORAL | 3 refills | Status: DC

## 2018-04-21 MED ORDER — MONTELUKAST SODIUM 10 MG PO TABS: 10.0000 mg | ORAL_TABLET | Freq: Every day | ORAL | 3 refills | Status: DC

## 2018-04-21 MED ORDER — BACLOFEN 20 MG PO TABS: 20.0000 mg | ORAL_TABLET | Freq: Two times a day (BID) | ORAL | 0 refills | Status: DC | PRN

## 2018-04-21 MED ORDER — HYDRALAZINE HCL 25 MG PO TABS: 25.0000 mg | ORAL_TABLET | Freq: Three times a day (TID) | ORAL | 3 refills | Status: DC

## 2018-04-21 MED ORDER — ALBUTEROL SULFATE HFA 108 (90 BASE) MCG/ACT IN AERS: INHALATION_SPRAY | RESPIRATORY_TRACT | 5 refills | Status: DC

## 2018-04-21 MED ORDER — CARVEDILOL 12.5 MG PO TABS: 12.5000 mg | ORAL_TABLET | Freq: Two times a day (BID) | ORAL | 3 refills | Status: DC

## 2018-04-21 MED ORDER — FLUTICASONE PROPIONATE 50 MCG/ACT NA SUSP: NASAL | 3 refills | Status: DC

## 2018-04-21 MED ORDER — AMILORIDE HCL 5 MG PO TABS: 5.0000 mg | ORAL_TABLET | Freq: Every day | ORAL | 3 refills | Status: DC

## 2018-04-21 MED ORDER — POTASSIUM CHLORIDE ER 10 MEQ PO TBCR: EXTENDED_RELEASE_TABLET | ORAL | 0 refills | Status: DC

## 2018-04-21 MED ORDER — ROSUVASTATIN CALCIUM 20 MG PO TABS: ORAL_TABLET | ORAL | 3 refills | Status: DC

## 2018-04-21 MED ORDER — AMLODIPINE BESYLATE 10 MG PO TABS: 10.0000 mg | ORAL_TABLET | Freq: Every day | ORAL | 3 refills | Status: DC

## 2018-04-21 MED ORDER — FAMOTIDINE 40 MG PO TABS: 40.0000 mg | ORAL_TABLET | Freq: Every day | ORAL | 3 refills | Status: DC

## 2018-04-21 MED ORDER — DIAZEPAM 10 MG PO TABS: 10.0000 mg | ORAL_TABLET | Freq: Every day | ORAL | 1 refills | Status: DC | PRN

## 2018-04-21 MED ORDER — IBUPROFEN 800 MG PO TABS: 800.0000 mg | ORAL_TABLET | Freq: Two times a day (BID) | ORAL | 1 refills | Status: DC | PRN

## 2018-04-21 MED ORDER — DILTIAZEM HCL ER COATED BEADS 120 MG PO CP24: 120.0000 mg | ORAL_CAPSULE | Freq: Every morning | ORAL | 3 refills | Status: DC

## 2018-04-21 MED ORDER — TRAMADOL HCL 50 MG PO TABS: 50.0000 mg | ORAL_TABLET | Freq: Two times a day (BID) | ORAL | 1 refills | Status: DC | PRN

## 2018-04-21 NOTE — Progress Notes (Signed)
3/6/20203:54 PM  Brooke Wilson Jul 30, 1955, 63 y.o. female [id number]  Chief Complaint  Patient presents with  . Hypertension  . Medication Refill    on all meds    HPI:   Patient is a 63 y.o. female with past medical history significant for HTN, asthma, GERD, HLP, chronic back and neck pain on tramadol, anxiety on bzd prn, pre-diabetes who presents today for routine followup  Last OV aug 2019 pmp reviewed Last tramadol rx jan 2020 #60 Last valium rx aug 2019 #30  Overall doing well Allergies acting up, but nothing new Takes singulair, flonase Uses albuterol when she feels nasal congestion Has not used advair in a long time Does have occ "bronchitis" from really bad allergies Denies any chest tightness, SOB or wheezing  Reports she had a normal colonoscopy about 3-4 years ago with Dr Loreta Ave  Gets seasonal flu vaccine at work - [employer],  nursing home, every sept. 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, also needed for work  gerd well controlled on pepcid in AM and prilosec at bedtime  H/o hypokalemia, seen by renal, stable since on amiloride and KCL supplementation  Lab Results  Component Value Date   CREATININE 0.80 04/18/2018   BUN 19 04/18/2018   NA 144 04/18/2018   K 4.6 04/18/2018   CL 112 (H) 04/18/2018   CO2 21 04/18/2018   Lab Results  Component Value Date   WBC 7.7 04/18/2018   HGB 12.4 04/18/2018   HCT 39.2 04/18/2018   MCV 84 04/18/2018   PLT 275 04/18/2018   Lab Results  Component Value Date   CHOL 204 (H) 04/18/2018   HDL 76 04/18/2018   LDLCALC 104 (H) 04/18/2018   TRIG 119 04/18/2018   CHOLHDL 2.7 04/18/2018   Lab Results  Component Value Date   TSH 1.990 04/18/2018    Fall Risk  04/21/2018 09/21/2017 04/22/2017 11/13/2016 04/15/2016  Falls in the past year? 0 No No No No  Number falls in past yr: 0 - - - -  Injury with Fall? 0 - - - -     Depression screen [facility] 2/9 04/21/2018 09/21/2017 04/22/2017  Decreased Interest 0 0 0    Down, Depressed, Hopeless 0 0 0  PHQ - 2 Score 0 0 0    Allergies  Allergen Reactions  . Ace Inhibitors Cough  . Iodinated Diagnostic Agents   . Lisinopril Cough  . Sulfa Antibiotics   . Bactrim Rash  . Flexeril [Cyclobenzaprine Hcl] Nausea Only  . Latex Itching  . Naprosyn [Naproxen] Nausea And Vomiting    And high doses of NSAIDS    Prior to Admission medications   Medication Sig Start Date End Date Taking? Authorizing Provider  albuterol (PROVENTIL HFA) 108 (90 Base) MCG/ACT inhaler INHALE 2 PUFFS INTO THE LUNGS EVERY 6 HOURS AS NEEDED FOR WHEEZING OR SHORTNESS OF BREATH 09/21/17  Yes [name], MD  aMILoride (MIDAMOR) 5 MG tablet Take 1 tablet (5 mg total) by mouth daily. 09/21/17  Yes [name], MD  amLODipine (NORVASC) 10 MG tablet Take 1 tablet (10 mg total) by mouth daily. 09/21/17  Yes [name], MD  aspirin 81 MG tablet Take 81 mg by mouth daily.   Yes [provider]  baclofen (LIORESAL) 20 MG tablet TAKE 1 TABLET (20 MG TOTAL) BY MOUTH 2 (TWO) TIMES DAILY AS NEEDED FOR MUSCLE SPASMS. 11/02/17  Yes [name], MD  carvedilol (COREG) 12.5 MG  tablet Take 1 tablet (12.5 mg total) by mouth 2 (two) times daily. 09/21/17  Yes [name], MD  diazepam (VALIUM) 10 MG tablet Take 1 tablet (10 mg total) by mouth daily as needed (muscle spasms). 09/21/17  Yes [name], MD  diltiazem (CARDIZEM CD) 120 MG 24 hr capsule Take 1 capsule (120 mg total) by mouth every morning. 09/21/17  Yes [name], MD  diltiazem (CARDIZEM CD) 240 MG 24 hr capsule 1 CAPSULE IN EVENING 09/21/17  Yes [name], MD  famotidine (PEPCID) 40 MG tablet Take 1 tablet (40 mg total) by mouth 2 (two) times daily. 09/21/17  Yes [name], MD  fluticasone (FLONASE) 50 MCG/ACT nasal spray PLACE 2 SPRAYS IN THE NOSTRIL EVERY DAY 09/21/17  Yes [name], MD  Fluticasone-Salmeterol (ADVAIR DISKUS) 250-50 MCG/DOSE AEPB INHALE 1 PUFF INTO THE LUNGS EVERY 12 (TWELVE)  HOURS. 04/22/17  Yes Ethelda Chick, MD  hydrALAZINE (APRESOLINE) 25 MG tablet Take 1 tablet (25 mg total) by mouth 3 (three) times daily. 01/11/18  Yes Baldo Daub, MD  ibuprofen (ADVIL,MOTRIN) 800 MG tablet TAKE 1 TABLET (800 MG TOTAL) BY MOUTH 2 (TWO) TIMES DAILY AS NEEDED. 11/28/17  Yes [name], MD  lactulose (CHRONULAC) 10 GM/15ML solution Take 15 mLs (10 g total) by mouth 3 (three) times daily. 01/20/18  Yes [name], MD  latanoprost (XALATAN) 0.005 % ophthalmic solution PLACE 1 DROP INTO BOTH EYES TWICE A DAY 04/20/15  Yes Tonye Pearson, MD  montelukast (SINGULAIR) 10 MG tablet Take 1 tablet (10 mg total) by mouth at bedtime. 09/21/17  Yes [name], MD  omeprazole (PRILOSEC) 40 MG capsule Take 1 capsule (40 mg total) by mouth daily. 09/21/17  Yes [name], MD  potassium chloride (KLOR-CON 10) 10 MEQ tablet TAKE 3 TABLETS (30 MEQ TOTAL) BY MOUTH DAILY. REQUESTING ORANGE OR YELLOW KLOR-CON 01/20/18  Yes [name], MD  rosuvastatin (CRESTOR) 20 MG tablet 1 tablet daily 09/21/17  Yes [name], MD  traMADol (ULTRAM) 50 MG tablet TAKE 1 TABLET (50 MG TOTAL) BY MOUTH EVERY 12 (TWELVE) HOURS AS NEEDED. 03/02/18  Yes [name], MD  zinc sulfate 220 MG capsule Take 1 capsule (220 mg total) by mouth daily. 02/17/15  Yes Tonye Pearson, MD    Past Medical History:  Diagnosis Date  . Acute maxillary sinusitis 11/12/2015  . Allergy   . Anxiety state 05/17/2011  . AR (allergic rhinitis) 02/17/2012  . Asthma   . BMI 31.0-31.9,adult 03/23/2011  . Cough 03/23/2011  . Dental disease 02/17/2012   Uppers replaced in [place] 01/2012   . Essential hypertension, benign 03/23/2011  . GERD (gastroesophageal reflux disease) 05/17/2011  . H/O varicella   . History of measles, mumps, or rubella   . Hyperlipemia 03/23/2011  . Hyperlipidemia   . Hypertension   . Hypertensive disorder 09/30/2016  . Insomnia 05/17/2011  . Mid back pain 02/17/2012  . Neck pain 02/17/2012    . Obesity 09/30/2016  . 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 RAD (reactive airway disease) 02/17/2012    Past Surgical History:  Procedure Laterality Date  . TUBAL LIGATION    . VAGINAL HYSTERECTOMY     uterine prolapse; DUB; ovaries intact.  [place]    Social History   Tobacco Use  . Smoking status: Never Smoker  . Smokeless tobacco: Never Used  Substance Use Topics  . Alcohol use: No  Alcohol/week: 0.0 standard drinks    Family History  Problem Relation Age of Onset  . Hypertension Mother   . Stroke Mother   . Hypertension Brother   . Stroke Brother   . Cancer Maternal Grandmother        ovarian  . Hypertension Sister   . Stroke Sister 73       CVA    Review of Systems  Constitutional: Negative for chills and fever.  Respiratory: Negative for cough and shortness of breath.   Cardiovascular: Negative for chest pain, palpitations and leg swelling.  Gastrointestinal: Negative for abdominal pain, nausea and vomiting.     OBJECTIVE:  Blood pressure 134/72, pulse 75, temperature 98.7 F (37.1 C), temperature source Oral, height 5' (1.524 m), weight 155 lb 3.2 oz (70.4 kg), SpO2 96 %. Body mass index is 30.31 kg/m.   Physical Exam Vitals signs and nursing note reviewed.  Constitutional:      Appearance: She is well-developed.  HENT:     Head: Normocephalic and atraumatic.     Mouth/Throat:     Pharynx: No oropharyngeal exudate.  Eyes:     General: No scleral icterus.    Conjunctiva/sclera: Conjunctivae normal.     Pupils: Pupils are equal, round, and reactive to light.  Neck:     Musculoskeletal: Neck supple.  Cardiovascular:     Rate and Rhythm: Normal rate and regular rhythm.     Heart sounds: Normal heart sounds. No murmur. No friction rub. No gallop.   Pulmonary:     Effort: Pulmonary effort is normal.     Breath sounds: Normal breath sounds. No wheezing or rales.  Skin:    General: Skin is warm and dry.  Neurological:     Mental Status: She is  alert and oriented to person, place, and time.     Results for orders placed or performed in visit on 04/21/18 (from the past 24 hour(s))  POCT glycosylated hemoglobin (Hb A1C)     Status: Abnormal   Collection Time: 04/21/18  4:32 PM  Result Value Ref Range   Hemoglobin A1C 6.3 (A) 4.0 - 5.6 %   HbA1c POC (<> result, manual entry)     HbA1c, POC (prediabetic range)     HbA1c, POC (controlled diabetic range)      ASSESSMENT and PLAN  1. Prediabetes Stable. Cont with LFM - POCT glycosylated hemoglobin (Hb A1C) - Hemoglobin A1c; Future  2. Medication management - ToxASSURE Select 13 (MW), Urine  3. Visit for screening mammogram - MM DIGITAL SCREENING BILATERAL; Future  4. Essential hypertension Controlled. Continue current regime.  - carvedilol (COREG) 12.5 MG tablet; Take 1 tablet (12.5 mg total) by mouth 2 (two) times daily. - Comprehensive metabolic panel; Future  5. Hyperlipidemia, unspecified hyperlipidemia type Controlled. Continue current regime.  - Comprehensive metabolic panel; Future  6. Anxiety state Stable. pmp reviewed. Med refilled  7. Chronic neck pain Stable. pmp reviewed. Med refilled - baclofen (LIORESAL) 20 MG tablet; Take 1 tablet (20 mg total) by mouth 2 (two) times daily as needed for muscle spasms.  8. Therapeutic drug monitoring  9. Gastroesophageal reflux disease, esophagitis presence not specified Stable. Cont current regime - famotidine (PEPCID) 40 MG tablet; Take 1 tablet (40 mg total) by mouth daily.  10. H/O hypokalemia Stable. Cont current regime - Comprehensive metabolic panel; Future  Other orders - amLODipine (NORVASC) 10 MG tablet; Take 1 tablet (10 mg total) by mouth daily. - diazepam (VALIUM) 10 MG tablet; Take  1 tablet (10 mg total) by mouth daily as needed (muscle spasms). - diltiazem (CARDIZEM CD) 120 MG 24 hr capsule; Take 1 capsule (120 mg total) by mouth every morning. - diltiazem (CARDIZEM CD) 240 MG 24 hr capsule; 1  CAPSULE IN EVENING - fluticasone (FLONASE) 50 MCG/ACT nasal spray; PLACE 2 SPRAYS IN THE NOSTRIL EVERY DAY - hydrALAZINE (APRESOLINE) 25 MG tablet; Take 1 tablet (25 mg total) by mouth 3 (three) times daily. - ibuprofen (ADVIL,MOTRIN) 800 MG tablet; Take 1 tablet (800 mg total) by mouth 2 (two) times daily as needed. - lactulose (CHRONULAC) 10 GM/15ML solution; Take 15 mLs (10 g total) by mouth 3 (three) times daily. - montelukast (SINGULAIR) 10 MG tablet; Take 1 tablet (10 mg total) by mouth at bedtime. - omeprazole (PRILOSEC) 40 MG capsule; Take 1 capsule (40 mg total) by mouth daily. - potassium chloride (KLOR-CON 10) 10 MEQ tablet; TAKE 3 TABLETS (30 MEQ TOTAL) BY MOUTH DAILY. REQUESTING ORANGE OR YELLOW KLOR-CON - rosuvastatin (CRESTOR) 20 MG tablet; 1 tablet daily - traMADol (ULTRAM) 50 MG tablet; Take 1 tablet (50 mg total) by mouth every 12 (twelve) hours as needed. - albuterol (PROVENTIL HFA) 108 (90 Base) MCG/ACT inhaler; INHALE 2 PUFFS INTO THE LUNGS EVERY 6 HOURS AS NEEDED FOR WHEEZING OR SHORTNESS OF BREATH - aMILoride (MIDAMOR) 5 MG tablet; Take 1 tablet (5 mg total) by mouth daily.  Return in about 6 months (around 10/22/2018) for routine followup.    [name], MD Primary Care at [facility] [address] [place], [place] [address] Ph.  [phone] Fax [phone]

## 2018-04-27 LAB — TOXASSURE SELECT 13 (MW), URINE

## 2018-07-15 DIAGNOSIS — Z1231 Encounter for screening mammogram for malignant neoplasm of breast: Secondary | ICD-10-CM | POA: Diagnosis not present

## 2018-07-16 ENCOUNTER — Other Ambulatory Visit: Payer: Self-pay | Admitting: Family Medicine

## 2018-09-04 ENCOUNTER — Telehealth: Payer: Self-pay | Admitting: Cardiology

## 2018-09-04 DIAGNOSIS — E785 Hyperlipidemia, unspecified: Secondary | ICD-10-CM

## 2018-09-04 DIAGNOSIS — I10 Essential (primary) hypertension: Secondary | ICD-10-CM

## 2018-09-04 NOTE — Telephone Encounter (Signed)
CMP and lipid profile 

## 2018-09-04 NOTE — Telephone Encounter (Signed)
Attempted to call patient. No answer no voicemail. will continue efforts

## 2018-09-04 NOTE — Telephone Encounter (Signed)
Patient is asking about having her labs drawn prior to her appt but I do not see any [facility] labs to be drawn.

## 2018-09-04 NOTE — Telephone Encounter (Signed)
Please advise what you would like drawn for this patient. Thanks

## 2018-09-05 ENCOUNTER — Other Ambulatory Visit: Payer: Self-pay | Admitting: Cardiology

## 2018-09-05 ENCOUNTER — Other Ambulatory Visit: Payer: Self-pay

## 2018-09-05 DIAGNOSIS — I10 Essential (primary) hypertension: Secondary | ICD-10-CM | POA: Diagnosis not present

## 2018-09-05 DIAGNOSIS — Z6831 Body mass index (BMI) 31.0-31.9, adult: Secondary | ICD-10-CM

## 2018-09-05 DIAGNOSIS — E6609 Other obesity due to excess calories: Secondary | ICD-10-CM | POA: Diagnosis not present

## 2018-09-05 DIAGNOSIS — R7303 Prediabetes: Secondary | ICD-10-CM

## 2018-09-05 DIAGNOSIS — E785 Hyperlipidemia, unspecified: Secondary | ICD-10-CM

## 2018-09-05 NOTE — Telephone Encounter (Signed)
Patient called back. Informed of labs entered for appointment, Will come 09/05/18.

## 2018-09-06 LAB — COMPREHENSIVE METABOLIC PANEL
ALT: 14 IU/L (ref 0–32)
AST: 14 IU/L (ref 0–40)
Albumin/Globulin Ratio: 2.3 — ABNORMAL HIGH (ref 1.2–2.2)
Albumin: 4.3 g/dL (ref 3.8–4.8)
Alkaline Phosphatase: 90 IU/L (ref 39–117)
BUN/Creatinine Ratio: 29 — ABNORMAL HIGH (ref 12–28)
BUN: 15 mg/dL (ref 8–27)
Bilirubin Total: 0.2 mg/dL (ref 0.0–1.2)
CO2: 18 mmol/L — ABNORMAL LOW (ref 20–29)
Calcium: 9.1 mg/dL (ref 8.7–10.3)
Chloride: 109 mmol/L — ABNORMAL HIGH (ref 96–106)
Creatinine, Ser: 0.52 mg/dL — ABNORMAL LOW (ref 0.57–1.00)
GFR calc Af Amer: 118 mL/min/{1.73_m2} (ref >59)
GFR calc non Af Amer: 103 mL/min/{1.73_m2} (ref >59)
Globulin, Total: 1.9 g/dL (ref 1.5–4.5)
Glucose: 91 mg/dL (ref 65–99)
Potassium: 4.2 mmol/L (ref 3.5–5.2)
Sodium: 143 mmol/L (ref 134–144)
Total Protein: 6.2 g/dL (ref 6.0–8.5)

## 2018-09-06 LAB — HEMOGLOBIN A1C
Est. average glucose Bld gHb Est-mCnc: 114 mg/dL
Hgb A1c MFr Bld: 5.6 % (ref 4.8–5.6)

## 2018-09-06 LAB — LIPID PANEL
Chol/HDL Ratio: 2.2 ratio (ref 0.0–4.4)
Cholesterol, Total: 143 mg/dL (ref 100–199)
HDL: 66 mg/dL (ref >39)
LDL Calculated: 63 mg/dL (ref 0–99)
Triglycerides: 72 mg/dL (ref 0–149)
VLDL Cholesterol Cal: 14 mg/dL (ref 5–40)

## 2018-09-06 NOTE — Progress Notes (Signed)
Cardiology Office Note:    Date:  09/07/2018   ID:  Brooke Wilson, DOB 25-Jan-1956, MRN [id number]  PCP:  Myles LippsSantiago, Irma M, MD  Cardiologist:  Norman HerrlichBrian Tonia Avino, MD    Referring MD: Myles LippsSantiago, Irma M, MD    ASSESSMENT:    1. Essential hypertension, benign   2. Mixed hyperlipidemia   3. Essential hypertension    PLAN:    In order of problems listed above:  1. Stable BP at target she truly is resistant hypertension renal withdraw amlodipine and substitute centrally active Catapres and continue her other antihypertensive agents 2. Stable lipids are ideal continue her statin 3. Market edema due to calcium channel blocker try to mitigate by substituting clonidine for Norvasc   Next appointment: 6 mos   Medication Adjustments/Labs and Tests Ordered: Current medicines are reviewed at length with the patient today.  Concerns regarding medicines are outlined above.  Orders Placed This Encounter  Procedures  . EKG 12-Lead   Meds ordered this encounter  Medications  . cloNIDine (CATAPRES) 0.1 MG tablet    Sig: Take 1 tablet (0.1 mg total) by mouth at bedtime.    Dispense:  30 tablet    Refill:  3    No chief complaint on file.   History of Present Illness:    Brooke Breamvangeline Faddis is a 63 y.o. female with a hx of hypertension, hyperlipidemia and a normal MPI with chest pain  last seen 04/29/2018. Compliance with diet, lifestyle and medications: Yes  Her very resistant hypertension is better controlled and typically runs less than 150/80.  Today she is at target in office 134/74.  She still finds her self fatigue.  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  She has not had the illness.  She has diffuse edema and takes 2 calcium channel blockers but no orthopnea chest pain palpitation or syncope.  Recent labs show her lipids to be ideal normal liver renal function potassium. Past Medical History:  Diagnosis Date  . Acute maxillary  sinusitis 11/12/2015  . Allergy   . Anxiety state 05/17/2011  . AR (allergic rhinitis) 02/17/2012  . Asthma   . BMI 31.0-31.9,adult 03/23/2011  . Cough 03/23/2011  . Dental disease 02/17/2012   [place] 01[place]2012   . Essential hypertension, benign 03/23/2011  . GERD (gastroesophageal reflux disease) 05/17/2011  . H/O varicella   . History of measles, mumps, or rubella   . Hyperlipemia 03/23/2011  . Hyperlipidemia   . Hypertension   . Hypertensive disorder 09/30/2016  . Insomnia 05/17/2011  . Mid back pain 02/17/2012  . Neck pain 02/17/2012  . Obesity 09/30/2016  . 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 RAD (reactive airway disease) 02/17/2012    Past Surgical History:  Procedure Laterality Date  . TUBAL LIGATION    . VAGINAL HYSTERECTOMY     uterine prolapse; DUB; ovaries intact.  [facility]    Current Medications: Current Meds  Medication Sig  . albuterol (PROVENTIL HFA) 108 (90 Base) MCG/ACT inhaler INHALE 2 PUFFS INTO THE LUNGS EVERY 6 HOURS AS NEEDED FOR WHEEZING OR SHORTNESS OF BREATH  . aMILoride (MIDAMOR) 5 MG tablet Take 1 tablet (5 mg total) by mouth daily.  Marland Kitchen. aspirin 81 MG tablet Take 81 mg by mouth daily.  . baclofen (LIORESAL) 20 MG tablet Take 1 tablet (20 mg total) by mouth 2 (two) times daily as needed for muscle spasms.  . carvedilol (COREG) 12.5 MG tablet Take 1 tablet (  12.5 mg total) by mouth 2 (two) times daily.  . diazepam (VALIUM) 10 MG tablet Take 1 tablet (10 mg total) by mouth daily as needed (muscle spasms).  Marland Kitchen. diltiazem (CARDIZEM CD) 120 MG 24 hr capsule Take 1 capsule (120 mg total) by mouth every morning.  . diltiazem (CARDIZEM CD) 240 MG 24 hr capsule 1 CAPSULE IN EVENING  . famotidine (PEPCID) 40 MG tablet Take 1 tablet (40 mg total) by mouth daily.  . fluticasone (FLONASE) 50 MCG/ACT nasal spray PLACE 2 SPRAYS IN THE NOSTRIL EVERY DAY  . hydrALAZINE (APRESOLINE) 25 MG tablet Take 1 tablet (25 mg total) by mouth 3 (three) times daily.  Marland Kitchen. ibuprofen  (ADVIL,MOTRIN) 800 MG tablet Take 1 tablet (800 mg total) by mouth 2 (two) times daily as needed.  . lactulose (CHRONULAC) 10 GM/15ML solution Take 15 mLs (10 g total) by mouth 3 (three) times daily.  Marland Kitchen. latanoprost (XALATAN) 0.005 % ophthalmic solution PLACE 1 DROP INTO BOTH EYES TWICE A DAY  . montelukast (SINGULAIR) 10 MG tablet Take 1 tablet (10 mg total) by mouth at bedtime.  Marland Kitchen. omeprazole (PRILOSEC) 40 MG capsule Take 1 capsule (40 mg total) by mouth daily.  . potassium chloride (KLOR-CON 10) 10 MEQ tablet TAKE 3 TABLETS BY MOUTH DAILY  . rosuvastatin (CRESTOR) 20 MG tablet 1 tablet daily  . temazepam (RESTORIL) 15 MG capsule Take 15 mg by mouth at bedtime as needed for sleep.  . traMADol (ULTRAM) 50 MG tablet Take 1 tablet (50 mg total) by mouth every 12 (twelve) hours as needed.  . zinc sulfate 220 MG capsule Take 1 capsule (220 mg total) by mouth daily.  . [DISCONTINUED] amLODipine (NORVASC) 10 MG tablet Take 1 tablet (10 mg total) by mouth daily.     Allergies:   Ace inhibitors, Iodinated diagnostic agents, Lisinopril, Sulfa antibiotics, Bactrim, Flexeril [cyclobenzaprine hcl], Latex, and Naprosyn [naproxen]   Social History   Socioeconomic History  . Marital status: Married    Spouse name: Not on file  . Number of children: Not on file  . Years of education: Not on file  . Highest education level: Not on file  Occupational History  . Not on file  Social Needs  . Financial resource strain: Not on file  . Food insecurity    Worry: Not on file    Inability: Not on file  . Transportation needs    Medical: Not on file    Non-medical: Not on file  Tobacco Use  . Smoking status: Never Smoker  . Smokeless tobacco: Never Used  Substance and Sexual Activity  . Alcohol use: No    Alcohol/week: 0.0 standard drinks  . Drug use: No  . Sexual activity: Yes    Birth control/protection: Post-menopausal, Surgical  Lifestyle  . Physical activity    Days per week: Not on file     Minutes per session: Not on file  . Stress: Not on file  Relationships  . Social Musicianconnections    Talks on phone: Not on file    Gets together: Not on file    Attends religious service: Not on file    Active member of club or organization: Not on file    Attends meetings of clubs or organizations: Not on file    Relationship status: Not on file  Other Topics Concern  . Not on file  Social History Narrative   Marital status:  Married x 38 years; from the [place]; moved to [place] in 1987.  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      Advanced Directives: DNR/DNI; HCPOA: Sati Segar-Enicole     Family History: The patient's family history includes Cancer in her maternal grandmother; Hypertension in her brother, mother, and sister; Stroke in her brother and mother; Stroke (age of onset: 75) in her sister. ROS:   Please see the history of present illness.    All other systems reviewed and are negative.  EKGs/Labs/Other Studies Reviewed:    The following studies were reviewed today:  EKG:  EKG ordered today and personally reviewed.  The ekg ordered today demonstrates sinus rhythm left axis deviation incomplete right bundle branch block  Recent Labs: 04/18/2018: Hemoglobin 12.4; Platelets 275; TSH 1.990 09/05/2018: ALT 14; BUN 15; Creatinine, Ser 0.52; Potassium 4.2; Sodium 143  Recent Lipid Panel    Component Value Date/Time   CHOL 143 09/05/2018 0816   TRIG 72 09/05/2018 0816   HDL 66 09/05/2018 0816   CHOLHDL 2.2 09/05/2018 0816   CHOLHDL 2.9 02/17/2015 1956   VLDL 35 (H) 02/17/2015 1956   LDLCALC 63 09/05/2018 0816    Physical Exam:    VS:  BP 134/74 (BP Location: Right Arm, Patient Position: Sitting, Cuff Size: Normal)   Pulse 74   Temp (!) 97.5 F (36.4 C)    Ht 4\' 11"  (1.499 m)   Wt 153 lb 12.8 oz (69.8 kg)   SpO2 98%   BMI 31.06 kg/m     Wt Readings from Last 3 Encounters:  09/07/18 153 lb 12.8 oz (69.8 kg)  04/21/18 155 lb 3.2 oz (70.4 kg)  09/21/17 150 lb 9.6 oz (68.3 kg)     GEN:  Well nourished, well developed in no acute distress HEENT: Normal NECK: No JVD; No carotid bruits LYMPHATICS: No lymphadenopathy CARDIAC: RRR, no murmurs, rubs, gallops RESPIRATORY:  Clear to auscultation without rales, wheezing or rhonchi  ABDOMEN: She has marked doughy lower extremity edema calcium channel blocker non-tender, non-distended MUSCULOSKELETAL:  No edema; No deformity  SKIN: Warm and dry NEUROLOGIC:  Alert and oriented x 3 PSYCHIATRIC:  Normal affect    Signed, Shirlee More, MD  09/07/2018 5:06 PM    [facility]

## 2018-09-07 ENCOUNTER — Other Ambulatory Visit: Payer: Self-pay

## 2018-09-07 ENCOUNTER — Ambulatory Visit (INDEPENDENT_AMBULATORY_CARE_PROVIDER_SITE_OTHER): Payer: Federal, State, Local not specified - PPO | Admitting: Cardiology

## 2018-09-07 VITALS — BP 134/74 | HR 74 | Temp 97.5°F | Ht 59.0 in | Wt 153.8 lb

## 2018-09-07 DIAGNOSIS — E782 Mixed hyperlipidemia: Secondary | ICD-10-CM

## 2018-09-07 DIAGNOSIS — I10 Essential (primary) hypertension: Secondary | ICD-10-CM

## 2018-09-07 MED ORDER — CLONIDINE HCL 0.1 MG PO TABS: 0.1000 mg | ORAL_TABLET | Freq: Every day | ORAL | 3 refills | Status: DC

## 2018-09-07 NOTE — Patient Instructions (Signed)
Medication Instructions:  Your physician has recommended you make the following change in your medication:   STOP:  Norvasc START:  Clonidine 0.1mg  one tablet by mouth at night  If you need a refill on your cardiac medications before your next appointment, please call your pharmacy.   Lab work: NONE If you have labs (blood work) drawn today and your tests are completely normal, you will receive your results only by: Marland Kitchen MyChart Message (if you have MyChart) OR . A paper copy in the mail If you have any lab test that is abnormal or we need to change your treatment, we will call you to review the results.  Testing/Procedures: You had an EKG today  Follow-Up: At [facility], you and your health needs are our priority.  As part of our continuing mission to provide you with exceptional heart care, we have created designated Provider Care Teams.  These Care Teams include your primary Cardiologist (physician) and Advanced Practice Providers (APPs -  Physician Assistants and Nurse Practitioners) who all work together to provide you with the care you need, when you need it. You will need a follow up appointment in 3 months.  Please call our office 2 months in advance to schedule this appointment.

## 2018-09-11 ENCOUNTER — Other Ambulatory Visit: Payer: Self-pay | Admitting: Family Medicine

## 2018-09-11 NOTE — Telephone Encounter (Signed)
Requested medications are due for refill today?  Yes  Requested medications are on the active medication list?  Yes  Last refill 04/21/2018  Future visit scheduled?  Yes- 10/19/2018  Notes to clinic  Requested Prescriptions  Pending Prescriptions Disp Refills   diazepam (VALIUM) 10 MG tablet [Pharmacy Med Name: DIAZEPAM 10 MG TABLET] 30 tablet 1    Sig: TAKE 1 TABLET BY MOUTH EVERY DAY AS NEEDED FOR MUSCLE SPASM     Not Delegated - Psychiatry:  Anxiolytics/Hypnotics Failed - 09/11/2018  4:12 PM      Failed - This refill cannot be delegated      Failed - Urine Drug Screen completed in last 360 days.      Passed - Valid encounter within last 6 months    Recent Outpatient Visits          4 months ago Prediabetes   [facility], Lilia Argue, MD   4 months ago Essential hypertension   Primary Care at [facility], [place], MD   11 months ago Non-seasonal allergic rhinitis, unspecified trigger   [facility], Lilia Argue, MD   1 year ago Essential hypertension, benign   Primary Care at [facility], Renette Butters, MD   1 year ago Routine physical examination   Primary Care at [facility], Renette Butters, MD      Future Appointments            In 1 month Rutherford Guys, MD Primary Care at [place], [facility]   In 2 months Bettina Gavia, Hilton Cork, MD [facility] [place]            traMADol (ULTRAM) 50 MG tablet [Pharmacy Med Name: TRAMADOL HCL 50 MG TABLET] 60 tablet 1    Sig: TAKE 1 TABLET BY MOUTH EVERY 12 HOURS AS NEEDED     Not Delegated - Analgesics:  Opioid Agonists Failed - 09/11/2018  4:12 PM      Failed - This refill cannot be delegated      Failed - Urine Drug Screen completed in last 360 days.      Passed - Valid encounter within last 6 months    Recent Outpatient Visits          4 months ago Prediabetes   [facility], Lilia Argue, MD   4 months ago Essential hypertension   Primary Care at [facility], [place], MD   11  months ago Non-seasonal allergic rhinitis, unspecified trigger   [facility], Lilia Argue, MD   1 year ago Essential hypertension, benign   Primary Care at [facility], Renette Butters, MD   1 year ago Routine physical examination   Primary Care at [facility], Renette Butters, MD      Future Appointments            In 1 month Rutherford Guys, MD Primary Care at [place], [facility]   In 2 months Bettina Gavia, Hilton Cork, MD [facility]

## 2018-09-13 NOTE — Telephone Encounter (Signed)
pmp reviewd, appropriate meds refilled 

## 2018-09-21 DIAGNOSIS — M1711 Unilateral primary osteoarthritis, right knee: Secondary | ICD-10-CM | POA: Diagnosis not present

## 2018-09-21 DIAGNOSIS — M170 Bilateral primary osteoarthritis of knee: Secondary | ICD-10-CM | POA: Diagnosis not present

## 2018-09-21 DIAGNOSIS — M25552 Pain in left hip: Secondary | ICD-10-CM | POA: Diagnosis not present

## 2018-10-11 ENCOUNTER — Other Ambulatory Visit: Payer: Self-pay | Admitting: Family Medicine

## 2018-10-19 ENCOUNTER — Other Ambulatory Visit: Payer: Self-pay

## 2018-10-19 ENCOUNTER — Ambulatory Visit: Payer: Federal, State, Local not specified - PPO | Admitting: Family Medicine

## 2018-10-19 ENCOUNTER — Encounter: Payer: Self-pay | Admitting: Family Medicine

## 2018-10-19 VITALS — BP 133/73 | HR 80 | Temp 98.3°F | Ht 59.0 in | Wt 153.0 lb

## 2018-10-19 DIAGNOSIS — M542 Cervicalgia: Secondary | ICD-10-CM

## 2018-10-19 DIAGNOSIS — Z8639 Personal history of other endocrine, nutritional and metabolic disease: Secondary | ICD-10-CM

## 2018-10-19 DIAGNOSIS — E782 Mixed hyperlipidemia: Secondary | ICD-10-CM

## 2018-10-19 DIAGNOSIS — G8929 Other chronic pain: Secondary | ICD-10-CM

## 2018-10-19 DIAGNOSIS — I10 Essential (primary) hypertension: Secondary | ICD-10-CM

## 2018-10-19 DIAGNOSIS — M1711 Unilateral primary osteoarthritis, right knee: Secondary | ICD-10-CM

## 2018-10-19 DIAGNOSIS — R7303 Prediabetes: Secondary | ICD-10-CM | POA: Diagnosis not present

## 2018-10-19 DIAGNOSIS — K219 Gastro-esophageal reflux disease without esophagitis: Secondary | ICD-10-CM

## 2018-10-19 DIAGNOSIS — J4520 Mild intermittent asthma, uncomplicated: Secondary | ICD-10-CM

## 2018-10-19 NOTE — Patient Instructions (Signed)
° ° ° °  If you have lab work done today you will be contacted with your lab results within the next 2 weeks.  If you have not heard from us then please contact us. The fastest way to get your results is to register for My Chart. ° ° °IF you received an x-ray today, you will receive an invoice from [facility]. Please contact [facility] at [phone] with questions or concerns regarding your invoice.  ° °IF you received labwork today, you will receive an invoice from [facility]. Please contact [facility] at [phone] with questions or concerns regarding your invoice.  ° °Our billing staff will not be able to assist you with questions regarding bills from these companies. ° °You will be contacted with the lab results as soon as they are available. The fastest way to get your results is to activate your My Chart account. Instructions are located on the last page of this paperwork. If you have not heard from us regarding the results in 2 weeks, please contact this office. °  ° ° ° °

## 2018-10-19 NOTE — Progress Notes (Signed)
9/3/20203:54 PM  Brooke Wilson 1955/04/07, 63 y.o., female [id number]  Chief Complaint  Patient presents with  . Hypertension  . pre dm    was told was she soes not have to be monitored for a1c    HPI:   Patient is a 63 y.o. 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  pmp reviewed Since last OV 6 months ago has filled tramadol 3 times, qty 9, last refill September 13 2018. Has filled diazepam twice, qty 30, last refill July 29th Takes both for msk pain (neck and knee) Also takes nsaids  Takes singulair every night Hardly ever needs albuterol unless exposed to triggers  gerd well controlled on PPI  Will get flu vaccine at work Getting covid test weekly   Lab Results  Component Value Date   CREATININE 0.52 (L) 09/05/2018   BUN 15 09/05/2018   NA 143 09/05/2018   K 4.2 09/05/2018   CL 109 (H) 09/05/2018   CO2 18 (L) 09/05/2018   Lab Results  Component Value Date   CHOL 143 09/05/2018   HDL 66 09/05/2018   LDLCALC 63 09/05/2018   TRIG 72 09/05/2018   CHOLHDL 2.2 09/05/2018   Lab Results  Component Value Date   HGBA1C 5.6 09/05/2018    Depression screen PHQ 2/9 10/19/2018  04/21/2018 09/21/2017  Decreased Interest 0 0 0  Down, Depressed, Hopeless 0 0 0  PHQ - 2 Score 0 0 0    Fall Risk  10/19/2018 04/21/2018 09/21/2017 04/22/2017 11/13/2016  Falls in the past year? 0 0 No No No  Number falls in past yr: 0 0 - - -  Injury with Fall? 0 0 - - -     Allergies  Allergen Reactions  . Ace Inhibitors Cough  . Iodinated Diagnostic Agents   . Lisinopril Cough  . Sulfa Antibiotics   . Bactrim Rash  . Flexeril [Cyclobenzaprine Hcl] Nausea Only  . Latex Itching  . Naprosyn [Naproxen] Nausea And Vomiting    And high doses of NSAIDS    Prior to Admission medications   Medication Sig Start Date End Date Taking? Authorizing Provider  albuterol (PROVENTIL HFA) 108 (90 Base) MCG/ACT inhaler INHALE 2 PUFFS INTO THE LUNGS EVERY 6 HOURS AS NEEDED FOR WHEEZING OR SHORTNESS OF BREATH 04/21/18  Yes Rutherford Guys, MD  aMILoride (MIDAMOR) 5 MG tablet Take 1 tablet (5 mg total) by mouth daily. 04/21/18  Yes Rutherford Guys, MD  aspirin 81 MG tablet Take 81 mg by mouth daily.   Yes [provider]  baclofen (LIORESAL) 20 MG tablet Take 1 tablet (20 mg total) by mouth 2 (two) times daily as  needed for muscle spasms. 04/21/18  Yes Myles LippsSantiago, Charnika Herbst M, MD  carvedilol (COREG) 12.5 MG tablet Take 1 tablet (12.5 mg total) by mouth 2 (two) times daily. 04/21/18  Yes Myles LippsSantiago, Kao Berkheimer M, MD  cloNIDine (CATAPRES) 0.1 MG tablet Take 1 tablet (0.1 mg total) by mouth at bedtime. 09/07/18  Yes Baldo DaubMunley, Brian J, MD  diazepam (VALIUM) 10 MG tablet TAKE 1 TABLET BY MOUTH EVERY DAY AS NEEDED FOR MUSCLE SPASM 09/13/18  Yes Myles LippsSantiago, Deirdra Heumann M, MD  diltiazem (CARDIZEM CD) 120 MG 24 hr capsule Take 1 capsule (120 mg total) by mouth every morning. 04/21/18  Yes Myles LippsSantiago, Daylah Sayavong M, MD  diltiazem (CARDIZEM CD) 240 MG 24 hr capsule 1 CAPSULE IN EVENING 04/21/18  Yes Myles LippsSantiago, Arn Mcomber M, MD  famotidine (PEPCID) 40 MG tablet Take 1 tablet (40 mg total) by mouth daily. 04/21/18  Yes Myles LippsSantiago, Seham Gardenhire M, MD  fluticasone [facility](FLONASE) 50  MCG/ACT nasal spray PLACE 2 SPRAYS IN THE NOSTRIL EVERY DAY 04/21/18  Yes Myles LippsSantiago, Carlisa Eble M, MD  hydrALAZINE (APRESOLINE) 25 MG tablet Take 1 tablet (25 mg total) by mouth 3 (three) times daily. 04/21/18  Yes Myles LippsSantiago, Ireta Pullman M, MD  ibuprofen (ADVIL,MOTRIN) 800 MG tablet Take 1 tablet (800 mg total) by mouth 2 (two) times daily as needed. 04/21/18  Yes Myles LippsSantiago, Pippa Hanif M, MD  lactulose (CHRONULAC) 10 GM/15ML solution Take 15 mLs (10 g total) by mouth 3 (three) times daily. 04/21/18  Yes Myles LippsSantiago, Niyah Mamaril M, MD  latanoprost (XALATAN) 0.005 % ophthalmic solution PLACE 1 DROP INTO BOTH EYES TWICE A DAY 04/20/15  Yes Tonye Pearsonoolittle, Robert P, MD  montelukast (SINGULAIR) 10 MG tablet Take 1 tablet (10 mg total) by mouth at bedtime. 04/21/18  Yes Myles LippsSantiago, Sakura Denis M, MD  omeprazole (PRILOSEC) 40 MG capsule Take 1 capsule (40 mg total) by mouth daily. 04/21/18  Yes Myles LippsSantiago, Jennipher Weatherholtz M, MD  potassium chloride (KLOR-CON 10) 10 MEQ tablet TAKE 3 TABLETS BY MOUTH EVERY DAY 10/11/18  Yes Myles LippsSantiago, Francesa Eugenio M, MD  rosuvastatin (CRESTOR) 20 MG tablet 1 tablet daily 04/21/18  Yes Myles LippsSantiago, Graceson Nichelson M, MD  temazepam (RESTORIL) 15 MG capsule Take 15 mg by mouth at bedtime as needed for sleep.   Yes [provider]  traMADol (ULTRAM) 50 MG tablet TAKE 1 TABLET BY MOUTH EVERY 12 HOURS AS NEEDED 09/13/18  Yes Myles LippsSantiago, Analyn Matusek M, MD  zinc sulfate 220 MG capsule Take 1 capsule (220 mg total) by mouth daily. 02/17/15  Yes Tonye Pearsonoolittle, Robert P, MD    Past Medical History:  Diagnosis Date  . Acute maxillary sinusitis 11/12/2015  . Allergy   . Anxiety state 05/17/2011  . AR (allergic rhinitis) 02/17/2012  . Asthma   . BMI 31.0-31.9,adult 03/23/2011  . Cough 03/23/2011  . Dental disease 02/17/2012   [place]ppers [place] in [place] 01/2012   . Essential hypertension, benign 03/23/2011  . GERD (gastroesophageal reflux disease) 05/17/2011  . H/O varicella   . History of measles, mumps, or rubella   . Hyperlipemia 03/23/2011  . Hyperlipidemia   . Hypertension   .  Hypertensive disorder 09/30/2016  . Insomnia 05/17/2011  . Mid back pain 02/17/2012  . Neck pain 02/17/2012  . Obesity 09/30/2016  . 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 RAD (reactive airway disease) 02/17/2012    Past Surgical History:  Procedure Laterality Date  . TUBAL LIGATION    . VAGINAL HYSTERECTOMY     uterine prolapse; DUB; ovaries intact.  [facility]    Social History   Tobacco Use  .  Smoking status: Never Smoker  . Smokeless tobacco: Never Used  Substance Use Topics  . Alcohol use: No    Alcohol/week: 0.0 standard drinks    Family History  Problem Relation Age of Onset  . Hypertension Mother   . Stroke Mother   . Hypertension Brother   . Stroke Brother   . Cancer Maternal Grandmother        ovarian  . Hypertension Sister   . Stroke Sister 27       CVA    Review of Systems  Constitutional: Negative for chills and fever.  Respiratory: Negative for cough and shortness of breath.   Cardiovascular: Negative for chest pain, palpitations and leg swelling.  Gastrointestinal: Negative for abdominal pain, nausea and vomiting.  per hpi   OBJECTIVE:  Today's Vitals   10/19/18 1552  BP: 133/73  Pulse: 80  Temp: 98.3 F (36.8 C)  SpO2: 98%  Weight: 153 lb (69.4 kg)  Height: 4\' 11"  (1.499 m)   Body mass index is 30.9 kg/m.   Physical Exam Vitals signs and nursing note reviewed.  Constitutional:      Appearance: She is well-developed.  HENT:     Head: Normocephalic and atraumatic.     Mouth/Throat:     Pharynx: No oropharyngeal exudate.  Eyes:     General: No scleral icterus.    Conjunctiva/sclera: Conjunctivae normal.     Pupils: Pupils are equal, round, and reactive to light.  Neck:     Musculoskeletal: Neck supple.  Cardiovascular:     Rate and Rhythm: Normal rate and regular rhythm.     Heart sounds: Normal heart sounds. No murmur. No friction rub. No gallop.   Pulmonary:     Effort: Pulmonary effort is normal.     Breath sounds: Normal breath  sounds. No wheezing or rales.  Skin:    General: Skin is warm and dry.  Neurological:     Mental Status: She is alert and oriented to person, place, and time.     No results found for this or any previous visit (from the past 24 hour(s)).  No results found.   ASSESSMENT and PLAN  1. Essential hypertension Controlled. Continue current regime.   2. Mixed hyperlipidemia Controlled. Continue current regime.   3. Prediabetes Normal a1c  4. H/O hypokalemia Normal k. Cont supplementation.  5. Mild intermittent asthma without complication Stable. Cont singulair and flonase. Albuterol prn. Letter for work given.  6. Gastroesophageal reflux disease, esophagitis presence not specified Controlled. Continue current regime.   7. Chronic neck pain 8. Primary osteoarthritis of right knee Discussed concern for recent increase in use. Mostly driven by Right knee which is better after steroid injection. Discussed risk of combined use of medications. Discussed goal of previous use, which was sporadic.  Return in about 6 months (around 04/18/2019).    [name], MD Primary Care at [facility] [address] [place], [place] [address] Ph.  [phone] Fax [phone]

## 2018-11-16 ENCOUNTER — Ambulatory Visit: Payer: Federal, State, Local not specified - PPO | Admitting: Cardiology

## 2018-11-17 ENCOUNTER — Encounter: Payer: Self-pay | Admitting: Cardiology

## 2018-11-17 ENCOUNTER — Other Ambulatory Visit: Payer: Self-pay

## 2018-11-17 ENCOUNTER — Ambulatory Visit (INDEPENDENT_AMBULATORY_CARE_PROVIDER_SITE_OTHER): Payer: Federal, State, Local not specified - PPO | Admitting: Cardiology

## 2018-11-17 VITALS — BP 140/86 | HR 84 | Ht 59.0 in | Wt 152.4 lb

## 2018-11-17 DIAGNOSIS — E782 Mixed hyperlipidemia: Secondary | ICD-10-CM

## 2018-11-17 DIAGNOSIS — I10 Essential (primary) hypertension: Secondary | ICD-10-CM

## 2018-11-17 MED ORDER — HYDRALAZINE HCL 25 MG PO TABS: 25.0000 mg | ORAL_TABLET | Freq: Three times a day (TID) | ORAL | 1 refills | Status: DC

## 2018-11-17 MED ORDER — POTASSIUM CHLORIDE ER 10 MEQ PO TBCR: EXTENDED_RELEASE_TABLET | ORAL | 1 refills | Status: DC

## 2018-11-17 MED ORDER — CARVEDILOL 12.5 MG PO TABS: 12.5000 mg | ORAL_TABLET | Freq: Two times a day (BID) | ORAL | 1 refills | Status: DC

## 2018-11-17 MED ORDER — CLONIDINE HCL 0.1 MG PO TABS: 0.1000 mg | ORAL_TABLET | Freq: Every day | ORAL | 1 refills | Status: DC

## 2018-11-17 MED ORDER — DILTIAZEM HCL ER COATED BEADS 240 MG PO CP24: ORAL_CAPSULE | ORAL | 1 refills | Status: DC

## 2018-11-17 MED ORDER — DILTIAZEM HCL ER COATED BEADS 120 MG PO CP24: 120.0000 mg | ORAL_CAPSULE | Freq: Every morning | ORAL | 1 refills | Status: DC

## 2018-11-17 NOTE — Progress Notes (Signed)
Cardiology Office Note:    Date:  11/17/2018   ID:  Brooke Breamvangeline Khamis, DOB 02/27/1955, MRN [id number]  PCP:  Myles LippsSantiago, Irma M, MD  Cardiologist:  Norman HerrlichBrian Kain Milosevic, MD    Referring MD: Myles LippsSantiago, Irma M, MD    ASSESSMENT:    1. Essential hypertension, benign   2. Mixed hyperlipidemia    PLAN:    In order of problems listed above:  1. Blood pressure is better controlled at target on the 5 drug regimen and has much less peripheral edema.  To new current treatment will need to recheck renal function potassium next visit. 2. stable continue her statin   Next appointment: 6535-month   Medication Adjustments/Labs and Tests Ordered: Current medicines are reviewed at length with the patient today.  Concerns regarding medicines are outlined above.  No orders of the defined types were placed in this encounter.  No orders of the defined types were placed in this encounter.   No chief complaint on file.   History of Present Illness:    Brooke Wilson is a 63 y.o. female with a hx of resistant hypertension asthma, GERD, HLP, chronic back and neck pain on tramadol, and pre-diabetes.  She was last seen 09/07/2018. Compliance with diet, lifestyle and medications: Yes  She is increasingly bothered by knee pain and is contemplating right total knee arthroplasty she asked me my opinion I told her I think from a cardiology perspective she is optimized and is a good surgical candidate and needs no further preoperative evaluation.  She is concerned about being out of work finances.  No edema chest pain palpitation or syncope. Past Medical History:  Diagnosis Date   Acute maxillary sinusitis 11/12/2015   Allergy    Anxiety state 05/17/2011   AR (allergic rhinitis) 02/17/2012   Asthma    BMI 31.0-31.9,adult 03/23/2011   Cough 03/23/2011   Dental disease 02/17/2012   [place] in [place] 01/2012    Essential hypertension, benign 03/23/2011   GERD (gastroesophageal reflux disease)  05/17/2011   H/O varicella    History of measles, mumps, or rubella    Hyperlipemia 03/23/2011   Hyperlipidemia    Hypertension    Hypertensive disorder 09/30/2016   Insomnia 05/17/2011   Mid back pain 02/17/2012   Neck pain 02/17/2012   Obesity 09/30/2016   Pelvic floor relaxation 09/30/2016   RAD (reactive airway disease) 02/17/2012    Past Surgical History:  Procedure Laterality Date   TUBAL LIGATION     VAGINAL HYSTERECTOMY     uterine prolapse; DUB; ovaries intact.  [place]    Current Medications: Current Meds  Medication Sig   albuterol (PROVENTIL HFA) 108 (90 Base) MCG/ACT inhaler INHALE 2 PUFFS INTO THE LUNGS EVERY 6 HOURS AS NEEDED FOR WHEEZING OR SHORTNESS OF BREATH   aMILoride (MIDAMOR) 5 MG tablet Take 1 tablet (5 mg total) by mouth daily.   aspirin 81 MG tablet Take 81 mg by mouth daily.   carvedilol (COREG) 12.5 MG tablet Take 1 tablet (12.5 mg total) by mouth 2 (two) times daily.   cloNIDine (CATAPRES) 0.1 MG tablet Take 1 tablet (0.1 mg total) by mouth at bedtime.   diazepam (VALIUM) 10 MG tablet TAKE 1 TABLET BY MOUTH EVERY DAY AS NEEDED FOR MUSCLE SPASM   diltiazem (CARDIZEM CD) 120 MG 24 hr capsule Take 1 capsule (120 mg total) by mouth every morning.   diltiazem (CARDIZEM CD) 240 MG 24 hr capsule 1 CAPSULE IN EVENING   famotidine (PEPCID) 40 MG tablet  Take 1 tablet (40 mg total) by mouth daily.   fluticasone (FLONASE) 50 MCG/ACT nasal spray PLACE 2 SPRAYS IN THE NOSTRIL EVERY DAY   hydrALAZINE (APRESOLINE) 25 MG tablet Take 1 tablet (25 mg total) by mouth 3 (three) times daily.   ibuprofen (ADVIL,MOTRIN) 800 MG tablet Take 1 tablet (800 mg total) by mouth 2 (two) times daily as needed.   lactulose (CHRONULAC) 10 GM/15ML solution Take 15 mLs (10 g total) by mouth 3 (three) times daily.   latanoprost (XALATAN) 0.005 % ophthalmic solution PLACE 1 DROP INTO BOTH EYES TWICE A DAY   montelukast (SINGULAIR) 10 MG tablet Take 1 tablet (10 mg total) by  mouth at bedtime.   omeprazole (PRILOSEC) 40 MG capsule Take 1 capsule (40 mg total) by mouth daily.   potassium chloride (KLOR-CON 10) 10 MEQ tablet TAKE 3 TABLETS BY MOUTH EVERY DAY   rosuvastatin (CRESTOR) 20 MG tablet 1 tablet daily   traMADol (ULTRAM) 50 MG tablet TAKE 1 TABLET BY MOUTH EVERY 12 HOURS AS NEEDED   zinc sulfate 220 MG capsule Take 1 capsule (220 mg total) by mouth daily.     Allergies:   Ace inhibitors, Iodinated diagnostic agents, Lisinopril, Sulfa antibiotics, Bactrim, Flexeril [cyclobenzaprine hcl], Latex, and Naprosyn [naproxen]   Social History   Socioeconomic History   Marital status: Married    Spouse name: Not on file   Number of children: Not on file   Years of education: Not on file   Highest education level: Not on file  Occupational History   Not on file  Social Needs   Financial resource strain: Not on file   Food insecurity    Worry: Not on file    Inability: Not on file   Transportation needs    Medical: Not on file    Non-medical: Not on file  Tobacco Use   Smoking status: Never Smoker   Smokeless tobacco: Never Used  Substance and Sexual Activity   Alcohol use: No    Alcohol/week: 0.0 standard drinks   Drug use: No   Sexual activity: Yes    Birth control/protection: Post-menopausal, Surgical  Lifestyle   Physical activity    Days per week: Not on file    Minutes per session: Not on file   Stress: Not on file  Relationships   Social connections    Talks on phone: Not on file    Gets together: Not on file    Attends religious service: Not on file    Active member of club or organization: Not on file    Attends meetings of clubs or organizations: Not on file    Relationship status: Not on file  Other Topics Concern   Not on file  Social History Narrative   Marital status:  Married x 38 years; from the [place]; moved to [place] in 1987.      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      Advanced Directives: DNR/DNI; HCPOA: [place]     Family History: The patient's family history includes Cancer in her maternal grandmother; Hypertension in her brother, mother, and sister; Stroke in her brother and  mother; Stroke (age of onset: 40) in her sister. ROS:   Please see the history of present illness.    All other systems reviewed and are negative.  EKGs/Labs/Other Studies Reviewed:    The following studies were reviewed today:  Recent Labs: 04/18/2018: Hemoglobin 12.4; Platelets 275; TSH 1.990 09/05/2018: ALT 14; BUN 15; Creatinine, Ser 0.52; Potassium 4.2; Sodium 143  Recent Lipid Panel    Component Value Date/Time   CHOL 143 09/05/2018 0816   TRIG 72 09/05/2018 0816   HDL 66 09/05/2018 0816   CHOLHDL 2.2 09/05/2018 0816   CHOLHDL 2.9 02/17/2015 1956   VLDL 35 (H) 02/17/2015 1956   LDLCALC 63 09/05/2018 0816    Physical Exam:    VS:  BP 140/86 (BP Location: Right Arm, Patient Position: Sitting, Cuff Size: Normal)    Pulse 84    Ht 4\' 11"  (1.499 m)    Wt 152 lb 6.4 oz (69.1 kg)    SpO2 96%    BMI 30.78 kg/m     Wt Readings from Last 3 Encounters:  11/17/18 152 lb 6.4 oz (69.1 kg)  10/19/18 153 lb (69.4 kg)  09/07/18 153 lb 12.8 oz (69.8 kg)     GEN:  Well nourished, well developed in no acute distress HEENT: Normal NECK: No JVD; No carotid bruits LYMPHATICS: No lymphadenopathy CARDIAC: RRR, no murmurs, rubs, gallops RESPIRATORY:  Clear to auscultation without rales, wheezing or rhonchi  ABDOMEN: Soft, non-tender, non-distended MUSCULOSKELETAL:  No edema; No deformity  SKIN: Warm and dry NEUROLOGIC:  Alert and oriented x 3 PSYCHIATRIC:  Normal affect    Signed, Shirlee More, MD  11/17/2018 3:55 PM    [facility]

## 2018-11-17 NOTE — Patient Instructions (Signed)
Medication Instructions:  Your physician recommends that you continue on your current medications as directed. Please refer to the Current Medication list given to you today.  If you need a refill on your cardiac medications before your next appointment, please call your pharmacy.   Lab work: None Ordered  Testing/Procedures: None Ordered  Follow-Up: At [facility], you and your health needs are our priority.  As part of our continuing mission to provide you with exceptional heart care, we have created designated Provider Care Teams.  These Care Teams include your primary Cardiologist (physician) and Advanced Practice Providers (APPs -  Physician Assistants and Nurse Practitioners) who all work together to provide you with the care you need, when you need it. You will need a follow up appointment in 6 months.  Please call our office 2 months in advance to schedule this appointment.  You may see Dr. Bettina Gavia or another member of our [facility] Provider Team in [place]: Jenne Campus, MD . Jyl Heinz, MD  Any Other Special Instructions Will Be Listed Below (If Applicable).

## 2018-11-23 ENCOUNTER — Other Ambulatory Visit: Payer: Self-pay | Admitting: Family Medicine

## 2018-11-23 NOTE — Telephone Encounter (Signed)
Forwarding medication refill request to the clinical pool for review. 

## 2018-11-30 ENCOUNTER — Encounter (HOSPITAL_COMMUNITY): Payer: Self-pay | Admitting: Emergency Medicine

## 2018-11-30 ENCOUNTER — Ambulatory Visit (INDEPENDENT_AMBULATORY_CARE_PROVIDER_SITE_OTHER)
Admission: EM | Admit: 2018-11-30 | Discharge: 2018-11-30 | Disposition: A | Discharge status: Home / Self Care | Payer: Federal, State, Local not specified - PPO | Source: Home / Self Care

## 2018-11-30 ENCOUNTER — Ambulatory Visit (HOSPITAL_COMMUNITY): Payer: Federal, State, Local not specified - PPO

## 2018-11-30 ENCOUNTER — Encounter (HOSPITAL_COMMUNITY): Payer: Self-pay

## 2018-11-30 ENCOUNTER — Inpatient Hospital Stay (HOSPITAL_COMMUNITY)
Admission: EM | Admit: 2018-11-30 | Discharge: 2018-12-04 | DRG: 872 | Disposition: A | Discharge status: Home / Self Care | Payer: Federal, State, Local not specified - PPO | Attending: Internal Medicine | Admitting: Internal Medicine

## 2018-11-30 ENCOUNTER — Other Ambulatory Visit: Payer: Self-pay

## 2018-11-30 DIAGNOSIS — J45909 Unspecified asthma, uncomplicated: Secondary | ICD-10-CM | POA: Diagnosis not present

## 2018-11-30 DIAGNOSIS — M549 Dorsalgia, unspecified: Secondary | ICD-10-CM | POA: Diagnosis present

## 2018-11-30 DIAGNOSIS — G8929 Other chronic pain: Secondary | ICD-10-CM | POA: Diagnosis present

## 2018-11-30 DIAGNOSIS — H5789 Other specified disorders of eye and adnexa: Secondary | ICD-10-CM | POA: Diagnosis not present

## 2018-11-30 DIAGNOSIS — K5732 Diverticulitis of large intestine without perforation or abscess without bleeding: Secondary | ICD-10-CM | POA: Diagnosis not present

## 2018-11-30 DIAGNOSIS — F411 Generalized anxiety disorder: Secondary | ICD-10-CM | POA: Diagnosis present

## 2018-11-30 DIAGNOSIS — Z7982 Long term (current) use of aspirin: Secondary | ICD-10-CM

## 2018-11-30 DIAGNOSIS — Z6830 Body mass index (BMI) 30.0-30.9, adult: Secondary | ICD-10-CM

## 2018-11-30 DIAGNOSIS — A419 Sepsis, unspecified organism: Principal | ICD-10-CM | POA: Diagnosis not present

## 2018-11-30 DIAGNOSIS — Z881 Allergy status to other antibiotic agents status: Secondary | ICD-10-CM

## 2018-11-30 DIAGNOSIS — I1A0 Resistant hypertension: Secondary | ICD-10-CM | POA: Diagnosis present

## 2018-11-30 DIAGNOSIS — E876 Hypokalemia: Secondary | ICD-10-CM | POA: Diagnosis present

## 2018-11-30 DIAGNOSIS — K56699 Other intestinal obstruction unspecified as to partial versus complete obstruction: Secondary | ICD-10-CM | POA: Diagnosis present

## 2018-11-30 DIAGNOSIS — E785 Hyperlipidemia, unspecified: Secondary | ICD-10-CM | POA: Diagnosis present

## 2018-11-30 DIAGNOSIS — R1084 Generalized abdominal pain: Secondary | ICD-10-CM | POA: Diagnosis not present

## 2018-11-30 DIAGNOSIS — F419 Anxiety disorder, unspecified: Secondary | ICD-10-CM | POA: Diagnosis present

## 2018-11-30 DIAGNOSIS — Z886 Allergy status to analgesic agent status: Secondary | ICD-10-CM

## 2018-11-30 DIAGNOSIS — J4520 Mild intermittent asthma, uncomplicated: Secondary | ICD-10-CM | POA: Diagnosis not present

## 2018-11-30 DIAGNOSIS — Z882 Allergy status to sulfonamides status: Secondary | ICD-10-CM

## 2018-11-30 DIAGNOSIS — I10 Essential (primary) hypertension: Secondary | ICD-10-CM | POA: Diagnosis present

## 2018-11-30 DIAGNOSIS — Z888 Allergy status to other drugs, medicaments and biological substances status: Secondary | ICD-10-CM

## 2018-11-30 DIAGNOSIS — N179 Acute kidney failure, unspecified: Secondary | ICD-10-CM | POA: Diagnosis not present

## 2018-11-30 DIAGNOSIS — E669 Obesity, unspecified: Secondary | ICD-10-CM | POA: Diagnosis present

## 2018-11-30 DIAGNOSIS — Z79899 Other long term (current) drug therapy: Secondary | ICD-10-CM

## 2018-11-30 DIAGNOSIS — K631 Perforation of intestine (nontraumatic): Secondary | ICD-10-CM | POA: Diagnosis present

## 2018-11-30 DIAGNOSIS — E782 Mixed hyperlipidemia: Secondary | ICD-10-CM | POA: Diagnosis not present

## 2018-11-30 DIAGNOSIS — K219 Gastro-esophageal reflux disease without esophagitis: Secondary | ICD-10-CM | POA: Diagnosis present

## 2018-11-30 DIAGNOSIS — N182 Chronic kidney disease, stage 2 (mild): Secondary | ICD-10-CM | POA: Diagnosis not present

## 2018-11-30 DIAGNOSIS — Z90710 Acquired absence of both cervix and uterus: Secondary | ICD-10-CM

## 2018-11-30 DIAGNOSIS — R7303 Prediabetes: Secondary | ICD-10-CM | POA: Diagnosis present

## 2018-11-30 DIAGNOSIS — K5720 Diverticulitis of large intestine with perforation and abscess without bleeding: Secondary | ICD-10-CM | POA: Diagnosis present

## 2018-11-30 DIAGNOSIS — I1310 Hypertensive heart and chronic kidney disease without heart failure, with stage 1 through stage 4 chronic kidney disease, or unspecified chronic kidney disease: Secondary | ICD-10-CM | POA: Diagnosis not present

## 2018-11-30 DIAGNOSIS — M542 Cervicalgia: Secondary | ICD-10-CM | POA: Diagnosis present

## 2018-11-30 DIAGNOSIS — Z20828 Contact with and (suspected) exposure to other viral communicable diseases: Secondary | ICD-10-CM | POA: Diagnosis not present

## 2018-11-30 DIAGNOSIS — Z03818 Encounter for observation for suspected exposure to other biological agents ruled out: Secondary | ICD-10-CM | POA: Diagnosis not present

## 2018-11-30 DIAGNOSIS — Z91040 Latex allergy status: Secondary | ICD-10-CM

## 2018-11-30 DIAGNOSIS — Z91041 Radiographic dye allergy status: Secondary | ICD-10-CM

## 2018-11-30 DIAGNOSIS — R109 Unspecified abdominal pain: Secondary | ICD-10-CM

## 2018-11-30 LAB — COMPREHENSIVE METABOLIC PANEL
ALT: 20 U/L (ref 0–44)
AST: 16 U/L (ref 15–41)
Albumin: 3.4 g/dL — ABNORMAL LOW (ref 3.5–5.0)
Alkaline Phosphatase: 88 U/L (ref 38–126)
Anion gap: 11 (ref 5–15)
BUN: 28 mg/dL — ABNORMAL HIGH (ref 8–23)
CO2: 18 mmol/L — ABNORMAL LOW (ref 22–32)
Calcium: 9.1 mg/dL (ref 8.9–10.3)
Chloride: 108 mmol/L (ref 98–111)
Creatinine, Ser: 1.17 mg/dL — ABNORMAL HIGH (ref 0.44–1.00)
GFR calc Af Amer: 58 mL/min — ABNORMAL LOW (ref >60)
GFR calc non Af Amer: 50 mL/min — ABNORMAL LOW (ref >60)
Glucose, Bld: 115 mg/dL — ABNORMAL HIGH (ref 70–99)
Potassium: 4.8 mmol/L (ref 3.5–5.1)
Sodium: 137 mmol/L (ref 135–145)
Total Bilirubin: 1.1 mg/dL (ref 0.3–1.2)
Total Protein: 6.7 g/dL (ref 6.5–8.1)

## 2018-11-30 LAB — CBC
HCT: 39.4 % (ref 36.0–46.0)
Hemoglobin: 12.3 g/dL (ref 12.0–15.0)
MCH: 27.7 pg (ref 26.0–34.0)
MCHC: 31.2 g/dL (ref 30.0–36.0)
MCV: 88.7 fL (ref 80.0–100.0)
Platelets: 220 10*3/uL (ref 150–400)
RBC: 4.44 MIL/uL (ref 3.87–5.11)
RDW: 14.6 % (ref 11.5–15.5)
WBC: 22.4 10*3/uL — ABNORMAL HIGH (ref 4.0–10.5)
nRBC: 0 % (ref 0.0–0.2)

## 2018-11-30 LAB — LIPASE, BLOOD: Lipase: 30 U/L (ref 11–51)

## 2018-11-30 LAB — LACTIC ACID, PLASMA: Lactic Acid, Venous: 0.7 mmol/L (ref 0.5–1.9)

## 2018-11-30 MED ORDER — SODIUM CHLORIDE 0.9% FLUSH
3.0000 mL | Freq: Once | INTRAVENOUS | Status: AC
  Administered 2018-12-01: 01:00:00 3 mL via INTRAVENOUS

## 2018-11-30 NOTE — ED Triage Notes (Signed)
Patient presents to Urgent Care with complaints of lower abdominal pain since last night after taking lactulose last night. Patient reports she did have a BM today but the cramping will not go away.

## 2018-11-30 NOTE — ED Provider Notes (Signed)
[facility] EMERGENCY DEPARTMENT Provider Note   CSN: [id number] Arrival date & time: 11/30/18  1813     History   Chief Complaint Chief Complaint  Patient presents with   Abdominal Pain    HPI Trinh Sanjose is a 63 y.o. female.     HPI   Sahasra Belue is a 63 y.o. 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  Pain is bilateral lower abdomen, constant pain that feels like a fullness/tightness/cramping 5/10 at rest, sharp and more intense up to 10/10 with movement or palpation, radiating throughout the abdomen.  She has not had this pain before.  Last BM was morning of 10/15 and was normal. Last ibuprofen was around 11 AM 10/15.   Denies fever/chills, N/V/C/D, chest pain, shortness of breath, hematochezia/melena, urinary symptoms, syncope, or any other complaints.    Past Medical History:  Diagnosis Date   Acute maxillary sinusitis 11/12/2015   Allergy    Anxiety state 05/17/2011   AR (allergic rhinitis) 02/17/2012   Asthma    BMI 31.0-31.9,adult 03/23/2011   Cough 03/23/2011   Dental disease 02/17/2012   [place] 01/2012    Essential hypertension, benign 03/23/2011   GERD (gastroesophageal reflux disease) 05/17/2011   H/O varicella    History of measles, mumps, or rubella    Hyperlipemia 03/23/2011   Hyperlipidemia    Hypertension    Hypertensive disorder 09/30/2016   Insomnia 05/17/2011   Mid back pain 02/17/2012   Neck pain 02/17/2012   Obesity 09/30/2016   Pelvic floor relaxation 09/30/2016   RAD (reactive airway disease) 02/17/2012    Patient Active Problem List   Diagnosis Date Noted   Perforated sigmoid colon (HCC) 12/01/2018   Sepsis (HCC) 12/01/2018   Asthma 12/01/2018   Anxiety 12/01/2018   CKD (chronic kidney disease), 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   Prediabetes 04/21/2018   H/O  hypokalemia 04/21/2018   Therapeutic drug monitoring 09/21/2017   Class 1 obesity due to excess calories with serious comorbidity and body mass index (BMI) of 31.0 to 31.9 in adult 09/30/2016   Mid back pain 02/17/2012   Chronic neck pain 02/17/2012   AR (allergic rhinitis) 02/17/2012   GERD (gastroesophageal reflux disease) 05/17/2011   Anxiety state 05/17/2011   Insomnia 05/17/2011   Essential hypertension 03/23/2011   Hyperlipemia 03/23/2011    Past Surgical History:  Procedure Laterality Date   TUBAL LIGATION     VAGINAL HYSTERECTOMY     uterine prolapse; DUB; ovaries intact.  [facility]     OB History    Gravida  4   Para  3   Term  3   Preterm  0   AB  1   Living  3     SAB  0   TAB  0   Ectopic  0   Multiple  0   Live Births               Home Medications    Prior to Admission medications   Medication Sig Start Date End Date Taking? Authorizing Provider  albuterol (PROVENTIL HFA) 108 (90 Base) MCG/ACT inhaler INHALE 2 PUFFS INTO THE LUNGS EVERY 6 HOURS AS NEEDED FOR WHEEZING OR SHORTNESS OF BREATH Patient taking differently: Inhale 2 puffs into the lungs every 6 (six) hours as needed for wheezing or shortness of breath.  04/21/18  Yes Leretha Pol,  Meda CoffeeIrma M, MD  aMILoride (MIDAMOR) 5 MG tablet Take 1 tablet (5 mg total) by mouth daily. 04/21/18  Yes Myles LippsSantiago, Irma M, MD  aspirin 81 MG tablet Take 81 mg by mouth daily.   Yes [provider]  carvedilol (COREG) 12.5 MG tablet Take 1 tablet (12.5 mg total) by mouth 2 (two) times daily. 11/17/18  Yes Baldo DaubMunley, Brian J, MD  cloNIDine (CATAPRES) 0.1 MG tablet Take 1 tablet (0.1 mg total) by mouth at bedtime. 11/17/18  Yes Baldo DaubMunley, Brian J, MD  diazepam (VALIUM) 10 MG tablet TAKE 1 TABLET BY MOUTH EVERY DAY AS NEEDED FOR MUSCLE SPASM Patient taking differently: Take 10 mg by mouth daily as needed (for muscle spams).  09/13/18  Yes Myles LippsSantiago, Irma M, MD  diltiazem (CARDIZEM CD) 120 MG 24 hr capsule Take 1  capsule (120 mg total) by mouth every morning. Patient taking differently: Take 120 mg by mouth daily.  11/17/18  Yes Baldo DaubMunley, Brian J, MD  diltiazem (CARDIZEM CD) 240 MG 24 hr capsule 1 CAPSULE IN EVENING Patient taking differently: Take 240 mg by mouth daily.  11/17/18  Yes Baldo DaubMunley, Brian J, MD  famotidine (PEPCID) 40 MG tablet Take 1 tablet (40 mg total) by mouth daily. 04/21/18  Yes Myles LippsSantiago, Irma M, MD  fluticasone (FLONASE) 50 MCG/ACT nasal spray PLACE 2 SPRAYS IN THE NOSTRIL EVERY DAY Patient taking differently: Place 2 sprays into both nostrils daily.  04/21/18  Yes Myles LippsSantiago, Irma M, MD  hydrALAZINE (APRESOLINE) 25 MG tablet Take 1 tablet (25 mg total) by mouth 3 (three) times daily. 11/17/18  Yes Baldo DaubMunley, Brian J, MD  ibuprofen (ADVIL) 800 MG tablet TAKE 1 TABLET BY MOUTH TWICE A DAY AS NEEDED Patient taking differently: Take 800 mg by mouth 2 (two) times daily as needed for mild pain.  11/23/18  Yes Myles LippsSantiago, Irma M, MD  lactulose (CHRONULAC) 10 GM/15ML solution Take 15 mLs (10 g total) by mouth 3 (three) times daily. 04/21/18  Yes Myles LippsSantiago, Irma M, MD  latanoprost (XALATAN) 0.005 % ophthalmic solution PLACE 1 DROP INTO BOTH EYES TWICE A DAY Patient taking differently: Place 1 drop into both eyes 2 (two) times daily.  04/20/15  Yes Tonye Pearsonoolittle, Robert P, MD  montelukast (SINGULAIR) 10 MG tablet Take 1 tablet (10 mg total) by mouth at bedtime. 04/21/18  Yes Myles LippsSantiago, Irma M, MD  omeprazole (PRILOSEC) 40 MG capsule Take 1 capsule (40 mg total) by mouth daily. 04/21/18  Yes Myles LippsSantiago, Irma M, MD  potassium chloride (KLOR-CON 10) 10 MEQ tablet TAKE 3 TABLETS BY MOUTH EVERY DAY Patient taking differently: Take 30 mEq by mouth daily.  11/17/18  Yes Baldo DaubMunley, Brian J, MD  rosuvastatin (CRESTOR) 20 MG tablet 1 tablet daily Patient taking differently: Take 20 mg by mouth daily.  04/21/18  Yes Myles LippsSantiago, Irma M, MD  traMADol (ULTRAM) 50 MG tablet TAKE 1 TABLET BY MOUTH EVERY 12 HOURS AS NEEDED Patient taking differently:  Take 50 mg by mouth every 12 (twelve) hours as needed for moderate pain.  09/13/18  Yes Myles LippsSantiago, Irma M, MD  zinc sulfate 220 MG capsule Take 1 capsule (220 mg total) by mouth daily. 02/17/15  Yes Tonye Pearsonoolittle, Robert P, MD    Family History Family History  Problem Relation Age of Onset   Hypertension Mother    Stroke Mother    Hypertension Brother    Stroke Brother    Cancer Maternal Grandmother        ovarian   Hypertension Sister  Stroke Sister 69       CVA    Social History Social History   Tobacco Use   Smoking status: Never Smoker   Smokeless tobacco: Never Used  Substance Use Topics   Alcohol use: No    Alcohol/week: 0.0 standard drinks   Drug use: No     Allergies   Ace inhibitors, Iodinated diagnostic agents, Lisinopril, Sulfa antibiotics, Bactrim, Flexeril [cyclobenzaprine hcl], Latex, and Naprosyn [naproxen]   Review of Systems Review of Systems  Constitutional: Negative for chills, diaphoresis and fever.  Respiratory: Negative for cough and shortness of breath.   Cardiovascular: Negative for chest pain.  Gastrointestinal: Positive for abdominal pain. Negative for blood in stool, constipation, diarrhea, nausea and vomiting.  Genitourinary: Negative for dysuria, flank pain and hematuria.  Musculoskeletal: Negative for back pain.  Neurological: Negative for syncope.  All other systems reviewed and are negative.    Physical Exam Updated Vital Signs BP 110/62 (BP Location: Right Arm)    Pulse 80    Temp 99.4 F (37.4 C) (Oral)    Resp 16    Ht  (1.499 m)    Wt 68.9 kg    SpO2 95%    BMI 30.70 kg/m   Physical Exam Vitals signs and nursing note reviewed.  Constitutional:      General: She is in acute distress (pain).     Appearance: She is well-developed. She is not diaphoretic.  HENT:     Head: Normocephalic and atraumatic.     Mouth/Throat:     Mouth: Mucous membranes are moist.     Pharynx: Oropharynx is clear.  Eyes:      Conjunctiva/sclera: Conjunctivae normal.  Neck:     Musculoskeletal: Neck supple.  Cardiovascular:     Rate and Rhythm: Normal rate and regular rhythm.     Pulses: Normal pulses.          Radial pulses are 2+ on the right side and 2+ on the left side.       Posterior tibial pulses are 2+ on the right side and 2+ on the left side.     Heart sounds: Normal heart sounds.     Comments: Tactile temperature in the extremities appropriate and equal bilaterally. Pulmonary:     Effort: Pulmonary effort is normal. No respiratory distress.     Breath sounds: Normal breath sounds.  Abdominal:     General: Bowel sounds are normal.     Palpations: Abdomen is soft.     Tenderness: There is generalized abdominal tenderness. There is no guarding.  Musculoskeletal:     Right lower leg: No edema.     Left lower leg: No edema.  Lymphadenopathy:     Cervical: No cervical adenopathy.  Skin:    General: Skin is warm and dry.  Neurological:     Mental Status: She is alert.  Psychiatric:        Mood and Affect: Mood and affect normal.        Speech: Speech normal.        Behavior: Behavior normal.      ED Treatments / Results  Labs (all labs ordered are listed, but only abnormal results are displayed) Labs Reviewed  COMPREHENSIVE METABOLIC PANEL - Abnormal; Notable for the following components:      Result Value   CO2 18 (*)    Glucose, Bld 115 (*)    BUN 28 (*)    Creatinine, Ser 1.17 (*)    Albumin  3.4 (*)    GFR calc non Af Amer 50 (*)    GFR calc Af Amer 58 (*)    All other components within normal limits  CBC - Abnormal; Notable for the following components:   WBC 22.4 (*)    All other components within normal limits  URINALYSIS, ROUTINE W REFLEX MICROSCOPIC - Abnormal; Notable for the following components:   Color, Urine AMBER (*)    APPearance HAZY (*)    Protein, ur 100 (*)    Bacteria, UA RARE (*)    All other components within normal limits  SARS CORONAVIRUS 2 (TAT 6-24 HRS)   LIPASE, BLOOD  LACTIC ACID, PLASMA  LACTIC ACID, PLASMA  PROTIME-INR  APTT  TYPE AND SCREEN    EKG None  Radiology Ct Abdomen Pelvis Wo Contrast  Addendum Date: 12/01/2018   ADDENDUM REPORT: 12/01/2018 03:01 ADDENDUM: Study discussed by telephone with PA Artist Bloom on 12/01/2018 at 0247 hours. Electronically Signed   By: Odessa Fleming M.D.   On: 12/01/2018 03:01   Result Date: 12/01/2018 CLINICAL DATA:  63 year old female with lower abdominal pain. EXAM: CT ABDOMEN AND PELVIS WITHOUT CONTRAST TECHNIQUE: Multidetector CT imaging of the abdomen and pelvis was performed following the standard protocol without IV contrast. COMPARISON:  Abdomen ultrasound 11/17/2009. FINDINGS: Lower chest: Mild cardiomegaly. No pericardial effusion. Right middle lobe bronchiectasis. Bilateral lower lobe streaky peribronchial opacity which most resembles atelectasis or scarring. No pleural effusion. Hepatobiliary: Trace adjacent pneumoperitoneum. Negative noncontrast liver and gallbladder. Pancreas: Negative. Spleen: Chronic splenic cysts which were demonstrated by ultrasound in 2011, benign. Simple fluid density on CT today. Adrenals/Urinary Tract: Negative adrenal glands. Negative noncontrast kidneys. No dilated ureters. There is inflammation along the distal course of the left ureter as below. Stomach/Bowel: Fluid-filled rectum and distal sigmoid colon. Confluent inflammation affecting the proximal sigmoid in an area of about 5 centimeters with underlying diverticulosis. Confluent mesenteric stranding (series 3, image 66 and coronal image 58). And thickening of the adjacent left lateral pelvic fossa. Furthermore, there is at least trace extraluminal gas demonstrated on series 3, image 68 and coronal image 57, plus a nearby 15 millimeter gas and intermediate density area which might also be a small contained perforation (coronal image 55). And additionally, there is trace pneumoperitoneum along the anterior superior liver  seen on series 3, image 12 and coronal image 45. No free fluid. Fluid throughout the upstream descending colon which also demonstrates diverticulosis. Fluid in the transverse and right colon also with diverticula. Oral contrast was also administered and has just reached the cecum. Upper limits of normal appendix suspected on series 3, image 51. Some of the small bowel in the lower abdomen also appears mildly inflamed (series 3, image 62). And small bowel is mildly dilated throughout the abdomen. The stomach and duodenum have a more normal appearance. Vascular/Lymphatic: Vascular patency is not evaluated in the absence of IV contrast. Minimal calcified aortic atherosclerosis. No lymphadenopathy. Reproductive: Surgically absent uterus. Diminutive or absent ovaries. Other: No pelvic free fluid. Musculoskeletal: No acute osseous abnormality identified. IMPRESSION: 1. Perforated acute diverticulitis of the proximal sigmoid colon. There is both a small area of contained perforation along the anterior inferior sigmoid mesentery (series 3, image 68), but also trace pneumoperitoneum over the liver. 2. Secondary appearing inflammation of distal small bowel, with a mild generalized ileus or bowel obstruction. 3. Non-acute findings: Mild cardiomegaly. Right middle lobe bronchiectasis and bilateral lung base atelectasis or scarring. Chronic benign splenic cysts. Electronically Signed: By: Rexene Edison  Margo Aye M.D. On: 12/01/2018 02:46    Procedures .Critical Care Performed by: Anselm Pancoast, PA-C Authorized by: Anselm Pancoast, PA-C   Critical care provider statement:    Critical care time (minutes):  35   Critical care time was exclusive of:  Separately billable procedures and treating other patients   Critical care was necessary to treat or prevent imminent or life-threatening deterioration of the following conditions: Perforated diverticulitis.   Critical care was time spent personally by me on the following activities:  Ordering  and performing treatments and interventions, ordering and review of laboratory studies, ordering and review of radiographic studies, pulse oximetry, re-evaluation of patient's condition, obtaining history from patient or surrogate, evaluation of patient's response to treatment, discussions with consultants, development of treatment plan with patient or surrogate and examination of patient   I assumed direction of critical care for this patient from another provider in my specialty: no     (including critical care time)  Medications Ordered in ED Medications  piperacillin-tazobactam (ZOSYN) IVPB 3.375 g (3.375 g Intravenous New Bag/Given 12/01/18 0328)  HYDROmorphone (DILAUDID) injection 1 mg (has no administration in time range)  famotidine (PEPCID) IVPB 20 mg premix (has no administration in time range)  labetalol (NORMODYNE) injection 5 mg (has no administration in time range)  LORazepam (ATIVAN) injection 0.5 mg (has no administration in time range)  0.9 %  sodium chloride infusion (has no administration in time range)  piperacillin-tazobactam (ZOSYN) IVPB 3.375 g (has no administration in time range)  sodium chloride flush (NS) 0.9 % injection 3 mL (3 mLs Intravenous Given 12/01/18 0118)  sodium chloride 0.9 % bolus 1,000 mL (0 mLs Intravenous Stopped 12/01/18 0245)  traMADol (ULTRAM) tablet 50 mg (50 mg Oral Given 12/01/18 0057)  ondansetron (ZOFRAN) injection 4 mg (4 mg Intravenous Given 12/01/18 0254)  morphine 4 MG/ML injection 4 mg (4 mg Intravenous Given 12/01/18 0254)  HYDROmorphone (DILAUDID) injection 1 mg (1 mg Intravenous Given 12/01/18 0327)     Initial Impression / Assessment and Plan / ED Course  I have reviewed the triage vital signs and the nursing notes.  Pertinent labs & imaging results that were available during my care of the patient were reviewed by me and considered in my medical decision making (see chart for details).  Clinical Course as of Nov 30 352  Fri  Dec 01, 2018  0020 I discussed pain management options with patient. She was quite adamant about avoiding IV narcotics, like morphine, unless absolutely necessary. We agreed upon her home tramadol.   [SJ]  0137 Patient reassessed.  States pain has not improved.  Continues to decline additional analgesia.   [SJ]  [id number] Spoke with Dr. Clyde Lundborg, hospitalist. Agrees to admit the patient.    [SJ]  [id number] Spoke with Dr. Andrey Campanile, General Surgeon. States he will come see the patient in a little while.    [SJ]    Clinical Course User Index [SJ] Perry Brucato C, PA-C       Patient presents with abdominal pain for the past couple days. Patient is nontoxic appearing, afebrile, not tachycardic, not tachypneic, not hypotensive, maintains excellent SPO2 on room air. Notable leukocytosis.  Global abdominal tenderness. Increase in patient's creatinine and BUN as well as decrease in CO2 may be connected with her poor oral intake over the last couple days. Diverticulitis with perforation noted on CT.  Antibiotics initiated. Admitted via hospitalist service with general surgery consultation.  Findings and plan of care discussed with Zadie Rhine,  MD. Dr. Christy Gentles personally evaluated and examined this patient.   Vitals:   12/01/18 0200 12/01/18 0245 12/01/18 0300 12/01/18 0345  BP: (!) 127/59 (!) 122/54 127/62 123/60  Pulse: 78 80 79 88  Resp: 17 (!) 22 (!) 25 (!) 24  Temp:      TempSrc:      SpO2: 95% 96%  98%  Weight:      Height:         Final Clinical Impressions(s) / ED Diagnoses   Final diagnoses:  Diverticulitis of large intestine with perforation, unspecified bleeding status    ED Discharge Orders    None       Layla Maw 12/01/18 0356    Ripley Fraise, MD 12/01/18 [phone]

## 2018-11-30 NOTE — ED Triage Notes (Signed)
Pt reports lower constant abd pain/cramping that started last night. Pt sent from urgent care for CT scan r/o peritonitis. Pain 7/10.

## 2018-11-30 NOTE — ED Notes (Signed)
Pt given warm blanket.  Updated on wait for treatment room.

## 2018-11-30 NOTE — ED Notes (Signed)
Updated pt on wait for treatment room. 

## 2018-11-30 NOTE — ED Notes (Signed)
Updated on wait for treatment room. 

## 2018-11-30 NOTE — ED Notes (Signed)
Updated on wait again.

## 2018-11-30 NOTE — Discharge Instructions (Signed)
Unfortunately I am not able to rule out peritonitis which is her primary concern.  I recommend you go to the emergency room to try and obtain a CT scan.

## 2018-11-30 NOTE — ED Provider Notes (Signed)
MRN: [id number] DOB: 04/20/1955  Subjective:   Brooke Wilson is a 63 y.o. female presenting for 1 day history of acute onset worsening abdominal pain throughout, has a constant cramping type sensation. Patient takes lactulose regularly for chronic constipation. Sx started after she took her usual dose last night. She currently rates the pain 7/10 but at its worst is 10/10. Patient takes Motrin regularly, Tramadol but still has pain. She has had a bowel movement today. 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   No current facility-administered medications for this encounter.   Current Outpatient Medications:  .  albuterol (PROVENTIL HFA) 108 (90 Base) MCG/ACT inhaler, INHALE 2 PUFFS INTO THE LUNGS EVERY 6 HOURS AS NEEDED FOR WHEEZING OR SHORTNESS OF BREATH, Disp: 6.7 each, Rfl: 5 .  aMILoride (MIDAMOR) 5 MG tablet, Take 1 tablet (5 mg total) by mouth daily., Disp: 90 tablet, Rfl: 3 .  aspirin 81 MG tablet, Take 81 mg by mouth daily., Disp: , Rfl:  .  carvedilol (COREG) 12.5 MG tablet, Take 1 tablet (12.5 mg total) by mouth 2 (two) times daily., Disp: 180 tablet, Rfl: 1 .  cloNIDine (CATAPRES) 0.1 MG tablet, Take 1 tablet (0.1 mg total) by mouth at bedtime., Disp: 90 tablet, Rfl: 1 .  diazepam (VALIUM) 10 MG tablet, TAKE 1 TABLET BY MOUTH EVERY DAY AS NEEDED FOR MUSCLE SPASM, Disp: 30 tablet, Rfl: 1 .  diltiazem (CARDIZEM CD) 120 MG 24 hr capsule, Take 1 capsule (120 mg total) by mouth every morning., Disp: 90 capsule, Rfl: 1 .  diltiazem (CARDIZEM CD) 240 MG 24 hr capsule, 1 CAPSULE IN EVENING, Disp: 90 capsule, Rfl: 1 .  famotidine (PEPCID) 40 MG tablet, Take 1 tablet (40 mg total) by mouth daily., Disp: 90 tablet, Rfl: 3 .  fluticasone (FLONASE) 50 MCG/ACT nasal spray, PLACE 2 SPRAYS IN THE NOSTRIL EVERY DAY, Disp: 48 g, Rfl: 3 .  hydrALAZINE (APRESOLINE) 25 MG tablet, Take 1 tablet (25 mg total) by mouth 3 (three) times daily., Disp: 270 tablet,  Rfl: 1 .  ibuprofen (ADVIL) 800 MG tablet, TAKE 1 TABLET BY MOUTH TWICE A DAY AS NEEDED, Disp: 180 tablet, Rfl: 1 .  lactulose (CHRONULAC) 10 GM/15ML solution, Take 15 mLs (10 g total) by mouth 3 (three) times daily., Disp: 240 mL, Rfl: 1 .  latanoprost (XALATAN) 0.005 % ophthalmic solution, PLACE 1 DROP INTO BOTH EYES TWICE A DAY, Disp: 15 mL, Rfl: 2 .  montelukast (SINGULAIR) 10 MG tablet, Take 1 tablet (10 mg total) by mouth at bedtime., Disp: 90 tablet, Rfl: 3 .  omeprazole (PRILOSEC) 40 MG capsule, Take 1 capsule (40 mg total) by mouth daily., Disp: 90 capsule, Rfl: 3 .  potassium chloride (KLOR-CON 10) 10 MEQ tablet, TAKE 3 TABLETS BY MOUTH EVERY DAY, Disp: 270 tablet, Rfl: 1 .  rosuvastatin (CRESTOR) 20 MG tablet, 1 tablet daily, Disp: 90 tablet, Rfl: 3 .  traMADol (ULTRAM) 50 MG tablet, TAKE 1 TABLET BY MOUTH EVERY 12 HOURS AS NEEDED, Disp: 60 tablet, Rfl: 1 .  zinc sulfate 220 MG capsule, Take 1 capsule (220 mg total) by mouth daily., Disp: 90 capsule, Rfl: 3   Allergies  Allergen Reactions  . Ace Inhibitors Cough  . Iodinated Diagnostic Agents   . Lisinopril Cough  . Sulfa Antibiotics   . Bactrim Rash  . Flexeril [Cyclobenzaprine Hcl] Nausea Only  . Latex Itching  . Naprosyn [Naproxen] Nausea And Vomiting    And high doses  of NSAIDS    Past Medical History:  Diagnosis Date  . Acute maxillary sinusitis 11/12/2015  . Allergy   . Anxiety state 05/17/2011  . AR (allergic rhinitis) 02/17/2012  . Asthma   . BMI 31.0-31.9,adult 03/23/2011  . Cough 03/23/2011  . Dental disease 02/17/2012   [place] in [place] 01/2012   . Essential hypertension, benign 03/23/2011  . GERD (gastroesophageal reflux disease) 05/17/2011  . H/O varicella   . History of measles, mumps, or rubella   . Hyperlipemia 03/23/2011  . Hyperlipidemia   . Hypertension   . Hypertensive disorder 09/30/2016  . Insomnia 05/17/2011  . Mid back pain 02/17/2012  . Neck pain 02/17/2012  . Obesity 09/30/2016  . Pelvic floor  relaxation 09/30/2016  . RAD (reactive airway disease) 02/17/2012     Past Surgical History:  Procedure Laterality Date  . TUBAL LIGATION    . VAGINAL HYSTERECTOMY     uterine prolapse; DUB; ovaries intact.  [facility]    ROS  Objective:   Vitals: BP 109/62 (BP Location: Right Arm)   Pulse 80   Temp 100.1 F (37.8 C) (Oral)   Resp 18   SpO2 99%   Physical Exam Constitutional:      General: She is in acute distress.     Appearance: Normal appearance. She is well-developed. She is obese. She is not ill-appearing, toxic-appearing or diaphoretic.  HENT:     Head: Normocephalic and atraumatic.     Nose: Nose normal.     Mouth/Throat:     Mouth: Mucous membranes are moist.     Pharynx: Oropharynx is clear.  Eyes:     General: No scleral icterus.    Extraocular Movements: Extraocular movements intact.     Pupils: Pupils are equal, round, and reactive to light.  Cardiovascular:     Rate and Rhythm: Normal rate.  Pulmonary:     Effort: Pulmonary effort is normal.  Abdominal:     Tenderness: There is abdominal tenderness in the right upper quadrant, right lower quadrant, epigastric area, left upper quadrant and left lower quadrant. There is guarding.  Skin:    General: Skin is warm and dry.  Neurological:     General: No focal deficit present.     Mental Status: She is alert and oriented to person, place, and time.  Psychiatric:        Mood and Affect: Mood normal.        Behavior: Behavior normal.      Assessment and Plan :   1. Generalized abdominal pain   2. Continuous severe abdominal pain     I discussed differential for abdominal pain including my suspicion that she could have adverse effects from regular use of lactulose, ibuprofen and tramadol.  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  I offered an abdominal x-ray discussing the limitations of this imaging test and patient  refused.  She states that she prefers to go to the emergency room to try and obtain a CT scan to rule out peritonitis.   Wallis Bamberg, [place] 11/30/18 1738

## 2018-12-01 ENCOUNTER — Encounter (HOSPITAL_COMMUNITY): Payer: Self-pay | Admitting: Radiology

## 2018-12-01 ENCOUNTER — Emergency Department (HOSPITAL_COMMUNITY): Payer: Federal, State, Local not specified - PPO

## 2018-12-01 DIAGNOSIS — F419 Anxiety disorder, unspecified: Secondary | ICD-10-CM | POA: Diagnosis not present

## 2018-12-01 DIAGNOSIS — Z888 Allergy status to other drugs, medicaments and biological substances status: Secondary | ICD-10-CM | POA: Diagnosis not present

## 2018-12-01 DIAGNOSIS — J45909 Unspecified asthma, uncomplicated: Secondary | ICD-10-CM | POA: Diagnosis present

## 2018-12-01 DIAGNOSIS — A419 Sepsis, unspecified organism: Secondary | ICD-10-CM | POA: Diagnosis not present

## 2018-12-01 DIAGNOSIS — K5720 Diverticulitis of large intestine with perforation and abscess without bleeding: Secondary | ICD-10-CM | POA: Diagnosis not present

## 2018-12-01 DIAGNOSIS — N182 Chronic kidney disease, stage 2 (mild): Secondary | ICD-10-CM | POA: Diagnosis not present

## 2018-12-01 DIAGNOSIS — Z882 Allergy status to sulfonamides status: Secondary | ICD-10-CM | POA: Diagnosis not present

## 2018-12-01 DIAGNOSIS — Z20828 Contact with and (suspected) exposure to other viral communicable diseases: Secondary | ICD-10-CM | POA: Diagnosis present

## 2018-12-01 DIAGNOSIS — M542 Cervicalgia: Secondary | ICD-10-CM | POA: Diagnosis present

## 2018-12-01 DIAGNOSIS — K631 Perforation of intestine (nontraumatic): Secondary | ICD-10-CM | POA: Diagnosis not present

## 2018-12-01 DIAGNOSIS — N179 Acute kidney failure, unspecified: Secondary | ICD-10-CM | POA: Diagnosis present

## 2018-12-01 DIAGNOSIS — K219 Gastro-esophageal reflux disease without esophagitis: Secondary | ICD-10-CM | POA: Diagnosis present

## 2018-12-01 DIAGNOSIS — M549 Dorsalgia, unspecified: Secondary | ICD-10-CM | POA: Diagnosis present

## 2018-12-01 DIAGNOSIS — I10 Essential (primary) hypertension: Secondary | ICD-10-CM

## 2018-12-01 DIAGNOSIS — G8929 Other chronic pain: Secondary | ICD-10-CM | POA: Diagnosis present

## 2018-12-01 DIAGNOSIS — J4520 Mild intermittent asthma, uncomplicated: Secondary | ICD-10-CM | POA: Diagnosis not present

## 2018-12-01 DIAGNOSIS — F411 Generalized anxiety disorder: Secondary | ICD-10-CM | POA: Diagnosis present

## 2018-12-01 DIAGNOSIS — I1310 Hypertensive heart and chronic kidney disease without heart failure, with stage 1 through stage 4 chronic kidney disease, or unspecified chronic kidney disease: Secondary | ICD-10-CM | POA: Diagnosis present

## 2018-12-01 DIAGNOSIS — H5789 Other specified disorders of eye and adnexa: Secondary | ICD-10-CM | POA: Diagnosis present

## 2018-12-01 DIAGNOSIS — K56699 Other intestinal obstruction unspecified as to partial versus complete obstruction: Secondary | ICD-10-CM | POA: Diagnosis present

## 2018-12-01 DIAGNOSIS — E785 Hyperlipidemia, unspecified: Secondary | ICD-10-CM | POA: Diagnosis present

## 2018-12-01 DIAGNOSIS — Z6830 Body mass index (BMI) 30.0-30.9, adult: Secondary | ICD-10-CM | POA: Diagnosis not present

## 2018-12-01 DIAGNOSIS — Z91041 Radiographic dye allergy status: Secondary | ICD-10-CM | POA: Diagnosis not present

## 2018-12-01 DIAGNOSIS — R7303 Prediabetes: Secondary | ICD-10-CM | POA: Diagnosis not present

## 2018-12-01 DIAGNOSIS — E669 Obesity, unspecified: Secondary | ICD-10-CM | POA: Diagnosis present

## 2018-12-01 DIAGNOSIS — Z886 Allergy status to analgesic agent status: Secondary | ICD-10-CM | POA: Diagnosis not present

## 2018-12-01 DIAGNOSIS — E876 Hypokalemia: Secondary | ICD-10-CM | POA: Diagnosis present

## 2018-12-01 HISTORY — DX: Perforation of intestine (nontraumatic): K63.1

## 2018-12-01 HISTORY — DX: Anxiety disorder, unspecified: F41.9

## 2018-12-01 HISTORY — DX: Chronic kidney disease, stage 2 (mild): N18.2

## 2018-12-01 HISTORY — DX: Sepsis, unspecified organism: A41.9

## 2018-12-01 LAB — CBC
HCT: 39.2 % (ref 36.0–46.0)
Hemoglobin: 12.1 g/dL (ref 12.0–15.0)
MCH: 27.8 pg (ref 26.0–34.0)
MCHC: 30.9 g/dL (ref 30.0–36.0)
MCV: 90.1 fL (ref 80.0–100.0)
Platelets: 212 10*3/uL (ref 150–400)
RBC: 4.35 MIL/uL (ref 3.87–5.11)
RDW: 14.6 % (ref 11.5–15.5)
WBC: 20.8 10*3/uL — ABNORMAL HIGH (ref 4.0–10.5)
nRBC: 0 % (ref 0.0–0.2)

## 2018-12-01 LAB — URINALYSIS, ROUTINE W REFLEX MICROSCOPIC
Bilirubin Urine: NEGATIVE
Glucose, UA: NEGATIVE mg/dL
Hgb urine dipstick: NEGATIVE
Ketones, ur: NEGATIVE mg/dL
Leukocytes,Ua: NEGATIVE
Nitrite: NEGATIVE
Protein, ur: 100 mg/dL — AB
Specific Gravity, Urine: 1.028 (ref 1.005–1.030)
pH: 5 (ref 5.0–8.0)

## 2018-12-01 LAB — BASIC METABOLIC PANEL
Anion gap: 13 (ref 5–15)
BUN: 23 mg/dL (ref 8–23)
CO2: 18 mmol/L — ABNORMAL LOW (ref 22–32)
Calcium: 8.7 mg/dL — ABNORMAL LOW (ref 8.9–10.3)
Chloride: 107 mmol/L (ref 98–111)
Creatinine, Ser: 1.02 mg/dL — ABNORMAL HIGH (ref 0.44–1.00)
GFR calc Af Amer: >60 mL/min (ref >60)
GFR calc non Af Amer: 59 mL/min — ABNORMAL LOW (ref >60)
Glucose, Bld: 121 mg/dL — ABNORMAL HIGH (ref 70–99)
Potassium: 4.3 mmol/L (ref 3.5–5.1)
Sodium: 138 mmol/L (ref 135–145)

## 2018-12-01 LAB — SARS CORONAVIRUS 2 (TAT 6-24 HRS): SARS Coronavirus 2: NEGATIVE

## 2018-12-01 LAB — PROTIME-INR
INR: 1.2 (ref 0.8–1.2)
Prothrombin Time: 14.7 seconds (ref 11.4–15.2)

## 2018-12-01 LAB — LACTIC ACID, PLASMA: Lactic Acid, Venous: 1.1 mmol/L (ref 0.5–1.9)

## 2018-12-01 LAB — TYPE AND SCREEN
ABO/RH(D): O POS
Antibody Screen: NEGATIVE

## 2018-12-01 LAB — ABO/RH: ABO/RH(D): O POS

## 2018-12-01 LAB — APTT: aPTT: 32 seconds (ref 24–36)

## 2018-12-01 LAB — HIV ANTIBODY (ROUTINE TESTING W REFLEX): HIV Screen 4th Generation wRfx: NONREACTIVE

## 2018-12-01 LAB — PROCALCITONIN: Procalcitonin: 7.26 ng/mL

## 2018-12-01 LAB — CBG MONITORING, ED: Glucose-Capillary: 105 mg/dL — ABNORMAL HIGH (ref 70–99)

## 2018-12-01 MED ORDER — MONTELUKAST SODIUM 10 MG PO TABS: 10.0000 mg | ORAL_TABLET | Freq: Every day | ORAL | Status: DC

## 2018-12-01 MED ORDER — ALBUTEROL SULFATE HFA 108 (90 BASE) MCG/ACT IN AERS
2.0000 | INHALATION_SPRAY | RESPIRATORY_TRACT | Status: DC | PRN
  Filled 2018-12-01: qty 6.7

## 2018-12-01 MED ORDER — CLONIDINE HCL 0.1 MG/24HR TD PTWK
0.1000 mg | MEDICATED_PATCH | TRANSDERMAL | Status: DC
  Filled 2018-12-01: qty 1

## 2018-12-01 MED ORDER — CARVEDILOL 12.5 MG PO TABS
12.5000 mg | ORAL_TABLET | Freq: Two times a day (BID) | ORAL | Status: DC
  Administered 2018-12-01 – 2018-12-02 (×2): 12.5 mg via ORAL
  Filled 2018-12-01 (×2): qty 1

## 2018-12-01 MED ORDER — DILTIAZEM HCL ER COATED BEADS 120 MG PO CP24
120.0000 mg | ORAL_CAPSULE | Freq: Every day | ORAL | Status: DC
  Administered 2018-12-01 – 2018-12-02 (×2): 120 mg via ORAL
  Filled 2018-12-01 (×2): qty 1

## 2018-12-01 MED ORDER — SODIUM CHLORIDE 0.9 % IV BOLUS
1000.0000 mL | Freq: Once | INTRAVENOUS | Status: AC
  Administered 2018-12-01: 1000 mL via INTRAVENOUS

## 2018-12-01 MED ORDER — PIPERACILLIN-TAZOBACTAM 3.375 G IVPB 30 MIN
3.3750 g | Freq: Once | INTRAVENOUS | Status: AC
  Administered 2018-12-01: 03:00:00 3.375 g via INTRAVENOUS
  Filled 2018-12-01: qty 50

## 2018-12-01 MED ORDER — ONDANSETRON HCL 4 MG/2ML IJ SOLN: 4.0000 mg | Freq: Four times a day (QID) | INTRAMUSCULAR | Status: DC | PRN

## 2018-12-01 MED ORDER — LATANOPROST 0.005 % OP SOLN
1.0000 [drp] | Freq: Two times a day (BID) | OPHTHALMIC | Status: DC
  Administered 2018-12-02 – 2018-12-04 (×4): 1 [drp] via OPHTHALMIC
  Filled 2018-12-01: qty 2.5

## 2018-12-01 MED ORDER — HYDRALAZINE HCL 25 MG PO TABS
25.0000 mg | ORAL_TABLET | Freq: Three times a day (TID) | ORAL | Status: DC
  Administered 2018-12-01 – 2018-12-02 (×2): 25 mg via ORAL
  Filled 2018-12-01 (×2): qty 1

## 2018-12-01 MED ORDER — HYDROMORPHONE HCL 1 MG/ML IJ SOLN
1.0000 mg | Freq: Once | INTRAMUSCULAR | Status: AC
  Administered 2018-12-01: 1 mg via INTRAVENOUS
  Filled 2018-12-01: qty 1

## 2018-12-01 MED ORDER — DIAZEPAM 5 MG PO TABS: 10.0000 mg | ORAL_TABLET | Freq: Every day | ORAL | Status: DC | PRN

## 2018-12-01 MED ORDER — ACETAMINOPHEN 325 MG PO TABS
650.0000 mg | ORAL_TABLET | Freq: Four times a day (QID) | ORAL | Status: DC | PRN
  Filled 2018-12-01: qty 2

## 2018-12-01 MED ORDER — FLUTICASONE PROPIONATE 50 MCG/ACT NA SUSP
2.0000 | Freq: Every day | NASAL | Status: DC
  Administered 2018-12-02 – 2018-12-04 (×3): 2 via NASAL
  Filled 2018-12-01: qty 16

## 2018-12-01 MED ORDER — ONDANSETRON HCL 4 MG PO TABS: 4.0000 mg | ORAL_TABLET | Freq: Four times a day (QID) | ORAL | Status: DC | PRN

## 2018-12-01 MED ORDER — HYDROMORPHONE HCL 1 MG/ML IJ SOLN
1.0000 mg | INTRAMUSCULAR | Status: DC | PRN
  Administered 2018-12-01 – 2018-12-02 (×10): 1 mg via INTRAVENOUS
  Filled 2018-12-01 (×10): qty 1

## 2018-12-01 MED ORDER — ZINC SULFATE 220 (50 ZN) MG PO CAPS
220.0000 mg | ORAL_CAPSULE | Freq: Every day | ORAL | Status: DC
  Administered 2018-12-02: 220 mg via ORAL
  Filled 2018-12-01 (×2): qty 1

## 2018-12-01 MED ORDER — ACETAMINOPHEN 650 MG RE SUPP: 650.0000 mg | Freq: Four times a day (QID) | RECTAL | Status: DC | PRN

## 2018-12-01 MED ORDER — FAMOTIDINE IN NACL 20-0.9 MG/50ML-% IV SOLN
20.0000 mg | Freq: Two times a day (BID) | INTRAVENOUS | Status: DC
  Administered 2018-12-01 (×2): 20 mg via INTRAVENOUS
  Filled 2018-12-01 (×2): qty 50

## 2018-12-01 MED ORDER — ROSUVASTATIN CALCIUM 20 MG PO TABS
20.0000 mg | ORAL_TABLET | Freq: Every day | ORAL | Status: DC
  Administered 2018-12-02: 20 mg via ORAL
  Filled 2018-12-01: qty 1

## 2018-12-01 MED ORDER — CLONIDINE HCL 0.1 MG PO TABS
0.1000 mg | ORAL_TABLET | Freq: Every day | ORAL | Status: DC
  Administered 2018-12-01: 21:00:00 0.1 mg via ORAL
  Filled 2018-12-01: qty 1

## 2018-12-01 MED ORDER — PIPERACILLIN-TAZOBACTAM 3.375 G IVPB
3.3750 g | Freq: Three times a day (TID) | INTRAVENOUS | Status: DC
  Administered 2018-12-01 – 2018-12-04 (×9): 3.375 g via INTRAVENOUS
  Filled 2018-12-01 (×11): qty 50

## 2018-12-01 MED ORDER — BARIUM SULFATE 2.1 % PO SUSP
ORAL | Status: AC
  Filled 2018-12-01: qty 2

## 2018-12-01 MED ORDER — MORPHINE SULFATE (PF) 4 MG/ML IV SOLN
4.0000 mg | Freq: Once | INTRAVENOUS | Status: AC
  Administered 2018-12-01: 03:00:00 4 mg via INTRAVENOUS
  Filled 2018-12-01: qty 1

## 2018-12-01 MED ORDER — TRAMADOL HCL 50 MG PO TABS
50.0000 mg | ORAL_TABLET | Freq: Once | ORAL | Status: AC
  Administered 2018-12-01: 50 mg via ORAL
  Filled 2018-12-01: qty 1

## 2018-12-01 MED ORDER — PANTOPRAZOLE SODIUM 40 MG PO TBEC
40.0000 mg | DELAYED_RELEASE_TABLET | Freq: Every day | ORAL | Status: DC
  Administered 2018-12-01 – 2018-12-02 (×2): 40 mg via ORAL
  Filled 2018-12-01 (×2): qty 1

## 2018-12-01 MED ORDER — LACTULOSE 10 GM/15ML PO SOLN: 10.0000 g | Freq: Three times a day (TID) | ORAL | Status: DC

## 2018-12-01 MED ORDER — ASPIRIN EC 81 MG PO TBEC
81.0000 mg | DELAYED_RELEASE_TABLET | Freq: Every day | ORAL | Status: DC
  Administered 2018-12-02: 81 mg via ORAL
  Filled 2018-12-01 (×2): qty 1

## 2018-12-01 MED ORDER — LABETALOL HCL 5 MG/ML IV SOLN: 5.0000 mg | INTRAVENOUS | Status: DC | PRN

## 2018-12-01 MED ORDER — LORAZEPAM 2 MG/ML IJ SOLN: 0.5000 mg | Freq: Three times a day (TID) | INTRAMUSCULAR | Status: DC | PRN

## 2018-12-01 MED ORDER — SODIUM CHLORIDE 0.9 % IV SOLN
INTRAVENOUS | Status: DC
  Administered 2018-12-01 – 2018-12-02 (×3): via INTRAVENOUS

## 2018-12-01 MED ORDER — ONDANSETRON HCL 4 MG/2ML IJ SOLN
4.0000 mg | Freq: Once | INTRAMUSCULAR | Status: AC
  Administered 2018-12-01: 03:00:00 4 mg via INTRAVENOUS
  Filled 2018-12-01: qty 2

## 2018-12-01 MED ORDER — FAMOTIDINE 20 MG PO TABS
40.0000 mg | ORAL_TABLET | Freq: Every day | ORAL | Status: DC
  Administered 2018-12-01 – 2018-12-04 (×4): 40 mg via ORAL
  Filled 2018-12-01 (×4): qty 2

## 2018-12-01 NOTE — ED Notes (Signed)
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

## 2018-12-01 NOTE — Progress Notes (Signed)
Pharmacy Antibiotic Note  Brooke Wilson is a 63 y.o. female admitted on 11/30/2018 with perforated diverticulitis.  Pharmacy has been consulted for Zosyn dosing.  Plan: Zosyn 3.375g IV q8h (4-hour infusion).  Height: 4\' 11"  (149.9 cm) Weight: 152 lb (68.9 kg) IBW/kg (Calculated) : 43.2  Temp (24hrs), Avg:99.8 F (37.7 C), Min:99.4 F (37.4 C), Max:100.1 F (37.8 C)  Recent Labs  Lab 11/30/18 1825  WBC 22.4*  CREATININE 1.17*  LATICACIDVEN 0.7    Estimated Creatinine Clearance: 42.1 mL/min (A) (by C-G formula based on SCr of 1.17 mg/dL (H)).    Allergies  Allergen Reactions  . Ace Inhibitors Cough  . Iodinated Diagnostic Agents Hives    Chills  . Lisinopril Cough  . Sulfa Antibiotics Hives  . Bactrim Rash  . Flexeril [Cyclobenzaprine Hcl] Nausea Only  . Latex Itching  . Naprosyn [Naproxen] Nausea And Vomiting    And high doses of NSAIDS    Thank you for allowing pharmacy to be a part of this patient's care.  Wynona Neat, PharmD, BCPS  12/01/2018 3:47 AM

## 2018-12-01 NOTE — Consult Note (Signed)
Reason for Consult: Diverticulitis Referring Physician: Harolyn Rutherford, PA-C Brooke Wilson is an 63 y.o. female.  HPI: 63 year old female with history of hypertension, GERD, hyperlipidemia, chronic neck and back pain presented to the emergency room for persistent lower abdominal pain which started on Wednesday.  The pain is constant but will worsen at times.  She states that it will spasm at times.  It is mainly in her lower abdomen.  Movement will generally worsen the pain.  She denies any prior symptoms.  She could not get any relief at home.  She denies any fever, chills, nausea or vomiting.  She does have chronic constipation and takes lactulose a few times a week but will have daily bowel movements and good bowel movements on the days that she takes lactulose.  She denies any weight loss.  She states that she had a colonoscopy about 4 years ago and was told to come back in 10 years.  She denies any melena or hematochezia.  She does not smoke.  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  Past Medical History:  Diagnosis Date   Acute maxillary sinusitis 11/12/2015   Allergy    Anxiety state 05/17/2011   AR (allergic rhinitis) 02/17/2012   Asthma    BMI 31.0-31.9,adult 03/23/2011   Cough 03/23/2011   Dental disease 02/17/2012   [place] in [place] 01/2012    Essential hypertension, benign 03/23/2011   GERD (gastroesophageal reflux disease) 05/17/2011   H/O varicella    History of measles, mumps, or rubella    Hyperlipemia 03/23/2011   Hyperlipidemia    Hypertension    Hypertensive disorder 09/30/2016   Insomnia 05/17/2011   Mid back pain 02/17/2012   Neck pain 02/17/2012   Obesity 09/30/2016   Pelvic floor relaxation 09/30/2016   RAD (reactive airway disease) 02/17/2012    Past Surgical History:  Procedure Laterality Date   TUBAL LIGATION     VAGINAL HYSTERECTOMY     uterine  prolapse; DUB; ovaries intact.  [place]    Family History  Problem Relation Age of Onset   Hypertension Mother    Stroke Mother    Hypertension Brother    Stroke Brother    Cancer Maternal Grandmother        ovarian   Hypertension Sister    Stroke Sister 31       CVA    Social History:  reports that she has never smoked. She has never used smokeless tobacco. She reports that she does not drink alcohol or use drugs.  Allergies:  Allergies  Allergen Reactions   Ace Inhibitors Cough   Iodinated Diagnostic Agents Hives    Chills   Lisinopril Cough   Sulfa Antibiotics Hives   Bactrim Rash   Flexeril [Cyclobenzaprine Hcl] Nausea Only   Latex Itching   Naprosyn [Naproxen] Nausea And Vomiting    And high doses of NSAIDS    Medications: I have reviewed the patient's current medications.  Results for orders placed or performed during the hospital encounter of 11/30/18 (from the past 48 hour(s))  Lipase, blood     Status: None   Collection Time: 11/30/18  6:25 PM  Result Value Ref Range   Lipase 30 11 - 51 U/L    Comment: Performed at [facility], [address] [place], [place] [address]  Comprehensive metabolic panel     Status: Abnormal   Collection Time:  11/30/18  6:25 PM  Result Value Ref Range   Sodium 137 135 - 145 mmol/L   Potassium 4.8 3.5 - 5.1 mmol/L   Chloride 108 98 - 111 mmol/L   CO2 18 (L) 22 - 32 mmol/L   Glucose, Bld 115 (H) 70 - 99 mg/dL   BUN 28 (H) 8 - 23 mg/dL   Creatinine, Ser 1.17 (H) 0.44 - 1.00 mg/dL   Calcium 9.1 8.9 - 10.3 mg/dL   Total Protein 6.7 6.5 - 8.1 g/dL   Albumin 3.4 (L) 3.5 - 5.0 g/dL   AST 16 15 - 41 U/L   ALT 20 0 - 44 U/L   Alkaline Phosphatase 88 38 - 126 U/L   Total Bilirubin 1.1 0.3 - 1.2 mg/dL   GFR calc non Af Amer 50 (L) >60 mL/min   GFR calc Af Amer 58 (L) >60 mL/min   Anion gap 11 5 - 15    Comment: Performed at [facility], [address], [place], [place] [address]  CBC     Status:  Abnormal   Collection Time: 11/30/18  6:25 PM  Result Value Ref Range   WBC 22.4 (H) 4.0 - 10.5 K/uL   RBC 4.44 3.87 - 5.11 MIL/uL   Hemoglobin 12.3 12.0 - 15.0 g/dL   HCT 39.4 36.0 - 46.0 %   MCV 88.7 80.0 - 100.0 fL   MCH 27.7 26.0 - 34.0 pg   MCHC 31.2 30.0 - 36.0 g/dL   RDW 14.6 11.5 - 15.5 %   Platelets 220 150 - 400 K/uL   nRBC 0.0 0.0 - 0.2 %    Comment: Performed at [facility], [place] [address], [place], [place] [address]  Lactic acid, plasma     Status: None   Collection Time: 11/30/18  6:25 PM  Result Value Ref Range   Lactic Acid, Venous 0.7 0.5 - 1.9 mmol/L    Comment: Performed at [facility] [address], [place], [place] [address]  Urinalysis, Routine w reflex microscopic     Status: Abnormal   Collection Time: 12/01/18  2:56 AM  Result Value Ref Range   Color, Urine AMBER (A) YELLOW    Comment: BIOCHEMICALS MAY BE AFFECTED BY COLOR   APPearance HAZY (A) CLEAR   Specific Gravity, Urine 1.028 1.005 - 1.030   pH 5.0 5.0 - 8.0   Glucose, UA NEGATIVE NEGATIVE mg/dL   Hgb urine dipstick NEGATIVE NEGATIVE   Bilirubin Urine NEGATIVE NEGATIVE   Ketones, ur NEGATIVE NEGATIVE mg/dL   Protein, ur 100 (A) NEGATIVE mg/dL   Nitrite NEGATIVE NEGATIVE   Leukocytes,Ua NEGATIVE NEGATIVE   RBC / HPF 0-5 0 - 5 RBC/hpf   WBC, UA 0-5 0 - 5 WBC/hpf   Bacteria, UA RARE (A) NONE SEEN   Squamous Epithelial / LPF 0-5 0 - 5   Mucus PRESENT     Comment: Performed at [facility], [address], [place], [place] [address]  Type and screen [place]     Status: None (Preliminary result)   Collection Time: 12/01/18  4:12 AM  Result Value Ref Range   ABO/RH(D) PENDING    Antibody Screen PENDING    Sample Expiration      12/04/2018,2359 Performed at [facility], [place] [address] [place], [place] [address]     Ct Abdomen Pelvis Wo Contrast  Addendum Date: 12/01/2018   ADDENDUM REPORT: 12/01/2018 03:01 ADDENDUM: Study discussed  by  telephone with PA SHAWN JOY on 12/01/2018 at 0247 hours. Electronically Signed   By: Odessa Fleming M.D.   On: 12/01/2018 03:01   Result Date: 12/01/2018 CLINICAL DATA:  63 year old female with lower abdominal pain. EXAM: CT ABDOMEN AND PELVIS WITHOUT CONTRAST TECHNIQUE: Multidetector CT imaging of the abdomen and pelvis was performed following the standard protocol without IV contrast. COMPARISON:  Abdomen ultrasound 11/17/2009. FINDINGS: Lower chest: Mild cardiomegaly. No pericardial effusion. Right middle lobe bronchiectasis. Bilateral lower lobe streaky peribronchial opacity which most resembles atelectasis or scarring. No pleural effusion. Hepatobiliary: Trace adjacent pneumoperitoneum. Negative noncontrast liver and gallbladder. Pancreas: Negative. Spleen: Chronic splenic cysts which were demonstrated by ultrasound in 2011, benign. Simple fluid density on CT today. Adrenals/Urinary Tract: Negative adrenal glands. Negative noncontrast kidneys. No dilated ureters. There is inflammation along the distal course of the left ureter as below. Stomach/Bowel: Fluid-filled rectum and distal sigmoid colon. Confluent inflammation affecting the proximal sigmoid in an area of about 5 centimeters with underlying diverticulosis. Confluent mesenteric stranding (series 3, image 66 and coronal image 58). And thickening of the adjacent left lateral pelvic fossa. Furthermore, there is at least trace extraluminal gas demonstrated on series 3, image 68 and coronal image 57, plus a nearby 15 millimeter gas and intermediate density area which might also be a small contained perforation (coronal image 55). And additionally, there is trace pneumoperitoneum along the anterior superior liver seen on series 3, image 12 and coronal image 45. No free fluid. Fluid throughout the upstream descending colon which also demonstrates diverticulosis. Fluid in the transverse and right colon also with diverticula. Oral contrast was also administered and  has just reached the cecum. Upper limits of normal appendix suspected on series 3, image 51. Some of the small bowel in the lower abdomen also appears mildly inflamed (series 3, image 62). And small bowel is mildly dilated throughout the abdomen. The stomach and duodenum have a more normal appearance. Vascular/Lymphatic: Vascular patency is not evaluated in the absence of IV contrast. Minimal calcified aortic atherosclerosis. No lymphadenopathy. Reproductive: Surgically absent uterus. Diminutive or absent ovaries. Other: No pelvic free fluid. Musculoskeletal: No acute osseous abnormality identified. IMPRESSION: 1. Perforated acute diverticulitis of the proximal sigmoid colon. There is both a small area of contained perforation along the anterior inferior sigmoid mesentery (series 3, image 68), but also trace pneumoperitoneum over the liver. 2. Secondary appearing inflammation of distal small bowel, with a mild generalized ileus or bowel obstruction. 3. Non-acute findings: Mild cardiomegaly. Right middle lobe bronchiectasis and bilateral lung base atelectasis or scarring. Chronic benign splenic cysts. Electronically Signed: By: Odessa Fleming M.D. On: 12/01/2018 02:46    Review of Systems  Constitutional: Negative for chills, fever and weight loss.  HENT: Negative for nosebleeds.   Eyes: Negative for blurred vision.  Respiratory: Negative for shortness of breath.   Cardiovascular: Negative for chest pain, palpitations, orthopnea and PND.       Denies DOE  Gastrointestinal: Positive for abdominal pain and constipation. Negative for nausea and vomiting.  Genitourinary: Negative for dysuria and hematuria.  Musculoskeletal: Negative.   Skin: Negative for itching and rash.  Neurological: Negative for dizziness, focal weakness, seizures, loss of consciousness and headaches.       Denies TIAs, amaurosis fugax  Endo/Heme/Allergies: Does not bruise/bleed easily.  Psychiatric/Behavioral: The patient is not  nervous/anxious.    Blood pressure 123/60, pulse 88, temperature 99.4 F (37.4 C), temperature source Oral, resp. rate (!) 24, height  (1.499 m), weight  68.9 kg, SpO2 98 %. Physical Exam  Vitals reviewed. Constitutional: She is oriented to person, place, and time. She appears well-developed and well-nourished. No distress.  HENT:  Head: Normocephalic and atraumatic.  Right Ear: External ear normal.  Left Ear: External ear normal.  Eyes: Conjunctivae are normal. No scleral icterus.  Neck: Normal range of motion. Neck supple. No tracheal deviation present. No thyromegaly present.  Cardiovascular: Normal rate and normal heart sounds.  Respiratory: Effort normal and breath sounds normal. No stridor. No respiratory distress. She has no wheezes.  GI: Soft. She exhibits no distension. There is abdominal tenderness in the suprapubic area and left lower quadrant. There is no rigidity, no rebound and no guarding.    TTP in lower abd but no guarding/rebound/peritonitis  Musculoskeletal:        General: No tenderness or edema.  Lymphadenopathy:    She has no cervical adenopathy.  Neurological: She is alert and oriented to person, place, and time. She exhibits normal muscle tone.  Skin: Skin is warm and dry. No rash noted. She is not diaphoretic. No erythema. No pallor.  Unclear edema - wearing compression stockings  Psychiatric: She has a normal mood and affect. Her behavior is normal. Judgment and thought content normal.    Assessment/Plan: Sigmoid diverticulitis with microperforation Hypertension Asthma GERD Hyperlipidemia Prediabetes Chronic neck and back pain  She is resting comfortably.  She is nontoxic-appearing.  She does not have peritonitis.  She does not have a rigid abdomen.  Therefore I do not think she needs urgent surgery  We will start with bowel rest and broad-spectrum IV antibiotics  Hopefully as pain improves along with improving leukocytosis & remaining afebrile  we can advance her diet over the next few days and and medically manage her diverticulitis  We discussed diverticular disease.  We discussed management of this along with the typical hospitalization.  We discussed the possibility of worsening diverticulitis and or failure to progress  Patient can have ice chips and sips of water/liquids today and oral meds if needed  Patient can have chemical DVT prophylaxis  We will follow  Mary SellaEric M. Andrey CampanileWilson, MD, FACS General, Bariatric, & Minimally Invasive Surgery [facility]  Brooke Aduric Brooke Wilson 12/01/2018, 4:53 AM

## 2018-12-01 NOTE — Plan of Care (Signed)
  Problem: Education: Goal: Knowledge of General Education information will improve Description Including pain rating scale, medication(s)/side effects and non-pharmacologic comfort measures Outcome: Progressing   

## 2018-12-01 NOTE — H&P (Signed)
History and Physical    Brooke Wilson WER:[id number] DOB: January 18, 1956 DOA: 11/30/2018  Referring MD/NP/PA:   PCP: Rutherford Guys, MD   Patient coming from:  The patient is coming from home.  At baseline, pt is independent for most of ADL.        Chief Complaint: Abdominal pain  HPI: Brooke Wilson is a 63 y.o. female with medical history significant of hypertension, hyperlipidemia, asthma, GERD, anxiety, sinusitis, CKD stage II, pelvic floor relaxation, who presents with abdominal pain.  Patient states that her abdominal pain started last night, which is located mainly in lower abdomen, also involving whole abdomen, constant, sharp, 10 out of 10 severity, nonradiating.  Patient denies nausea vomiting, diarrhea.  Patient does not have chills or subjective fever, but her temperature is 100.1 in ED.  Patient does not have chest pain, shortness of breath, cough.  No symptoms of UTI or unilateral weakness.  ED Course: pt was found to have WBC 22.4, lactic acid 0.7, lipase 30, pending COVID-19 test, pending urinalysis, slightly worsening renal function, temperature 100.1, blood pressure 122/54, heart rate 80, tachypnea, oxygen saturation 94 to 99% on room air.  CT abdomen/pelvis that showed perforated sigmoid diverticulitis.  Patient is admitted to [place] bed as inpatient.  Review of Systems:   General: no subjective fevers, chills, no body weight gain, has fatigue HEENT: no blurry vision, hearing changes or sore throat Respiratory: no dyspnea, coughing, wheezing CV: no chest pain, no palpitations GI: no nausea, vomiting, has abdominal pain, no diarrhea, constipation GU: no dysuria, burning on urination, increased urinary frequency, hematuria  Ext: no leg edema Neuro: no unilateral weakness, numbness, or tingling, no vision change or hearing loss Skin: no rash, no skin tear. MSK: No muscle spasm, no deformity, no limitation of range of movement in spin Heme: No easy bruising.    Travel history: No recent long distant travel.  Allergy:  Allergies  Allergen Reactions   Ace Inhibitors Cough   Iodinated Diagnostic Agents Hives    Chills   Lisinopril Cough   Sulfa Antibiotics Hives   Bactrim Rash   Flexeril [Cyclobenzaprine Hcl] Nausea Only   Latex Itching   Naprosyn [Naproxen] Nausea And Vomiting    And high doses of NSAIDS    Past Medical History:  Diagnosis Date   Acute maxillary sinusitis 11/12/2015   Allergy    Anxiety state 05/17/2011   AR (allergic rhinitis) 02/17/2012   Asthma    BMI 31.0-31.9,adult 03/23/2011   Cough 03/23/2011   Dental disease 02/17/2012   [place] in [place] 01/2012    Essential hypertension, benign 03/23/2011   GERD (gastroesophageal reflux disease) 05/17/2011   H/O varicella    History of measles, mumps, or rubella    Hyperlipemia 03/23/2011   Hyperlipidemia    Hypertension    Hypertensive disorder 09/30/2016   Insomnia 05/17/2011   Mid back pain 02/17/2012   Neck pain 02/17/2012   Obesity 09/30/2016   Pelvic floor relaxation 09/30/2016   RAD (reactive airway disease) 02/17/2012    Past Surgical History:  Procedure Laterality Date   TUBAL LIGATION     VAGINAL HYSTERECTOMY     uterine prolapse; DUB; ovaries intact.  [place]    Social History:  reports that she has never smoked. She has never used smokeless tobacco. She reports that she does not drink alcohol or use drugs.  Family History:  Family History  Problem Relation Age of Onset   Hypertension Mother    Stroke Mother  Hypertension Brother    Stroke Brother    Cancer Maternal Grandmother        ovarian   Hypertension Sister    Stroke Sister 48       CVA     Prior to Admission medications   Medication Sig Start Date End Date Taking? Authorizing Provider  albuterol (PROVENTIL HFA) 108 (90 Base) MCG/ACT inhaler INHALE 2 PUFFS INTO THE LUNGS EVERY 6 HOURS AS NEEDED FOR WHEEZING OR SHORTNESS OF BREATH 04/21/18   [name], MD  aMILoride (MIDAMOR) 5 MG tablet Take 1 tablet (5 mg total) by mouth daily. 04/21/18   [name], MD  aspirin 81 MG tablet Take 81 mg by mouth daily.    [provider]  carvedilol (COREG) 12.5 MG tablet Take 1 tablet (12.5 mg total) by mouth 2 (two) times daily. 11/17/18   Baldo Daub, MD  cloNIDine (CATAPRES) 0.1 MG tablet Take 1 tablet (0.1 mg total) by mouth at bedtime. 11/17/18   Baldo Daub, MD  diazepam (VALIUM) 10 MG tablet TAKE 1 TABLET BY MOUTH EVERY DAY AS NEEDED FOR MUSCLE SPASM 09/13/18   [name], MD  diltiazem (CARDIZEM CD) 120 MG 24 hr capsule Take 1 capsule (120 mg total) by mouth every morning. 11/17/18   Baldo Daub, MD  diltiazem (CARDIZEM CD) 240 MG 24 hr capsule 1 CAPSULE IN EVENING 11/17/18   Baldo Daub, MD  famotidine (PEPCID) 40 MG tablet Take 1 tablet (40 mg total) by mouth daily. 04/21/18   [name], MD  fluticasone [facility]) 50 MCG/ACT nasal spray PLACE 2 SPRAYS IN THE NOSTRIL EVERY DAY 04/21/18   [name], MD  hydrALAZINE (APRESOLINE) 25 MG tablet Take 1 tablet (25 mg total) by mouth 3 (three) times daily. 11/17/18   Baldo Daub, MD  ibuprofen (ADVIL) 800 MG tablet TAKE 1 TABLET BY MOUTH TWICE A DAY AS NEEDED 11/23/18   [name], MD  lactulose (CHRONULAC) 10 GM/15ML solution Take 15 mLs (10 g total) by mouth 3 (three) times daily. 04/21/18   [name], MD  latanoprost (XALATAN) 0.005 % ophthalmic solution PLACE 1 DROP INTO BOTH EYES TWICE A DAY 04/20/15   Tonye Pearson, MD  montelukast (SINGULAIR) 10 MG tablet Take 1 tablet (10 mg total) by mouth at bedtime. 04/21/18   [name], MD  omeprazole (PRILOSEC) 40 MG capsule Take 1 capsule (40 mg total) by mouth daily. 04/21/18   [name], MD  potassium chloride (KLOR-CON 10) 10 MEQ tablet TAKE 3 TABLETS BY MOUTH EVERY DAY 11/17/18   Baldo Daub, MD  rosuvastatin (CRESTOR) 20 MG tablet 1 tablet daily 04/21/18   [name], MD   traMADol (ULTRAM) 50 MG tablet TAKE 1 TABLET BY MOUTH EVERY 12 HOURS AS NEEDED 09/13/18   [name], MD  zinc sulfate 220 MG capsule Take 1 capsule (220 mg total) by mouth daily. 02/17/15   Tonye Pearson, MD    Physical Exam: Vitals:   12/01/18 0245 12/01/18 0300 12/01/18 0345 12/01/18 0445  BP: (!) 122/54 127/62 123/60 (!) 141/66  Pulse: 80 79 88 85  Resp: (!) 22 (!) 25 (!) 24 20  Temp:      TempSrc:      SpO2: 96%  98% 99%  Weight:      Height:       General: Not in acute distress HEENT:  Eyes: PERRL, EOMI, no scleral icterus.       ENT: No discharge from the ears and nose, no pharynx injection, no tonsillar enlargement.        Neck: No JVD, no bruit, no mass felt. Heme: No neck lymph node enlargement. Cardiac: S1/S2, RRR, No murmurs, No gallops or rubs. Respiratory: Good air movement bilaterally. No rales, wheezing, rhonchi or rubs. GI: mildly distended, diffused tenderness, worst in lower abdomen, no rebound pain, no organomegaly, BS present. GU: No hematuria Ext: No pitting leg edema bilaterally. 2+DP/PT pulse bilaterally. Musculoskeletal: No joint deformities, No joint redness or warmth, no limitation of ROM in spin. Skin: No rashes.  Neuro: Alert, oriented X3, cranial nerves II-XII grossly intact, moves all extremities normally.  Psych: Patient is not psychotic, no suicidal or hemocidal ideation.  Labs on Admission: I have personally reviewed following labs and imaging studies  CBC: Recent Labs  Lab 11/30/18 1825 12/01/18 0613  WBC 22.4* 20.8*  HGB 12.3 12.1  HCT 39.4 39.2  MCV 88.7 90.1  PLT 220 212   Basic Metabolic Panel: Recent Labs  Lab 11/30/18 1825 12/01/18 0613  NA 137 138  K 4.8 4.3  CL 108 107  CO2 18* 18*  GLUCOSE 115* 121*  BUN 28* 23  CREATININE 1.17* 1.02*  CALCIUM 9.1 8.7*   GFR: Estimated Creatinine Clearance: 48.3 mL/min (A) (by C-G formula based on SCr of 1.02 mg/dL (H)). Liver Function Tests: Recent Labs  Lab  11/30/18 1825  AST 16  ALT 20  ALKPHOS 88  BILITOT 1.1  PROT 6.7  ALBUMIN 3.4*   Recent Labs  Lab 11/30/18 1825  LIPASE 30   No results for input(s): AMMONIA in the last 168 hours. Coagulation Profile: Recent Labs  Lab 12/01/18 0412  INR 1.2   Cardiac Enzymes: No results for input(s): CKTOTAL, CKMB, CKMBINDEX, TROPONINI in the last 168 hours. BNP (last 3 results) No results for input(s): PROBNP in the last 8760 hours. HbA1C: No results for input(s): HGBA1C in the last 72 hours. CBG: Recent Labs  Lab 12/01/18 0744  GLUCAP 105*   Lipid Profile: No results for input(s): CHOL, HDL, LDLCALC, TRIG, CHOLHDL, LDLDIRECT in the last 72 hours. Thyroid Function Tests: No results for input(s): TSH, T4TOTAL, FREET4, T3FREE, THYROIDAB in the last 72 hours. Anemia Panel: No results for input(s): VITAMINB12, FOLATE, FERRITIN, TIBC, IRON, RETICCTPCT in the last 72 hours. Urine analysis:    Component Value Date/Time   COLORURINE AMBER (A) 12/01/2018 0256   APPEARANCEUR HAZY (A) 12/01/2018 0256   LABSPEC 1.028 12/01/2018 0256   PHURINE 5.0 12/01/2018 0256   GLUCOSEU NEGATIVE 12/01/2018 0256   HGBUR NEGATIVE 12/01/2018 0256   BILIRUBINUR NEGATIVE 12/01/2018 0256   BILIRUBINUR negative 11/13/2016 1608   BILIRUBINUR NEG 05/17/2011 0857   KETONESUR NEGATIVE 12/01/2018 0256   PROTEINUR 100 (A) 12/01/2018 0256   UROBILINOGEN 0.2 11/13/2016 1608   NITRITE NEGATIVE 12/01/2018 0256   LEUKOCYTESUR NEGATIVE 12/01/2018 0256   Sepsis Labs: @LABRCNTIP (procalcitonin:4,lacticidven:4) )No results found for this or any previous visit (from the past 240 hour(s)).   Radiological Exams on Admission: Ct Abdomen Pelvis Wo Contrast  Addendum Date: 12/01/2018   ADDENDUM REPORT: 12/01/2018 03:01 ADDENDUM: Study discussed by telephone with PA SHAWN JOY on 12/01/2018 at 0247 hours. Electronically Signed   By: Odessa FlemingH  Hall M.D.   On: 12/01/2018 03:01   Result Date: 12/01/2018 CLINICAL DATA:   63 year old female with lower abdominal pain. EXAM: CT ABDOMEN AND PELVIS WITHOUT CONTRAST TECHNIQUE: Multidetector CT  imaging of the abdomen and pelvis was performed following the standard protocol without IV contrast. COMPARISON:  Abdomen ultrasound 11/17/2009. FINDINGS: Lower chest: Mild cardiomegaly. No pericardial effusion. Right middle lobe bronchiectasis. Bilateral lower lobe streaky peribronchial opacity which most resembles atelectasis or scarring. No pleural effusion. Hepatobiliary: Trace adjacent pneumoperitoneum. Negative noncontrast liver and gallbladder. Pancreas: Negative. Spleen: Chronic splenic cysts which were demonstrated by ultrasound in 2011, benign. Simple fluid density on CT today. Adrenals/Urinary Tract: Negative adrenal glands. Negative noncontrast kidneys. No dilated ureters. There is inflammation along the distal course of the left ureter as below. Stomach/Bowel: Fluid-filled rectum and distal sigmoid colon. Confluent inflammation affecting the proximal sigmoid in an area of about 5 centimeters with underlying diverticulosis. Confluent mesenteric stranding (series 3, image 66 and coronal image 58). And thickening of the adjacent left lateral pelvic fossa. Furthermore, there is at least trace extraluminal gas demonstrated on series 3, image 68 and coronal image 57, plus a nearby 15 millimeter gas and intermediate density area which might also be a small contained perforation (coronal image 55). And additionally, there is trace pneumoperitoneum along the anterior superior liver seen on series 3, image 12 and coronal image 45. No free fluid. Fluid throughout the upstream descending colon which also demonstrates diverticulosis. Fluid in the transverse and right colon also with diverticula. Oral contrast was also administered and has just reached the cecum. Upper limits of normal appendix suspected on series 3, image 51. Some of the small bowel in the lower abdomen also appears mildly inflamed  (series 3, image 62). And small bowel is mildly dilated throughout the abdomen. The stomach and duodenum have a more normal appearance. Vascular/Lymphatic: Vascular patency is not evaluated in the absence of IV contrast. Minimal calcified aortic atherosclerosis. No lymphadenopathy. Reproductive: Surgically absent uterus. Diminutive or absent ovaries. Other: No pelvic free fluid. Musculoskeletal: No acute osseous abnormality identified. IMPRESSION: 1. Perforated acute diverticulitis of the proximal sigmoid colon. There is both a small area of contained perforation along the anterior inferior sigmoid mesentery (series 3, image 68), but also trace pneumoperitoneum over the liver. 2. Secondary appearing inflammation of distal small bowel, with a mild generalized ileus or bowel obstruction. 3. Non-acute findings: Mild cardiomegaly. Right middle lobe bronchiectasis and bilateral lung base atelectasis or scarring. Chronic benign splenic cysts. Electronically Signed: By: Odessa Fleming M.D. On: 12/01/2018 02:46     EKG: Not done in ED, will get one.   Assessment/Plan Principal Problem:   Perforated sigmoid colon (HCC) Active Problems:   Essential hypertension   Hyperlipemia   GERD (gastroesophageal reflux disease)   Sepsis (HCC)   Asthma   Anxiety   CKD (chronic kidney disease), stage II   Sepsis due to perforated sigmoid colon [facility]): Patient Ms. Cartee for sepsis with leukocytosis, fever and hypercapnia.  Currently hemodynamically stable.  Lactic acid 0.7.  General surgeon, Dr. Andrey Campanile was consulted.  -Admitted to Med[facility] bed as inpatient -IV Zosyn -Blood culture -will get Procalcitonin and trend lactic acid levels per sepsis protocol. -IVF: 1L of NS bolus in ED, followed by 100 cc/h  - per Dilaudid for pain, Zofran for nausea  Essential hypertension: Bp 122/54 -continue Coreg, clonidine, Cardizem, hydralazine -Patient is taking Cardizem 120 mg in the morning and 240 mg in the evening, will DC evening  dose since patient has sepsis  -DC amiloride -As needed labetalol IV  Hyperlipemia: -crestor  GERD (gastroesophageal reflux disease): -pepcid and protonix  Asthma: stable -Continue Singulair and bronchodilators  Anxiety: -continue home Valium  CKD (chronic kidney disease), stage II: Baseline creatinine 0.5-0.8.  Her creatinine is 1.17, BUN 28.  Slightly worsening. -IV fluid as above -Follow-up BMP   Inpatient status:  # Patient requires inpatient status due to high intensity of service, high risk for further deterioration and high frequency of surveillance required.  I certify that at the point of admission it is my clinical judgment that the patient will require inpatient hospital care spanning beyond 2 midnights from the point of admission.   This patient has multiple chronic comorbidities including hypertension, hyperlipidemia, asthma, GERD, anxiety, sinusitis, CKD stage II, pelvic floor relaxation  Now patient has presenting with sepsis due to perforated sigmoid colon  The worrisome physical exam findings include abdominal tenderness  The initial radiographic and laboratory data are worrisome because of perforated sigmoid colon by CT scan, leukocytosis  Current medical needs: please see my assessment and plan  Predictability of an adverse outcome (risk): Patient's multiple complete list, now presents with sepsis due to perforated sigmoid colon.  Her presentation is highly complicated.  Patient is at high risk of deteriorating.  If patient deteriorates, patient will need surgery.  Needs to be treated in hospital for at least 2 days.    DVT ppx: SCD Code Status: Full code Family Communication:  Yes, patient's daughter   at bed side Disposition Plan:  Anticipate discharge back to previous home environment Consults called:  Dr. Andrey Campanile of general surgeon was consulted by EDP. Admission status:    medical floor/inpt  Date of Service 12/01/2018    Lorretta Harp [facility]   If 7PM-7AM, please contact night-coverage [email or web address] Password [email or web address] 12/01/2018, 8:01 AM

## 2018-12-01 NOTE — ED Provider Notes (Signed)
Patient seen/examined in the Emergency Department in conjunction with Midlevel Provider [place] Patient reports diffuse abdominal pain Exam : awake/alert, diffuse abdominal tenderness Plan: pt will need CT imaging.  Pt noted to have elevated WBC   Ripley Fraise, MD 12/01/18 [id number]

## 2018-12-01 NOTE — Progress Notes (Signed)
PROGRESS NOTE    Brooke Wilson  DQQ:[id number] DOB: April 25, 1955 DOA: 11/30/2018 PCP: [name], MD   Brief Narrative:  HPI on 12/01/2018 by Dr. Trixie Dredge is a 63 y.o. female with medical history significant of hypertension, hyperlipidemia, asthma, GERD, anxiety, sinusitis, CKD stage II, pelvic floor relaxation, who presents with abdominal pain.  Patient states that her abdominal pain started last night, which is located mainly in lower abdomen, also involving whole abdomen, constant, sharp, 10 out of 10 severity, nonradiating.  Patient denies nausea vomiting, diarrhea.  Patient does not have chills or subjective fever, but her temperature is 100.1 in ED.  Patient does not have chest pain, shortness of breath, cough.  No symptoms of UTI or unilateral weakness.  Assessment & Plan   Sepsis secondary to perforated sigmoid colon -Patient presented with leukocytosis and tachycardia, fever -T abdomen pelvis shows perforated acute diverticulitis of the proximal sigmoid colon.  Small area of contained perforation along the anterior inferior sigmoid mesentery but also trace pneumoperitoneum over the liver.  Secondary appearing inflammation of the distal small bowel with a mild generalized ileus or bowel obstruction. -General surgery consulted and appreciated -Blood cultures pending -Currently n.p.o. -Continue IV fluids along with Zosyn, pain control as well as antiemetics as needed  Essential hypertension -Continue clonidine patch, other home medications currently held as patient is n.p.o.  Hyperlipidemia -Statin held as patient is n.p.o.  GERD -Continue famotidine  Asthma -Stable, no wheezing -Continue albuterol as needed  Anxiety -Continue Ativan PRN  Mild acute kidney injury -Creatinine on admission 1.17, baseline appears to be approximately 0.7 -Continue IV fluids and monitor BMP  DVT Prophylaxis  SCDs  Code Status: Full  Family Communication:  None at bedside  Disposition Plan: Admitted  Consultants General surgery  Procedures  None  Antibiotics   Anti-infectives (From admission, onward)   Start     Dose/Rate Route Frequency Ordered Stop   12/01/18 0800  piperacillin-tazobactam (ZOSYN) IVPB 3.375 g     3.375 g 12.5 mL/hr over 240 Minutes Intravenous Every 8 hours 12/01/18 0347     12/01/18 0300  piperacillin-tazobactam (ZOSYN) IVPB 3.375 g     3.375 g 100 mL/hr over 30 Minutes Intravenous  Once 12/01/18 0248 12/01/18 0438      Subjective:   Brooke Wilson seen and examined today.  Feels abdominal pain is still present and just received Dilaudid.  Denies current chest pain or shortness of breath, dizziness or headache.    Objective:   Vitals:   12/01/18 0900 12/01/18 0915 12/01/18 1015 12/01/18 1045  BP: 119/73 132/77 139/70 119/72  Pulse: 77     Resp: 13 11 19 14   Temp:      TempSrc:      SpO2: 97%  97%   Weight:      Height:        Intake/Output Summary (Last 24 hours) at 12/01/2018 1526 Last data filed at 12/01/2018 1248 Gross per 24 hour  Intake 100 ml  Output -  Net 100 ml   Filed Weights   11/30/18 1819  Weight: 68.9 kg    Exam  General: Well developed, well nourished, NAD, appears stated age  HEENT: NCAT,mucous membranes moist.   Neck: Supple  Cardiovascular: S1 S2 auscultated, no rubs, murmurs or gallops. Regular rate and rhythm.  Respiratory: Clear to auscultation bilaterally with equal chest rise  Abdomen: Soft, tender, nondistended, + bowel sounds  Extremities: warm dry without cyanosis clubbing or edema  Neuro: AAOx3, nonfocal  Psych: Normal affect and demeanor with intact judgement and insight   Data Reviewed: I have personally reviewed following labs and imaging studies  CBC: Recent Labs  Lab 11/30/18 1825 12/01/18 0613  WBC 22.4* 20.8*  HGB 12.3 12.1  HCT 39.4 39.2  MCV 88.7 90.1  PLT 220 212   Basic Metabolic Panel: Recent Labs  Lab 11/30/18 1825  12/01/18 0613  NA 137 138  K 4.8 4.3  CL 108 107  CO2 18* 18*  GLUCOSE 115* 121*  BUN 28* 23  CREATININE 1.17* 1.02*  CALCIUM 9.1 8.7*   GFR: Estimated Creatinine Clearance: 48.3 mL/min (A) (by C-G formula based on SCr of 1.02 mg/dL (H)). Liver Function Tests: Recent Labs  Lab 11/30/18 1825  AST 16  ALT 20  ALKPHOS 88  BILITOT 1.1  PROT 6.7  ALBUMIN 3.4*   Recent Labs  Lab 11/30/18 1825  LIPASE 30   No results for input(s): AMMONIA in the last 168 hours. Coagulation Profile: Recent Labs  Lab 12/01/18 0412  INR 1.2   Cardiac Enzymes: No results for input(s): CKTOTAL, CKMB, CKMBINDEX, TROPONINI in the last 168 hours. BNP (last 3 results) No results for input(s): PROBNP in the last 8760 hours. HbA1C: No results for input(s): HGBA1C in the last 72 hours. CBG: Recent Labs  Lab 12/01/18 0744  GLUCAP 105*   Lipid Profile: No results for input(s): CHOL, HDL, LDLCALC, TRIG, CHOLHDL, LDLDIRECT in the last 72 hours. Thyroid Function Tests: No results for input(s): TSH, T4TOTAL, FREET4, T3FREE, THYROIDAB in the last 72 hours. Anemia Panel: No results for input(s): VITAMINB12, FOLATE, FERRITIN, TIBC, IRON, RETICCTPCT in the last 72 hours. Urine analysis:    Component Value Date/Time   COLORURINE AMBER (A) 12/01/2018 0256   APPEARANCEUR HAZY (A) 12/01/2018 0256   LABSPEC 1.028 12/01/2018 0256   PHURINE 5.0 12/01/2018 0256   GLUCOSEU NEGATIVE 12/01/2018 0256   HGBUR NEGATIVE 12/01/2018 0256   BILIRUBINUR NEGATIVE 12/01/2018 0256   BILIRUBINUR negative 11/13/2016 1608   BILIRUBINUR NEG 05/17/2011 0857   KETONESUR NEGATIVE 12/01/2018 0256   PROTEINUR 100 (A) 12/01/2018 0256   UROBILINOGEN 0.2 11/13/2016 1608   NITRITE NEGATIVE 12/01/2018 0256   LEUKOCYTESUR NEGATIVE 12/01/2018 0256   Sepsis Labs: @LABRCNTIP (procalcitonin:4,lacticidven:4)  ) Recent Results (from the past 240 hour(s))  SARS CORONAVIRUS 2 (TAT 6-24 HRS) Nasopharyngeal Nasopharyngeal Swab      Status: None   Collection Time: 12/01/18  3:35 AM   Specimen: Nasopharyngeal Swab  Result Value Ref Range Status   SARS Coronavirus 2 NEGATIVE NEGATIVE Final    Comment: (NOTE) SARS-CoV-2 target nucleic acids are NOT DETECTED. The SARS-CoV-2 RNA is generally detectable in upper and lower respiratory specimens during the acute phase of infection. Negative results do not preclude SARS-CoV-2 infection, do not rule out co-infections with other pathogens, and should not be used as the sole basis for treatment or other patient management decisions. Negative results must be combined with clinical observations, patient history, and epidemiological information. The expected result is Negative. Fact Sheet for Patients: HairSlick.nohttps://www.fda.gov/media/138098/download Fact Sheet for Healthcare Providers: quierodirigir.comhttps://www.fda.gov/media/138095/download This test is not yet approved or cleared by the [place] FDA and  has been authorized for detection and/or diagnosis of SARS-CoV-2 by FDA under an Emergency Use Authorization (EUA). This EUA will remain  in effect (meaning this test can be used) for the duration of the COVID-19 declaration under Section 56 4(b)(1) of the Act, 21 U.S.C. section 360bbb-3(b)(1), unless the authorization is terminated or revoked sooner. Performed at  [facility], [place] [address] [place], [place] [address]       Radiology Studies: Ct Abdomen Pelvis Wo Contrast  Addendum Date: 12/01/2018   ADDENDUM REPORT: 12/01/2018 03:01 ADDENDUM: Study discussed by telephone with PA SHAWN JOY on 12/01/2018 at 0247 hours. Electronically Signed   By: Genevie Ann M.D.   On: 12/01/2018 03:01   Result Date: 12/01/2018 CLINICAL DATA:  63 year old female with lower abdominal pain. EXAM: CT ABDOMEN AND PELVIS WITHOUT CONTRAST TECHNIQUE: Multidetector CT imaging of the abdomen and pelvis was performed following the standard protocol without IV contrast. COMPARISON:  Abdomen ultrasound  11/17/2009. FINDINGS: Lower chest: Mild cardiomegaly. No pericardial effusion. Right middle lobe bronchiectasis. Bilateral lower lobe streaky peribronchial opacity which most resembles atelectasis or scarring. No pleural effusion. Hepatobiliary: Trace adjacent pneumoperitoneum. Negative noncontrast liver and gallbladder. Pancreas: Negative. Spleen: Chronic splenic cysts which were demonstrated by ultrasound in 2011, benign. Simple fluid density on CT today. Adrenals/Urinary Tract: Negative adrenal glands. Negative noncontrast kidneys. No dilated ureters. There is inflammation along the distal course of the left ureter as below. Stomach/Bowel: Fluid-filled rectum and distal sigmoid colon. Confluent inflammation affecting the proximal sigmoid in an area of about 5 centimeters with underlying diverticulosis. Confluent mesenteric stranding (series 3, image 66 and coronal image 58). And thickening of the adjacent left lateral pelvic fossa. Furthermore, there is at least trace extraluminal gas demonstrated on series 3, image 68 and coronal image 57, plus a nearby 15 millimeter gas and intermediate density area which might also be a small contained perforation (coronal image 55). And additionally, there is trace pneumoperitoneum along the anterior superior liver seen on series 3, image 12 and coronal image 45. No free fluid. Fluid throughout the upstream descending colon which also demonstrates diverticulosis. Fluid in the transverse and right colon also with diverticula. Oral contrast was also administered and has just reached the cecum. Upper limits of normal appendix suspected on series 3, image 51. Some of the small bowel in the lower abdomen also appears mildly inflamed (series 3, image 62). And small bowel is mildly dilated throughout the abdomen. The stomach and duodenum have a more normal appearance. Vascular/Lymphatic: Vascular patency is not evaluated in the absence of IV contrast. Minimal calcified aortic  atherosclerosis. No lymphadenopathy. Reproductive: Surgically absent uterus. Diminutive or absent ovaries. Other: No pelvic free fluid. Musculoskeletal: No acute osseous abnormality identified. IMPRESSION: 1. Perforated acute diverticulitis of the proximal sigmoid colon. There is both a small area of contained perforation along the anterior inferior sigmoid mesentery (series 3, image 68), but also trace pneumoperitoneum over the liver. 2. Secondary appearing inflammation of distal small bowel, with a mild generalized ileus or bowel obstruction. 3. Non-acute findings: Mild cardiomegaly. Right middle lobe bronchiectasis and bilateral lung base atelectasis or scarring. Chronic benign splenic cysts. Electronically Signed: By: Genevie Ann M.D. On: 12/01/2018 02:46     Scheduled Meds: . [START ON 12/02/2018] cloNIDine  0.1 mg Transdermal Weekly  . fluticasone  2 spray Each Nare Daily  . latanoprost  1 drop Both Eyes BID   Continuous Infusions: . sodium chloride 100 mL/hr at 12/01/18 0437  . famotidine (PEPCID) IV Stopped (12/01/18 1248)  . piperacillin-tazobactam (ZOSYN)  IV Stopped (12/01/18 1202)     LOS: 0 days   Time Spent in minutes   30 minutes  Dorean Hiebert D.O. on 12/01/2018 at 3:26 PM  Between 7am to 7pm - Please see pager noted on [email or web address]  After 7pm go to [email or web address]  And look  for the night coverage person covering for me after hours  [facility] Office  [phone]

## 2018-12-02 DIAGNOSIS — E782 Mixed hyperlipidemia: Secondary | ICD-10-CM

## 2018-12-02 LAB — CBC
HCT: 33.4 % — ABNORMAL LOW (ref 36.0–46.0)
Hemoglobin: 10.4 g/dL — ABNORMAL LOW (ref 12.0–15.0)
MCH: 27.7 pg (ref 26.0–34.0)
MCHC: 31.1 g/dL (ref 30.0–36.0)
MCV: 89.1 fL (ref 80.0–100.0)
Platelets: 190 10*3/uL (ref 150–400)
RBC: 3.75 MIL/uL — ABNORMAL LOW (ref 3.87–5.11)
RDW: 14.2 % (ref 11.5–15.5)
WBC: 15.8 10*3/uL — ABNORMAL HIGH (ref 4.0–10.5)
nRBC: 0 % (ref 0.0–0.2)

## 2018-12-02 LAB — BASIC METABOLIC PANEL
Anion gap: 11 (ref 5–15)
BUN: 20 mg/dL (ref 8–23)
CO2: 18 mmol/L — ABNORMAL LOW (ref 22–32)
Calcium: 8.4 mg/dL — ABNORMAL LOW (ref 8.9–10.3)
Chloride: 108 mmol/L (ref 98–111)
Creatinine, Ser: 0.85 mg/dL (ref 0.44–1.00)
GFR calc Af Amer: >60 mL/min (ref >60)
GFR calc non Af Amer: >60 mL/min (ref >60)
Glucose, Bld: 68 mg/dL — ABNORMAL LOW (ref 70–99)
Potassium: 3.9 mmol/L (ref 3.5–5.1)
Sodium: 137 mmol/L (ref 135–145)

## 2018-12-02 LAB — GLUCOSE, CAPILLARY
Glucose-Capillary: 64 mg/dL — ABNORMAL LOW (ref 70–99)
Glucose-Capillary: 126 mg/dL — ABNORMAL HIGH (ref 70–99)
Glucose-Capillary: 84 mg/dL (ref 70–99)

## 2018-12-02 MED ORDER — SODIUM CHLORIDE 0.9% FLUSH: 10.0000 mL | INTRAVENOUS | Status: DC | PRN

## 2018-12-02 MED ORDER — NAPHAZOLINE-GLYCERIN 0.012-0.2 % OP SOLN
1.0000 [drp] | Freq: Four times a day (QID) | OPHTHALMIC | Status: DC | PRN
  Filled 2018-12-02: qty 15

## 2018-12-02 MED ORDER — SODIUM CHLORIDE 0.9% FLUSH
10.0000 mL | Freq: Two times a day (BID) | INTRAVENOUS | Status: DC
  Administered 2018-12-03: 10 mL

## 2018-12-02 MED ORDER — DEXTROSE 50 % IV SOLN
INTRAVENOUS | Status: AC
  Administered 2018-12-02: 08:00:00
  Filled 2018-12-02: qty 50

## 2018-12-02 MED ORDER — ORAL CARE MOUTH RINSE
15.0000 mL | Freq: Two times a day (BID) | OROMUCOSAL | Status: DC
  Administered 2018-12-02 – 2018-12-03 (×3): 15 mL via OROMUCOSAL

## 2018-12-02 MED ORDER — DEXTROSE 50 % IV SOLN
12.5000 g | INTRAVENOUS | Status: AC
  Administered 2018-12-02: 12.5 g via INTRAVENOUS

## 2018-12-02 MED ORDER — DEXTROSE-NACL 5-0.45 % IV SOLN
INTRAVENOUS | Status: DC
  Administered 2018-12-02 (×2): via INTRAVENOUS

## 2018-12-02 NOTE — Progress Notes (Signed)
PROGRESS NOTE    Brooke Wilson  AUQ:[id number] DOB: 06-22-1955 DOA: 11/30/2018 PCP: Rutherford Guys, MD   Brief Narrative:  HPI on 12/01/2018 by Dr. Steffanie Dunn is a 63 y.o. female with medical history significant of hypertension, hyperlipidemia, asthma, GERD, anxiety, sinusitis, CKD stage II, pelvic floor relaxation, who presents with abdominal pain.  Patient states that her abdominal pain started last night, which is located mainly in lower abdomen, also involving whole abdomen, constant, sharp, 10 out of 10 severity, nonradiating.  Patient denies nausea vomiting, diarrhea.  Patient does not have chills or subjective fever, but her temperature is 100.1 in ED.  Patient does not have chest pain, shortness of breath, cough.  No symptoms of UTI or unilateral weakness.  Interim history Admitted for sepsis with perforated sigmoid colon.  On antibiotics.  Currently n.p.o.  General surgery following.  Assessment & Plan   Sepsis secondary to perforated sigmoid colon -Patient presented with leukocytosis and tachycardia, fever.  -sepsis morphology improving, leukocytosis trending downward, currently 15.8.  Patient no longer tachycardic or febrile -CT abdomen pelvis shows perforated acute diverticulitis of the proximal sigmoid colon.  Small area of contained perforation along the anterior inferior sigmoid mesentery but also trace pneumoperitoneum over the liver.  Secondary appearing inflammation of the distal small bowel with a mild generalized ileus or bowel obstruction. -General surgery consulted and appreciated -Blood cultures show no growth -Currently n.p.o. -Continue IV fluids along with Zosyn, pain control as well as antiemetics as needed  Hyperglycemia -Patient with hypoglycemic episode this morning. -We will place her on D5 half-normal  Essential hypertension -Home meds as patient is NPO -BP stable -Continue labetaolol PRN  Hyperlipidemia -Statin held as  patient is n.p.o.  GERD -Continue famotidine  Asthma -Stable, no wheezing -Continue albuterol as needed  Anxiety -Continue Ativan PRN  Mild acute kidney injury -Creatinine on admission 1.17, baseline appears to be approximately 0.7 -Creatinine improved to 0.85 -Continue IV fluids and monitor BMP  Eye irritation -will order eye drops  IV access -patient requesting midline  DVT Prophylaxis  SCDs  Code Status: Full  Family Communication: None at bedside  Disposition Plan: Admitted. Pending improvement in abdominal pain  Consultants General surgery  Procedures  None  Antibiotics   Anti-infectives (From admission, onward)   Start     Dose/Rate Route Frequency Ordered Stop   12/01/18 0800  piperacillin-tazobactam (ZOSYN) IVPB 3.375 g     3.375 g 12.5 mL/hr over 240 Minutes Intravenous Every 8 hours 12/01/18 0347     12/01/18 0300  piperacillin-tazobactam (ZOSYN) IVPB 3.375 g     3.375 g 100 mL/hr over 30 Minutes Intravenous  Once 12/01/18 0248 12/01/18 0438      Subjective:   Brooke Wilson seen and examined today.  Feels abdominal pain has improved.  She states she is able to move little bit better.  Denies current nausea or vomiting.  Denies chest pain, shortness of breath, dizziness or headache.  Does complain of eye irritation. Objective:   Vitals:   12/01/18 2049 12/01/18 2112 12/02/18 0114 12/02/18 0455  BP: (!) 165/79 (!) 165/79 (!) 108/46 133/66  Pulse:  (!) 102  85  Resp:   20 18  Temp:    98 F (36.7 C)  TempSrc:    Oral  SpO2:   94% 97%  Weight:      Height:        Intake/Output Summary (Last 24 hours) at 12/02/2018 1142 Last data filed at 12/02/2018 0900 Gross  per 24 hour  Intake 650 ml  Output -  Net 650 ml   Filed Weights   11/30/18 1819 12/01/18 1955  Weight: 68.9 kg 68.9 kg   Exam  General: Well developed, well nourished, NAD, appears stated age  HEENT: NCAT,mucous membranes moist.   Cardiovascular: S1 S2 auscultated,  RRR, no murmur  Respiratory: Clear to auscultation bilaterally  Abdomen: Soft, mildly TTP, nondistended, + bowel sounds  Extremities: warm dry without cyanosis clubbing or edema  Neuro: AAOx3, nonfocal  Psych: Appropriate mood and affect  Data Reviewed: I have personally reviewed following labs and imaging studies  CBC: Recent Labs  Lab 11/30/18 1825 12/01/18 0613 12/02/18 0319  WBC 22.4* 20.8* 15.8*  HGB 12.3 12.1 10.4*  HCT 39.4 39.2 33.4*  MCV 88.7 90.1 89.1  PLT 220 212 190   Basic Metabolic Panel: Recent Labs  Lab 11/30/18 1825 12/01/18 0613 12/02/18 0319  NA 137 138 137  K 4.8 4.3 3.9  CL 108 107 108  CO2 18* 18* 18*  GLUCOSE 115* 121* 68*  BUN 28* 23 20  CREATININE 1.17* 1.02* 0.85  CALCIUM 9.1 8.7* 8.4*   GFR: Estimated Creatinine Clearance: 58 mL/min (by C-G formula based on SCr of 0.85 mg/dL). Liver Function Tests: Recent Labs  Lab 11/30/18 1825  AST 16  ALT 20  ALKPHOS 88  BILITOT 1.1  PROT 6.7  ALBUMIN 3.4*   Recent Labs  Lab 11/30/18 1825  LIPASE 30   No results for input(s): AMMONIA in the last 168 hours. Coagulation Profile: Recent Labs  Lab 12/01/18 0412  INR 1.2   Cardiac Enzymes: No results for input(s): CKTOTAL, CKMB, CKMBINDEX, TROPONINI in the last 168 hours. BNP (last 3 results) No results for input(s): PROBNP in the last 8760 hours. HbA1C: No results for input(s): HGBA1C in the last 72 hours. CBG: Recent Labs  Lab 12/01/18 0744 12/02/18 0748 12/02/18 0832  GLUCAP 105* 64* 126*   Lipid Profile: No results for input(s): CHOL, HDL, LDLCALC, TRIG, CHOLHDL, LDLDIRECT in the last 72 hours. Thyroid Function Tests: No results for input(s): TSH, T4TOTAL, FREET4, T3FREE, THYROIDAB in the last 72 hours. Anemia Panel: No results for input(s): VITAMINB12, FOLATE, FERRITIN, TIBC, IRON, RETICCTPCT in the last 72 hours. Urine analysis:    Component Value Date/Time   COLORURINE AMBER (A) 12/01/2018 0256   APPEARANCEUR  HAZY (A) 12/01/2018 0256   LABSPEC 1.028 12/01/2018 0256   PHURINE 5.0 12/01/2018 0256   GLUCOSEU NEGATIVE 12/01/2018 0256   HGBUR NEGATIVE 12/01/2018 0256   BILIRUBINUR NEGATIVE 12/01/2018 0256   BILIRUBINUR negative 11/13/2016 1608   BILIRUBINUR NEG 05/17/2011 0857   KETONESUR NEGATIVE 12/01/2018 0256   PROTEINUR 100 (A) 12/01/2018 0256   UROBILINOGEN 0.2 11/13/2016 1608   NITRITE NEGATIVE 12/01/2018 0256   LEUKOCYTESUR NEGATIVE 12/01/2018 0256   Sepsis Labs: (procalcitonin:4,lacticidven:4)  ) Recent Results (from the past 240 hour(s))  SARS CORONAVIRUS 2 (TAT 6-24 HRS) Nasopharyngeal Nasopharyngeal Swab     Status: None   Collection Time: 12/01/18  3:35 AM   Specimen: Nasopharyngeal Swab  Result Value Ref Range Status   SARS Coronavirus 2 NEGATIVE NEGATIVE Final    Comment: (NOTE) SARS-CoV-2 target nucleic acids are NOT DETECTED. The SARS-CoV-2 RNA is generally detectable in upper and lower respiratory specimens during the acute phase of infection. Negative results do not preclude SARS-CoV-2 infection, do not rule out co-infections with other pathogens, and should not be used as the sole basis for treatment or other patient management decisions. Negative  results must be combined with clinical observations, patient history, and epidemiological information. The expected result is Negative. Fact Sheet for Patients: HairSlick.no Fact Sheet for Healthcare Providers: quierodirigir.com This test is not yet approved or cleared by the [place] FDA and  has been authorized for detection and/or diagnosis of SARS-CoV-2 by FDA under an Emergency Use Authorization (EUA). This EUA will remain  in effect (meaning this test can be used) for the duration of the COVID-19 declaration under Section 56 4(b)(1) of the Act, 21 U.S.C. section 360bbb-3(b)(1), unless the authorization is terminated or revoked sooner. Performed  at [facility], [address] [place], [place] [address]   Culture, blood (x 2)     Status: None (Preliminary result)   Collection Time: 12/01/18  6:21 AM   Specimen: BLOOD LEFT HAND  Result Value Ref Range Status   Specimen Description BLOOD LEFT HAND  Final   Special Requests   Final    BOTTLES DRAWN AEROBIC AND ANAEROBIC Blood Culture adequate volume   Culture   Final    NO GROWTH 1 DAY Performed at [facility], [address] [place], [place] [address]    Report Status PENDING  Incomplete  Culture, blood (x 2)     Status: None (Preliminary result)   Collection Time: 12/01/18  6:21 AM   Specimen: BLOOD LEFT HAND  Result Value Ref Range Status   Specimen Description BLOOD LEFT HAND  Final   Special Requests   Final    BOTTLES DRAWN AEROBIC AND ANAEROBIC Blood Culture results may not be optimal due to an inadequate volume of blood received in culture bottles   Culture   Final    NO GROWTH 1 DAY Performed at [facility], [address], [place], [place] [address]    Report Status PENDING  Incomplete      Radiology Studies: Ct Abdomen Pelvis Wo Contrast  Addendum Date: 12/01/2018   ADDENDUM REPORT: 12/01/2018 03:01 ADDENDUM: Study discussed by telephone with PA SHAWN JOY on 12/01/2018 at 0247 hours. Electronically Signed   By: Odessa Fleming M.D.   On: 12/01/2018 03:01   Result Date: 12/01/2018 CLINICAL DATA:  63 year old female with lower abdominal pain. EXAM: CT ABDOMEN AND PELVIS WITHOUT CONTRAST TECHNIQUE: Multidetector CT imaging of the abdomen and pelvis was performed following the standard protocol without IV contrast. COMPARISON:  Abdomen ultrasound 11/17/2009. FINDINGS: Lower chest: Mild cardiomegaly. No pericardial effusion. Right middle lobe bronchiectasis. Bilateral lower lobe streaky peribronchial opacity which most resembles atelectasis or scarring. No pleural effusion. Hepatobiliary: Trace adjacent pneumoperitoneum. Negative noncontrast liver and  gallbladder. Pancreas: Negative. Spleen: Chronic splenic cysts which were demonstrated by ultrasound in 2011, benign. Simple fluid density on CT today. Adrenals/Urinary Tract: Negative adrenal glands. Negative noncontrast kidneys. No dilated ureters. There is inflammation along the distal course of the left ureter as below. Stomach/Bowel: Fluid-filled rectum and distal sigmoid colon. Confluent inflammation affecting the proximal sigmoid in an area of about 5 centimeters with underlying diverticulosis. Confluent mesenteric stranding (series 3, image 66 and coronal image 58). And thickening of the adjacent left lateral pelvic fossa. Furthermore, there is at least trace extraluminal gas demonstrated on series 3, image 68 and coronal image 57, plus a nearby 15 millimeter gas and intermediate density area which might also be a small contained perforation (coronal image 55). And additionally, there is trace pneumoperitoneum along the anterior superior liver seen on series 3, image 12 and coronal image 45. No free fluid. Fluid  throughout the upstream descending colon which also demonstrates diverticulosis. Fluid in the transverse and right colon also with diverticula. Oral contrast was also administered and has just reached the cecum. Upper limits of normal appendix suspected on series 3, image 51. Some of the small bowel in the lower abdomen also appears mildly inflamed (series 3, image 62). And small bowel is mildly dilated throughout the abdomen. The stomach and duodenum have a more normal appearance. Vascular/Lymphatic: Vascular patency is not evaluated in the absence of IV contrast. Minimal calcified aortic atherosclerosis. No lymphadenopathy. Reproductive: Surgically absent uterus. Diminutive or absent ovaries. Other: No pelvic free fluid. Musculoskeletal: No acute osseous abnormality identified. IMPRESSION: 1. Perforated acute diverticulitis of the proximal sigmoid colon. There is both a small area of contained  perforation along the anterior inferior sigmoid mesentery (series 3, image 68), but also trace pneumoperitoneum over the liver. 2. Secondary appearing inflammation of distal small bowel, with a mild generalized ileus or bowel obstruction. 3. Non-acute findings: Mild cardiomegaly. Right middle lobe bronchiectasis and bilateral lung base atelectasis or scarring. Chronic benign splenic cysts. Electronically Signed: By: Odessa FlemingH  Hall M.D. On: 12/01/2018 02:46     Scheduled Meds: . famotidine  40 mg Oral Daily  . fluticasone  2 spray Each Nare Daily  . latanoprost  1 drop Both Eyes BID  . mouth rinse  15 mL Mouth Rinse BID  . sodium chloride flush  10-40 mL Intracatheter Q12H   Continuous Infusions: . dextrose 5 % and 0.45% NaCl 75 mL/hr at 12/02/18 1102  . piperacillin-tazobactam (ZOSYN)  IV 3.375 g (12/02/18 0803)     LOS: 1 day   Time Spent in minutes   30 minutes  Erynn Vaca D.O. on 12/02/2018 at 11:42 AM  Between 7am to 7pm - Please see pager noted on [email or web address]  After 7pm go to [email or web address]  And look for the night coverage person covering for me after hours  [facility] Office  [phone]

## 2018-12-02 NOTE — Progress Notes (Addendum)
[facility]/Trauma Progress Note      Assessment/Plan HTN Asthma GERD HLD Prediabetic  Sigmoid diverticulitis with microperforation, small amount of free air - Continue medical management at this time.  IV fluids, bowel rest, antibiotics - If patient worsens or does not improve she will need surgical intervention - Patient still tender on exam this a.m., will not advance diet until pain improves - We will follow closely  FEN: N.p.o., ice chips okay VTE: SCD's, lovenox okay from our standpoint ID: Zosyn 10/16>>   WBC 15.8, afebrile Foley: none Follow up: TBD    LOS: 1 day    Subjective: CC: Abdominal pain  Patient is still having significant abdominal pain.  She is needing pain medicine for this.  She states she gets intermittent, cramping, severe pains.  She is passing flatus.  No nausea or vomiting.  Objective: Vital signs in last 24 hours: Temp:  [98 F (36.7 C)-99.6 F (37.6 C)] 98 F (36.7 C) (10/17 0455) Pulse Rate:  [85-102] 85 (10/17 0455) Resp:  [14-20] 18 (10/17 0455) BP: (108-165)/(46-79) 133/66 (10/17 0455) SpO2:  [92 %-97 %] 97 % (10/17 0455) Weight:  [68.9 kg] 68.9 kg (10/16 1955) Last BM Date: 12/01/18  Intake/Output from previous day: 10/16 0701 - 10/17 0700 In: 650 [I.V.:500; IV Piggyback:150] Out: -  Intake/Output this shift: No intake/output data recorded.  PE: Gen:  Alert, NAD, pleasant, cooperative Pulm: Rate and effort normal Abd: Soft, ND, +BS, no HSM, TTP in lower abdomen with guarding, no peritonitis Skin: no rashes noted, warm and dry   Anti-infectives: Anti-infectives (From admission, onward)   Start     Dose/Rate Route Frequency Ordered Stop   12/01/18 0800  piperacillin-tazobactam (ZOSYN) IVPB 3.375 g     3.375 g 12.5 mL/hr over 240 Minutes Intravenous Every 8 hours 12/01/18 0347     12/01/18 0300  piperacillin-tazobactam (ZOSYN) IVPB 3.375 g     3.375 g 100 mL/hr over 30 Minutes Intravenous  Once 12/01/18  0248 12/01/18 0438      Lab Results:  Recent Labs    12/01/18 0613 12/02/18 0319  WBC 20.8* 15.8*  HGB 12.1 10.4*  HCT 39.2 33.4*  PLT 212 190   BMET Recent Labs    12/01/18 0613 12/02/18 0319  NA 138 137  K 4.3 3.9  CL 107 108  CO2 18* 18*  GLUCOSE 121* 68*  BUN 23 20  CREATININE 1.02* 0.85  CALCIUM 8.7* 8.4*   PT/INR Recent Labs    12/01/18 0412  LABPROT 14.7  INR 1.2   CMP     Component Value Date/Time   NA 137 12/02/2018 0319   NA 143 09/05/2018 0816   K 3.9 12/02/2018 0319   CL 108 12/02/2018 0319   CO2 18 (L) 12/02/2018 0319   GLUCOSE 68 (L) 12/02/2018 0319   BUN 20 12/02/2018 0319   BUN 15 09/05/2018 0816   CREATININE 0.85 12/02/2018 0319   CREATININE 0.63 06/02/2015 0950   CALCIUM 8.4 (L) 12/02/2018 0319   PROT 6.7 11/30/2018 1825   PROT 6.2 09/05/2018 0816   ALBUMIN 3.4 (L) 11/30/2018 1825   ALBUMIN 4.3 09/05/2018 0816   AST 16 11/30/2018 1825   ALT 20 11/30/2018 1825   ALKPHOS 88 11/30/2018 1825   BILITOT 1.1 11/30/2018 1825   BILITOT 0.2 09/05/2018 0816   GFRNONAA >60 12/02/2018 0319   GFRNONAA >89 03/17/2014 0951   GFRAA >60 12/02/2018 0319   GFRAA >89 03/17/2014 0951   Lipase  Component Value Date/Time   LIPASE 30 11/30/2018 1825    Studies/Results: Ct Abdomen Pelvis Wo Contrast  Addendum Date: 12/01/2018   ADDENDUM REPORT: 12/01/2018 03:01 ADDENDUM: Study discussed by telephone with PA SHAWN JOY on 12/01/2018 at 0247 hours. Electronically Signed   By: Genevie Ann M.D.   On: 12/01/2018 03:01   Result Date: 12/01/2018 CLINICAL DATA:  63 year old female with lower abdominal pain. EXAM: CT ABDOMEN AND PELVIS WITHOUT CONTRAST TECHNIQUE: Multidetector CT imaging of the abdomen and pelvis was performed following the standard protocol without IV contrast. COMPARISON:  Abdomen ultrasound 11/17/2009. FINDINGS: Lower chest: Mild cardiomegaly. No pericardial effusion. Right middle lobe bronchiectasis. Bilateral lower lobe streaky  peribronchial opacity which most resembles atelectasis or scarring. No pleural effusion. Hepatobiliary: Trace adjacent pneumoperitoneum. Negative noncontrast liver and gallbladder. Pancreas: Negative. Spleen: Chronic splenic cysts which were demonstrated by ultrasound in 2011, benign. Simple fluid density on CT today. Adrenals/Urinary Tract: Negative adrenal glands. Negative noncontrast kidneys. No dilated ureters. There is inflammation along the distal course of the left ureter as below. Stomach/Bowel: Fluid-filled rectum and distal sigmoid colon. Confluent inflammation affecting the proximal sigmoid in an area of about 5 centimeters with underlying diverticulosis. Confluent mesenteric stranding (series 3, image 66 and coronal image 58). And thickening of the adjacent left lateral pelvic fossa. Furthermore, there is at least trace extraluminal gas demonstrated on series 3, image 68 and coronal image 57, plus a nearby 15 millimeter gas and intermediate density area which might also be a small contained perforation (coronal image 55). And additionally, there is trace pneumoperitoneum along the anterior superior liver seen on series 3, image 12 and coronal image 45. No free fluid. Fluid throughout the upstream descending colon which also demonstrates diverticulosis. Fluid in the transverse and right colon also with diverticula. Oral contrast was also administered and has just reached the cecum. Upper limits of normal appendix suspected on series 3, image 51. Some of the small bowel in the lower abdomen also appears mildly inflamed (series 3, image 62). And small bowel is mildly dilated throughout the abdomen. The stomach and duodenum have a more normal appearance. Vascular/Lymphatic: Vascular patency is not evaluated in the absence of IV contrast. Minimal calcified aortic atherosclerosis. No lymphadenopathy. Reproductive: Surgically absent uterus. Diminutive or absent ovaries. Other: No pelvic free fluid.  Musculoskeletal: No acute osseous abnormality identified. IMPRESSION: 1. Perforated acute diverticulitis of the proximal sigmoid colon. There is both a small area of contained perforation along the anterior inferior sigmoid mesentery (series 3, image 68), but also trace pneumoperitoneum over the liver. 2. Secondary appearing inflammation of distal small bowel, with a mild generalized ileus or bowel obstruction. 3. Non-acute findings: Mild cardiomegaly. Right middle lobe bronchiectasis and bilateral lung base atelectasis or scarring. Chronic benign splenic cysts. Electronically Signed: By: Genevie Ann M.D. On: 12/01/2018 02:46     Kalman Drape, [facility] Surgery Pager [phone] Cristine Polio, & Friday 7:00am - 4:30pm Thursdays 7:00am -11:30am  Consults: [phone]

## 2018-12-03 LAB — BASIC METABOLIC PANEL
Anion gap: 10 (ref 5–15)
BUN: 8 mg/dL (ref 8–23)
CO2: 23 mmol/L (ref 22–32)
Calcium: 8.3 mg/dL — ABNORMAL LOW (ref 8.9–10.3)
Chloride: 105 mmol/L (ref 98–111)
Creatinine, Ser: 0.72 mg/dL (ref 0.44–1.00)
GFR calc Af Amer: >60 mL/min (ref >60)
GFR calc non Af Amer: >60 mL/min (ref >60)
Glucose, Bld: 109 mg/dL — ABNORMAL HIGH (ref 70–99)
Potassium: 3 mmol/L — ABNORMAL LOW (ref 3.5–5.1)
Sodium: 138 mmol/L (ref 135–145)

## 2018-12-03 LAB — CBC
HCT: 31.7 % — ABNORMAL LOW (ref 36.0–46.0)
Hemoglobin: 10 g/dL — ABNORMAL LOW (ref 12.0–15.0)
MCH: 27.2 pg (ref 26.0–34.0)
MCHC: 31.5 g/dL (ref 30.0–36.0)
MCV: 86.4 fL (ref 80.0–100.0)
Platelets: 209 10*3/uL (ref 150–400)
RBC: 3.67 MIL/uL — ABNORMAL LOW (ref 3.87–5.11)
RDW: 13.4 % (ref 11.5–15.5)
WBC: 8.9 10*3/uL (ref 4.0–10.5)
nRBC: 0 % (ref 0.0–0.2)

## 2018-12-03 LAB — URINALYSIS, ROUTINE W REFLEX MICROSCOPIC
Bilirubin Urine: NEGATIVE
Glucose, UA: NEGATIVE mg/dL
Hgb urine dipstick: NEGATIVE
Ketones, ur: NEGATIVE mg/dL
Leukocytes,Ua: NEGATIVE
Nitrite: NEGATIVE
Protein, ur: NEGATIVE mg/dL
Specific Gravity, Urine: 1.005 (ref 1.005–1.030)
pH: 8 (ref 5.0–8.0)

## 2018-12-03 LAB — GLUCOSE, CAPILLARY: Glucose-Capillary: 118 mg/dL — ABNORMAL HIGH (ref 70–99)

## 2018-12-03 LAB — MAGNESIUM: Magnesium: 1.7 mg/dL (ref 1.7–2.4)

## 2018-12-03 MED ORDER — DIAZEPAM 5 MG PO TABS: 10.0000 mg | ORAL_TABLET | Freq: Every day | ORAL | Status: DC | PRN

## 2018-12-03 MED ORDER — MAGNESIUM SULFATE IN D5W 1-5 GM/100ML-% IV SOLN
1.0000 g | Freq: Once | INTRAVENOUS | Status: AC
  Administered 2018-12-03: 1 g via INTRAVENOUS
  Filled 2018-12-03: qty 100

## 2018-12-03 MED ORDER — IBUPROFEN 800 MG PO TABS: 800.0000 mg | ORAL_TABLET | Freq: Two times a day (BID) | ORAL | Status: DC | PRN

## 2018-12-03 MED ORDER — MAGNESIUM SULFATE 50 % IJ SOLN
1.0000 g | Freq: Once | INTRAMUSCULAR | Status: DC
  Filled 2018-12-03: qty 2

## 2018-12-03 MED ORDER — CARVEDILOL 12.5 MG PO TABS
12.5000 mg | ORAL_TABLET | Freq: Two times a day (BID) | ORAL | Status: DC
  Administered 2018-12-03 – 2018-12-04 (×3): 12.5 mg via ORAL
  Filled 2018-12-03 (×3): qty 1

## 2018-12-03 MED ORDER — AMILORIDE HCL 5 MG PO TABS
5.0000 mg | ORAL_TABLET | Freq: Every day | ORAL | Status: DC
  Administered 2018-12-03 – 2018-12-04 (×2): 5 mg via ORAL
  Filled 2018-12-03 (×2): qty 1

## 2018-12-03 MED ORDER — POTASSIUM CHLORIDE CRYS ER 20 MEQ PO TBCR: 40.0000 meq | EXTENDED_RELEASE_TABLET | Freq: Once | ORAL | Status: DC

## 2018-12-03 MED ORDER — ROSUVASTATIN CALCIUM 20 MG PO TABS
20.0000 mg | ORAL_TABLET | Freq: Every day | ORAL | Status: DC
  Administered 2018-12-03: 20 mg via ORAL
  Filled 2018-12-03 (×2): qty 1

## 2018-12-03 MED ORDER — POTASSIUM CHLORIDE 10 MEQ/100ML IV SOLN
10.0000 meq | INTRAVENOUS | Status: AC
  Administered 2018-12-03 (×4): 10 meq via INTRAVENOUS
  Filled 2018-12-03 (×3): qty 100

## 2018-12-03 MED ORDER — MONTELUKAST SODIUM 10 MG PO TABS
10.0000 mg | ORAL_TABLET | Freq: Every day | ORAL | Status: DC
  Administered 2018-12-03: 10 mg via ORAL
  Filled 2018-12-03: qty 1

## 2018-12-03 MED ORDER — OXYCODONE HCL 5 MG PO TABS
5.0000 mg | ORAL_TABLET | ORAL | Status: DC | PRN
  Filled 2018-12-03: qty 2

## 2018-12-03 MED ORDER — DILTIAZEM HCL 60 MG PO TABS
240.0000 mg | ORAL_TABLET | Freq: Every day | ORAL | Status: DC
  Administered 2018-12-03 – 2018-12-04 (×2): 240 mg via ORAL
  Filled 2018-12-03 (×2): qty 4

## 2018-12-03 MED ORDER — HYDRALAZINE HCL 25 MG PO TABS
25.0000 mg | ORAL_TABLET | Freq: Three times a day (TID) | ORAL | Status: DC
  Administered 2018-12-03 – 2018-12-04 (×4): 25 mg via ORAL
  Filled 2018-12-03 (×4): qty 1

## 2018-12-03 MED ORDER — PANTOPRAZOLE SODIUM 40 MG PO TBEC
40.0000 mg | DELAYED_RELEASE_TABLET | Freq: Every day | ORAL | Status: DC
  Administered 2018-12-03 – 2018-12-04 (×2): 40 mg via ORAL
  Filled 2018-12-03 (×2): qty 1

## 2018-12-03 MED ORDER — ASPIRIN EC 81 MG PO TBEC
81.0000 mg | DELAYED_RELEASE_TABLET | Freq: Every day | ORAL | Status: DC
  Administered 2018-12-03 – 2018-12-04 (×2): 81 mg via ORAL
  Filled 2018-12-03 (×2): qty 1

## 2018-12-03 MED ORDER — POTASSIUM CHLORIDE ER 10 MEQ PO TBCR
30.0000 meq | EXTENDED_RELEASE_TABLET | Freq: Every day | ORAL | Status: DC
  Filled 2018-12-03: qty 3

## 2018-12-03 MED ORDER — CLONIDINE HCL 0.1 MG PO TABS
0.1000 mg | ORAL_TABLET | Freq: Every day | ORAL | Status: DC
  Administered 2018-12-03: 0.1 mg via ORAL
  Filled 2018-12-03: qty 1

## 2018-12-03 MED ORDER — ALBUTEROL SULFATE (2.5 MG/3ML) 0.083% IN NEBU: 2.5000 mg | INHALATION_SOLUTION | RESPIRATORY_TRACT | Status: DC | PRN

## 2018-12-03 MED ORDER — DILTIAZEM HCL 60 MG PO TABS
120.0000 mg | ORAL_TABLET | Freq: Every day | ORAL | Status: DC
  Administered 2018-12-03: 120 mg via ORAL
  Filled 2018-12-03: qty 2

## 2018-12-03 MED ORDER — POTASSIUM CHLORIDE 10 MEQ/100ML IV SOLN
10.0000 meq | INTRAVENOUS | Status: DC
  Administered 2018-12-03: 10 meq via INTRAVENOUS
  Filled 2018-12-03: qty 100

## 2018-12-03 MED ORDER — ZINC SULFATE 220 (50 ZN) MG PO CAPS
220.0000 mg | ORAL_CAPSULE | Freq: Every day | ORAL | Status: DC
  Filled 2018-12-03 (×2): qty 1

## 2018-12-03 MED ORDER — POTASSIUM CHLORIDE ER 10 MEQ PO TBCR
30.0000 meq | EXTENDED_RELEASE_TABLET | Freq: Every day | ORAL | Status: DC
  Administered 2018-12-03: 30 meq via ORAL
  Filled 2018-12-03 (×3): qty 3

## 2018-12-03 MED ORDER — TRAMADOL HCL 50 MG PO TABS
50.0000 mg | ORAL_TABLET | Freq: Two times a day (BID) | ORAL | Status: DC | PRN
  Administered 2018-12-04: 50 mg via ORAL
  Filled 2018-12-03: qty 1

## 2018-12-03 NOTE — Plan of Care (Signed)
  Problem: Education: Goal: Knowledge of General Education information will improve Description: Including pain rating scale, medication(s)/side effects and non-pharmacologic comfort measures Outcome: Progressing   Problem: Health Behavior/Discharge Planning: Goal: Ability to manage health-related needs will improve Outcome: Progressing   Problem: Activity: Goal: Risk for activity intolerance will decrease Outcome: Progressing   

## 2018-12-03 NOTE — Progress Notes (Signed)
5 runs of iv potassium administered today for hypokalemia

## 2018-12-03 NOTE — Progress Notes (Signed)
PROGRESS NOTE    Brooke Wilson  ZOX:[id number] DOB: 11-03-1955 DOA: 11/30/2018 PCP: Myles LippsSantiago, Irma M, MD   Brief Narrative:  HPI on 12/01/2018 by Dr. Trixie DredgeXilin NiuEvangeline Wilson is a 63 y.o. female with medical history significant of hypertension, hyperlipidemia, asthma, GERD, anxiety, sinusitis, CKD stage II, pelvic floor relaxation, who presents with abdominal pain.  Patient states that her abdominal pain started last night, which is located mainly in lower abdomen, also involving whole abdomen, constant, sharp, 10 out of 10 severity, nonradiating.  Patient denies nausea vomiting, diarrhea.  Patient does not have chills or subjective fever, but her temperature is 100.1 in ED.  Patient does not have chest pain, shortness of breath, cough.  No symptoms of UTI or unilateral weakness.  Interim history Admitted for sepsis with perforated sigmoid colon.  On antibiotics. General surgery following.  Assessment & Plan   Sepsis secondary to perforated sigmoid colon -Patient presented with leukocytosis and tachycardia, fever.  -sepsis morphology improving, leukocytosis trending downward, currently 8.9.  Patient no longer tachycardic or febrile -CT abdomen pelvis shows perforated acute diverticulitis of the proximal sigmoid colon.  Small area of contained perforation along the anterior inferior sigmoid mesentery but also trace pneumoperitoneum over the liver.  Secondary appearing inflammation of the distal small bowel with a mild generalized ileus or bowel obstruction. -General surgery consulted and appreciated -Blood cultures show no growth -Placed on liquid diet -pain appears to be improving  -Continue IV fluids along with Zosyn, pain control as well as antiemetics as needed  Hyperglycemia -Patient with hypoglycemic episode- now stable  -placed on liquid diet -will discontinue D5 1/2 NS  Essential hypertension -Home meds restarted  Hyperlipidemia -Continue statin  GERD -Continue  famotidine and plavix  Asthma -Stable, no wheezing -Continue albuterol as needed  Anxiety -Continue diazepam PRN  Mild acute kidney injury -Creatinine on admission 1.17, baseline appears to be approximately 0.7 -Creatinine improved to 0.72 -Continue IV fluids and monitor BMP  Eye irritation -Continueeye drops  IV access -patient requested midline  Hypokalemia -Will replace and continue to monitor BMP -obtain magnesium level  DVT Prophylaxis  SCDs  Code Status: Full  Family Communication: None at bedside  Disposition Plan: Admitted. Pending improvement in abdominal pain- likely home on 10/19  Consultants General surgery  Procedures  None  Antibiotics   Anti-infectives (From admission, onward)   Start     Dose/Rate Route Frequency Ordered Stop   12/01/18 0800  piperacillin-tazobactam (ZOSYN) IVPB 3.375 g     3.375 g 12.5 mL/hr over 240 Minutes Intravenous Every 8 hours 12/01/18 0347     12/01/18 0300  piperacillin-tazobactam (ZOSYN) IVPB 3.375 g     3.375 g 100 mL/hr over 30 Minutes Intravenous  Once 12/01/18 0248 12/01/18 0438      Subjective:   Brooke BreamEvangeline Self seen and examined today.  Feels abdominal pain has improved, however complains of lower abdominal pain, moreso with urination. Does not remember providing urine sample upon admission. Denies current chest pain, shortness of breath, nausea or vomiting, dizziness or headache.  Hoping to go home on 10/19 as [birth year] her birthday. Objective:   Vitals:   12/02/18 0455 12/02/18 1458 12/02/18 2145 12/03/18 0509  BP: 133/66 126/62 128/62 (!) 144/76  Pulse: 85 77 81 80  Resp: 18 18 18 18   Temp: 98 F (36.7 C) 98.9 F (37.2 C) 99.2 F (37.3 C) 99 F (37.2 C)  TempSrc: Oral Oral Oral Oral  SpO2: 97% 96% 95% 97%  Weight:  Height:        Intake/Output Summary (Last 24 hours) at 12/03/2018 1322 Last data filed at 12/03/2018 0900 Gross per 24 hour  Intake 1922.81 ml  Output -  Net 1922.81 ml    Filed Weights   11/30/18 1819 12/01/18 1955  Weight: 68.9 kg 68.9 kg   Exam  General: Well developed, well nourished, NAD, appears stated age  45: NCAT, mucous membranes moist.   Cardiovascular: S1 S2 auscultated, RRR, no murmur  Respiratory: Clear to auscultation bilaterally   Abdomen: Soft, nontender, nondistended, + bowel sounds  Extremities: warm dry without cyanosis clubbing or edema  Neuro: AAOx3, nonfocal  Psych: Normal affect and demeanor   Data Reviewed: I have personally reviewed following labs and imaging studies  CBC: Recent Labs  Lab 11/30/18 1825 12/01/18 0613 12/02/18 0319 12/03/18 0336  WBC 22.4* 20.8* 15.8* 8.9  HGB 12.3 12.1 10.4* 10.0*  HCT 39.4 39.2 33.4* 31.7*  MCV 88.7 90.1 89.1 86.4  PLT 220 212 190 938   Basic Metabolic Panel: Recent Labs  Lab 11/30/18 1825 12/01/18 0613 12/02/18 0319 12/03/18 0336  NA 137 138 137 138  K 4.8 4.3 3.9 3.0*  CL 108 107 108 105  CO2 18* 18* 18* 23  GLUCOSE 115* 121* 68* 109*  BUN 28* 23 20 8   CREATININE 1.17* 1.02* 0.85 0.72  CALCIUM 9.1 8.7* 8.4* 8.3*  MG  --   --   --  1.7   GFR: Estimated Creatinine Clearance: 61.6 mL/min (by C-G formula based on SCr of 0.72 mg/dL). Liver Function Tests: Recent Labs  Lab 11/30/18 1825  AST 16  ALT 20  ALKPHOS 88  BILITOT 1.1  PROT 6.7  ALBUMIN 3.4*   Recent Labs  Lab 11/30/18 1825  LIPASE 30   No results for input(s): AMMONIA in the last 168 hours. Coagulation Profile: Recent Labs  Lab 12/01/18 0412  INR 1.2   Cardiac Enzymes: No results for input(s): CKTOTAL, CKMB, CKMBINDEX, TROPONINI in the last 168 hours. BNP (last 3 results) No results for input(s): PROBNP in the last 8760 hours. HbA1C: No results for input(s): HGBA1C in the last 72 hours. CBG: Recent Labs  Lab 12/01/18 0744 12/02/18 0748 12/02/18 0832 12/02/18 1628 12/03/18 0811  GLUCAP 105* 64* 126* 84 118*   Lipid Profile: No results for input(s): CHOL, HDL, LDLCALC,  TRIG, CHOLHDL, LDLDIRECT in the last 72 hours. Thyroid Function Tests: No results for input(s): TSH, T4TOTAL, FREET4, T3FREE, THYROIDAB in the last 72 hours. Anemia Panel: No results for input(s): VITAMINB12, FOLATE, FERRITIN, TIBC, IRON, RETICCTPCT in the last 72 hours. Urine analysis:    Component Value Date/Time   COLORURINE STRAW (A) 12/03/2018 1057   APPEARANCEUR CLEAR 12/03/2018 1057   LABSPEC 1.005 12/03/2018 1057   PHURINE 8.0 12/03/2018 1057   GLUCOSEU NEGATIVE 12/03/2018 1057   HGBUR NEGATIVE 12/03/2018 [place] 12/03/2018 1057   BILIRUBINUR negative 11/13/2016 1608   BILIRUBINUR NEG 05/17/2011 0857   KETONESUR NEGATIVE 12/03/2018 1057   PROTEINUR NEGATIVE 12/03/2018 1057   UROBILINOGEN 0.2 11/13/2016 1608   NITRITE NEGATIVE 12/03/2018 1057   LEUKOCYTESUR NEGATIVE 12/03/2018 1057   Sepsis Labs: @LABRCNTIP (procalcitonin:4,lacticidven:4)  ) Recent Results (from the past 240 hour(s))  SARS CORONAVIRUS 2 (TAT 6-24 HRS) Nasopharyngeal Nasopharyngeal Swab     Status: None   Collection Time: 12/01/18  3:35 AM   Specimen: Nasopharyngeal Swab  Result Value Ref Range Status   SARS Coronavirus 2 NEGATIVE NEGATIVE Final    Comment: (  NOTE) SARS-CoV-2 target nucleic acids are NOT DETECTED. The SARS-CoV-2 RNA is generally detectable in upper and lower respiratory specimens during the acute phase of infection. Negative results do not preclude SARS-CoV-2 infection, do not rule out co-infections with other pathogens, and should not be used as the sole basis for treatment or other patient management decisions. Negative results must be combined with clinical observations, patient history, and epidemiological information. The expected result is Negative. Fact Sheet for Patients: HairSlick.no Fact Sheet for Healthcare Providers: quierodirigir.com This test is not yet approved or cleared by the [place] FDA  and  has been authorized for detection and/or diagnosis of SARS-CoV-2 by FDA under an Emergency Use Authorization (EUA). This EUA will remain  in effect (meaning this test can be used) for the duration of the COVID-19 declaration under Section 56 4(b)(1) of the Act, 21 U.S.C. section 360bbb-3(b)(1), unless the authorization is terminated or revoked sooner. Performed at [facility], [address], [place], [place] [address]   Culture, blood (x 2)     Status: None (Preliminary result)   Collection Time: 12/01/18  6:21 AM   Specimen: BLOOD LEFT HAND  Result Value Ref Range Status   Specimen Description BLOOD LEFT HAND  Final   Special Requests   Final    BOTTLES DRAWN AEROBIC AND ANAEROBIC Blood Culture adequate volume   Culture   Final    NO GROWTH 2 DAYS Performed at [facility], [address], [place], [place] [address]    Report Status PENDING  Incomplete  Culture, blood (x 2)     Status: None (Preliminary result)   Collection Time: 12/01/18  6:21 AM   Specimen: BLOOD LEFT HAND  Result Value Ref Range Status   Specimen Description BLOOD LEFT HAND  Final   Special Requests   Final    BOTTLES DRAWN AEROBIC AND ANAEROBIC Blood Culture results may not be optimal due to an inadequate volume of blood received in culture bottles   Culture   Final    NO GROWTH 2 DAYS Performed at [facility], [address] [place], [place] [address]    Report Status PENDING  Incomplete      Radiology Studies: No results found.   Scheduled Meds: . aMILoride  5 mg Oral Daily  . aspirin EC  81 mg Oral Daily  . carvedilol  12.5 mg Oral BID  . cloNIDine  0.1 mg Oral QHS  . diltiazem  120 mg Oral QHS  . diltiazem  240 mg Oral Daily  . famotidine  40 mg Oral Daily  . fluticasone  2 spray Each Nare Daily  . hydrALAZINE  25 mg Oral TID  . latanoprost  1 drop Both Eyes BID  . mouth rinse  15 mL Mouth Rinse BID  . montelukast  10 mg Oral QHS  . pantoprazole  40 mg Oral  Daily  . potassium chloride  30 mEq Oral QHS  . rosuvastatin  20 mg Oral Daily  . sodium chloride flush  10-40 mL Intracatheter Q12H  . zinc sulfate  220 mg Oral Daily   Continuous Infusions: . dextrose 5 % and 0.45% NaCl 75 mL/hr at 12/02/18 2350  . magnesium sulfate    . piperacillin-tazobactam (ZOSYN)  IV 3.375 g (12/03/18 1216)  . potassium chloride 10 mEq (12/03/18 1025)     LOS: 2 days   Time Spent in minutes   30 minutes  Abreanna Drawdy D.O. on 12/03/2018 at 1:22  PM  Between 7am to 7pm - Please see pager noted on [email or web address]  After 7pm go to [email or web address]  And look for the night coverage person covering for me after hours  [facility] Office  [phone]

## 2018-12-03 NOTE — Progress Notes (Signed)
[facility] [place] [facility]/Trauma Progress Note      Assessment/Plan HTN Asthma GERD HLD Prediabetic  Sigmoid diverticulitis with microperforation, small amount of free air - Continue medical management at this time.  IV fluids, bowel rest, antibiotics - If patient worsens or does not improve she will need surgical intervention - pain improved today, will start on CLD - We will follow closely  FEN: CLD VTE: SCD's, lovenox okay from our standpoint ID: Zosyn 10/16>>   WBC 8.9, afebrile Foley: none Follow up: TBD    LOS: 2 days    Subjective: CC: abdominal pain  Pain is better today. No issues overnight. Pt wants to go home. She also wants a repeat CT scan before she leaves. I explained to her that a CT scan may not be indicated if she is doing well clinically.   Objective: Vital signs in last 24 hours: Temp:  [98.9 F (37.2 C)-99.2 F (37.3 C)] 99 F (37.2 C) (10/18 0509) Pulse Rate:  [77-81] 80 (10/18 0509) Resp:  [18] 18 (10/18 0509) BP: (126-144)/(62-76) 144/76 (10/18 0509) SpO2:  [95 %-97 %] 97 % (10/18 0509) Last BM Date: 12/02/18  Intake/Output from previous day: 10/17 0701 - 10/18 0700 In: 1622.8 [P.O.:50; I.V.:1404.3; IV Piggyback:168.5] Out: -  Intake/Output this shift: No intake/output data recorded.  PE: Gen:  Alert, NAD, pleasant, cooperative Pulm: Rate and effort normal Abd: Soft, ND, no HSM, mild TTP in lower abdomen without guarding, no peritonitis Skin: no rashes noted, warm and dry  Anti-infectives: Anti-infectives (From admission, onward)   Start     Dose/Rate Route Frequency Ordered Stop   12/01/18 0800  piperacillin-tazobactam (ZOSYN) IVPB 3.375 g     3.375 g 12.5 mL/hr over 240 Minutes Intravenous Every 8 hours 12/01/18 0347     12/01/18 0300  piperacillin-tazobactam (ZOSYN) IVPB 3.375 g     3.375 g 100 mL/hr over 30 Minutes Intravenous  Once 12/01/18 0248 12/01/18 0438      Lab Results:  Recent Labs    12/02/18 0319  12/03/18 0336  WBC 15.8* 8.9  HGB 10.4* 10.0*  HCT 33.4* 31.7*  PLT 190 209   BMET Recent Labs    12/02/18 0319 12/03/18 0336  NA 137 138  K 3.9 3.0*  CL 108 105  CO2 18* 23  GLUCOSE 68* 109*  BUN 20 8  CREATININE 0.85 0.72  CALCIUM 8.4* 8.3*   PT/INR Recent Labs    12/01/18 0412  LABPROT 14.7  INR 1.2   CMP     Component Value Date/Time   NA 138 12/03/2018 0336   NA 143 09/05/2018 0816   K 3.0 (L) 12/03/2018 0336   CL 105 12/03/2018 0336   CO2 23 12/03/2018 0336   GLUCOSE 109 (H) 12/03/2018 0336   BUN 8 12/03/2018 0336   BUN 15 09/05/2018 0816   CREATININE 0.72 12/03/2018 0336   CREATININE 0.63 06/02/2015 0950   CALCIUM 8.3 (L) 12/03/2018 0336   PROT 6.7 11/30/2018 1825   PROT 6.2 09/05/2018 0816   ALBUMIN 3.4 (L) 11/30/2018 1825   ALBUMIN 4.3 09/05/2018 0816   AST 16 11/30/2018 1825   ALT 20 11/30/2018 1825   ALKPHOS 88 11/30/2018 1825   BILITOT 1.1 11/30/2018 1825   BILITOT 0.2 09/05/2018 0816   GFRNONAA >60 12/03/2018 0336   GFRNONAA >89 03/17/2014 0951   GFRAA >60 12/03/2018 0336   GFRAA >89 03/17/2014 0951   Lipase     Component Value Date/Time   LIPASE 30 11/30/2018 1825  Studies/Results: No results found.   Jerre Simon, [facility] Surgery Pager [phone] [place]oral[place] Chestnut, & Friday 7:00am - 4:30pm Thursdays 7:00am -11:30am

## 2018-12-04 ENCOUNTER — Encounter (HOSPITAL_COMMUNITY): Payer: Self-pay

## 2018-12-04 LAB — BASIC METABOLIC PANEL
Anion gap: 8 (ref 5–15)
BUN: 8 mg/dL (ref 8–23)
CO2: 23 mmol/L (ref 22–32)
Calcium: 9.1 mg/dL (ref 8.9–10.3)
Chloride: 110 mmol/L (ref 98–111)
Creatinine, Ser: 0.97 mg/dL (ref 0.44–1.00)
GFR calc Af Amer: >60 mL/min (ref >60)
GFR calc non Af Amer: >60 mL/min (ref >60)
Glucose, Bld: 121 mg/dL — ABNORMAL HIGH (ref 70–99)
Potassium: 4 mmol/L (ref 3.5–5.1)
Sodium: 141 mmol/L (ref 135–145)

## 2018-12-04 LAB — CBC WITH DIFFERENTIAL/PLATELET
Abs Immature Granulocytes: 0.06 10*3/uL (ref 0.00–0.07)
Basophils Absolute: 0 10*3/uL (ref 0.0–0.1)
Basophils Relative: 1 %
Eosinophils Absolute: 0.2 10*3/uL (ref 0.0–0.5)
Eosinophils Relative: 3 %
HCT: 33.7 % — ABNORMAL LOW (ref 36.0–46.0)
Hemoglobin: 10.7 g/dL — ABNORMAL LOW (ref 12.0–15.0)
Immature Granulocytes: 1 %
Lymphocytes Relative: 18 %
Lymphs Abs: 1.2 10*3/uL (ref 0.7–4.0)
MCH: 27.4 pg (ref 26.0–34.0)
MCHC: 31.8 g/dL (ref 30.0–36.0)
MCV: 86.2 fL (ref 80.0–100.0)
Monocytes Absolute: 0.7 10*3/uL (ref 0.1–1.0)
Monocytes Relative: 11 %
Neutro Abs: 4.4 10*3/uL (ref 1.7–7.7)
Neutrophils Relative %: 66 %
Platelets: 239 10*3/uL (ref 150–400)
RBC: 3.91 MIL/uL (ref 3.87–5.11)
RDW: 13.9 % (ref 11.5–15.5)
WBC: 6.6 10*3/uL (ref 4.0–10.5)
nRBC: 0 % (ref 0.0–0.2)

## 2018-12-04 LAB — GLUCOSE, CAPILLARY: Glucose-Capillary: 114 mg/dL — ABNORMAL HIGH (ref 70–99)

## 2018-12-04 MED ORDER — AMOXICILLIN-POT CLAVULANATE 875-125 MG PO TABS: 1.0000 | ORAL_TABLET | Freq: Two times a day (BID) | ORAL | 0 refills | Status: AC

## 2018-12-04 MED ORDER — TRAMADOL HCL 50 MG PO TABS: 50.0000 mg | ORAL_TABLET | Freq: Two times a day (BID) | ORAL | 0 refills | Status: DC | PRN

## 2018-12-04 NOTE — Plan of Care (Signed)
  Problem: Pain Managment: Goal: General experience of comfort will improve Outcome: Progressing   Problem: Safety: Goal: Ability to remain free from injury will improve Outcome: Progressing   Problem: Skin Integrity: Goal: Risk for impaired skin integrity will decrease Outcome: Progressing   

## 2018-12-04 NOTE — Discharge Instructions (Signed)
Low-Fiber Eating Plan Follow for 5 weeks then eat a high fiber diet Take miralax as needed for mild constipation Fiber is found in fruits, vegetables, whole grains, and beans. Eating a diet low in fiber helps to reduce how often you have bowel movements and how much you produce during a bowel movement. A low-fiber eating plan may help your digestive system heal if:  You have certain conditions, such as Crohn's disease or diverticulitis.  You recently had radiation therapy on your pelvis or bowel.  You recently had intestinal surgery.  You have a new surgical opening in your abdomen (colostomy or ileostomy).  Your intestine is narrowed (stricture). Your health care provider will determine how long you need to stay on this diet. Your health care provider may recommend that you work with a diet and nutrition specialist (dietitian). What are tips for following this plan? General guidelines  Follow recommendations from your dietitian about how much fiber you should have each day.  Most people on this eating plan should try to eat less than 10 grams (g) of fiber each day. Your daily fiber goal is _________________ g.  Take vitamin and mineral supplements as told by your health care provider or dietitian. Chewable or liquid forms are best when on this eating plan. Reading food labels  Check food labels for the amount of dietary fiber.  Choose foods that have less than 2 grams of fiber in one serving. Cooking  Use white flour and other allowed grains for baking and cooking.  Cook meat using methods that keep it tender, such as braising or poaching.  Cook eggs until the yolk is completely solid.  Cook with healthy oils, such as olive oil or canola oil. Meal planning   Eat 5-6 small meals throughout the day instead of 3 large meals.  If you are lactose intolerant: ? Choose low-lactose dairy foods. ? Do not eat dairy foods, if told by your dietitian.  Limit fat and oils to less  than 8 teaspoons a day.  Eat small portions of desserts. What foods are allowed? The items listed below may not be a complete list. Talk with your dietitian about what dietary choices are best for you. Grains All bread and crackers made with white flour. Waffles, pancakes, and [place] toast. Bagels. Pretzels. Melba toast, zwieback, and matzoh. Cooked and dried cereals that do not contain whole grains, added fiber, seeds, or dried fruit. CornmealDenzil Magnuson. Hot and cold cereals made with refined corn, wheat, rice, or oats. Plain pasta and noodles. White rice. Vegetables Well-cooked or canned vegetables without skin, seeds, or stems. Cooked potatoes without skins. Vegetable juice. Fruits Soft-cooked or canned fruits without skin and seeds. Peeled ripe banana. Applesauce. Fruit juice without pulp. Meats and other protein foods Ground meat. Tender cuts of meat or poultry. Eggs. Fish, seafood, and shellfish. Smooth nut butters. Tofu. Dairy All milk products and drinks. Lactose-free milks, including rice, soy, and almond milks. Yogurt without fruit, nuts, chocolate, or granola mix-ins. Sour cream. Cottage cheese. Cheese. Beverages Decaf coffee. Fruit and vegetable juices or smoothies (in small amounts, with no pulp or skins, and with fruits from allowed list). Sports drinks. Herbal tea. Fats and oils Olive oil, canola oil, sunflower oil, flaxseed oil, and grapeseed oil. Mayonnaise. Cream cheese. Margarine. Butter. Sweets and desserts Plain cakes and cookies. Cream pies and pies made with allowed fruits. Pudding. Custard. Fruit gelatin. Sherbet. Popsicles. Ice cream without nuts. Plain hard candy. Honey. Jelly. Molasses. Syrups, including chocolate syrup. Chocolate. Marshmallows.  Gumdrops. Seasoning and other foods Bouillon. Broth. Cream soups made from allowed foods. Strained soup. Casseroles made with allowed foods. Ketchup. Mild mustard. Mild salad dressings. Plain gravies. Vinegar. Spices in  moderation. Salt. Sugar. What foods are not allowed? The items listed below may not be a complete list. Talk with your dietitian about what dietary choices are best for you. Grains Whole wheat and whole grain breads and crackers. Multigrain breads and crackers. Rye bread. Whole grain or multigrain cereals. Cereals with nuts, raisins, or coconut. Bran. Coarse wheat cereals. Granola. High-fiber cereals. Cornmeal or corn bread. Whole grain pasta. Wild or brown rice. Quinoa. Popcorn. Buckwheat. Wheat germ. Vegetables Potato skins. Raw or undercooked vegetables. All beans and bean sprouts. Cooked greens. Corn. Peas. Cabbage. Beets. Broccoli. Brussels sprouts. Cauliflower. Mushrooms. Onions. Peppers. Parsnips. Okra. Sauerkraut. Fruit Raw or dried fruit. Berries. Fruit juice with pulp. Prune juice. Meats and other protein foods Tough, fibrous meats with gristle. Fatty meat. Poultry with skin. Fried meat, Sales executive, or fish. Deli or lunch meats. Sausage, bacon, and hot dogs. Nuts and chunky nut butter. Dried peas, beans, and lentils. Dairy Yogurt with fruit, nuts, chocolate, or granola mix-ins. Beverages Caffeinated coffee and teas. Fats and oils Avocado. Coconut. Sweets and desserts Desserts, cookies, or candies that contain nuts or coconut. Dried fruit. Jams and preserves with seeds. Marmalade. Any dessert made with fruits or grains that are not allowed. Seasoning and other foods Corn tortilla chips. Soups made with vegetables or grains that are not allowed. Relish. Horseradish. Angie Fava. Olives. Summary  Most people on a low-fiber eating plan should eat less than 10 grams of fiber a day. Follow recommendations from your dietitian about how much fiber you should have each day.  Always check food labels to see the dietary fiber content of packaged foods. In general, a low-fiber food will have fewer than 2 grams of fiber per serving.  In general, try to avoid whole grains, raw fruits and vegetables,  dried fruit, tough cuts of meat, nuts, and seeds.  Take a vitamin and mineral supplement as told by your health care provider or dietitian. This information is not intended to replace advice given to you by your health care provider. Make sure you discuss any questions you have with your health care provider. Document Released: 07/24/2001 Document Revised: 05/26/2018 Document Reviewed: 04/06/2016 Elsevier Patient Education  2020 [employer].    Diverticulitis  Diverticulitis is infection or inflammation of small pouches (diverticula) in the colon that form due to a condition called diverticulosis. Diverticula can trap stool (feces) and bacteria, causing infection and inflammation. Diverticulitis may cause severe stomach pain and diarrhea. It may lead to tissue damage in the colon that causes bleeding. The diverticula may also burst (rupture) and cause infected stool to enter other areas of the abdomen. Complications of diverticulitis can include:  Bleeding.  Severe infection.  Severe pain.  Rupture (perforation) of the colon.  Blockage (obstruction) of the colon. What are the causes? This condition is caused by stool becoming trapped in the diverticula, which allows bacteria to grow in the diverticula. This leads to inflammation and infection. What increases the risk? You are more likely to develop this condition if:  You have diverticulosis. The risk for diverticulosis increases if: ? You are overweight or obese. ? You use tobacco products. ? You do not get enough exercise.  You eat a diet that does not include enough fiber. High-fiber foods include fruits, vegetables, beans, nuts, and whole grains. What are the signs or  symptoms? Symptoms of this condition may include:  Pain and tenderness in the abdomen. The pain is normally located on the left side of the abdomen, but it may occur in other areas.  Fever and  chills.  Bloating.  Cramping.  Nausea.  Vomiting.  Changes in bowel routines.  Blood in your stool. How is this diagnosed? This condition is diagnosed based on:  Your medical history.  A physical exam.  Tests to make sure there is nothing else causing your condition. These tests may include: ? Blood tests. ? Urine tests. ? Imaging tests of the abdomen, including X-rays, ultrasounds, MRIs, or CT scans. How is this treated? Most cases of this condition are mild and can be treated at home. Treatment may include:  Taking over-the-counter pain medicines.  Following a clear liquid diet.  Taking antibiotic medicines by mouth.  Rest. More severe cases may need to be treated at a hospital. Treatment may include:  Not eating or drinking.  Taking prescription pain medicine.  Receiving antibiotic medicines through an IV tube.  Receiving fluids and nutrition through an IV tube.  Surgery. When your condition is under control, your health care provider may recommend that you have a colonoscopy. This is an exam to look at the entire large intestine. During the exam, a lubricated, bendable tube is inserted into the anus and then passed into the rectum, colon, and other parts of the large intestine. A colonoscopy can show how severe your diverticula are and whether something else may be causing your symptoms. Follow these instructions at home: Medicines  Take over-the-counter and prescription medicines only as told by your health care provider. These include fiber supplements, probiotics, and stool softeners.  If you were prescribed an antibiotic medicine, take it as told by your health care provider. Do not stop taking the antibiotic even if you start to feel better.  Do not drive or use heavy machinery while taking prescription pain medicine. General instructions   Follow a full liquid diet or another diet as directed by your health care provider. After your symptoms improve,  your health care provider may tell you to change your diet. He or she may recommend that you eat a diet that contains at least 25 g (25 grams) of fiber daily. Fiber makes it easier to pass stool. Healthy sources of fiber include: ? Berries. One cup contains 4-8 grams of fiber. ? Beans or lentils. One half cup contains 5-8 grams of fiber. ? Green vegetables. One cup contains 4 grams of fiber.  Exercise for at least 30 minutes, 3 times each week. You should exercise hard enough to raise your heart rate and break a sweat.  Keep all follow-up visits as told by your health care provider. This is important. You may need a colonoscopy. Contact a health care provider if:  Your pain does not improve.  You have a hard time drinking or eating food.  Your bowel movements do not return to normal. Get help right away if:  Your pain gets worse.  Your symptoms do not get better with treatment.  Your symptoms suddenly get worse.  You have a fever.  You vomit more than one time.  You have stools that are bloody, black, or tarry. Summary  Diverticulitis is infection or inflammation of small pouches (diverticula) in the colon that form due to a condition called diverticulosis. Diverticula can trap stool (feces) and bacteria, causing infection and inflammation.  You are at higher risk for this condition if you  have diverticulosis and you eat a diet that does not include enough fiber.  Most cases of this condition are mild and can be treated at home. More severe cases may need to be treated at a hospital.  When your condition is under control, your health care provider may recommend that you have an exam called a colonoscopy. This exam can show how severe your diverticula are and whether something else may be causing your symptoms. This information is not intended to replace advice given to you by your health care provider. Make sure you discuss any questions you have with your health care  provider. Document Released: 11/11/2004 Document Revised: 01/14/2017 Document Reviewed: 03/06/2016 Elsevier Patient Education  2020 [employer].

## 2018-12-04 NOTE — Discharge Summary (Signed)
Physician Discharge Summary  Brooke Wilson RUE:[id number] DOB: 03-Apr-1955 DOA: 11/30/2018  PCP: Brooke Lipps, MD  Admit date: 11/30/2018 Discharge date: 12/04/2018  Time spent: 45 minutes  Recommendations for Outpatient Follow-up:  Patient will be discharged to home.  Patient will need to follow up with primary care provider within one week of discharge, repeat BMP.  Follow up with surgery and gastroenterology. Patient should continue medications as prescribed.  Patient should follow a soft diet.   Discharge Diagnoses:  Sepsis secondary to perforated sigmoid colon Hyperglycemia Essential hypertension Hyperlipidemia GERD Asthma Anxiety Mild acute kidney injury Eye irritation IV access Hypokalemia  Discharge Condition: Stable  Diet recommendation: soft  Filed Weights   11/30/18 1819 12/01/18 1955  Weight: 68.9 kg 68.9 kg    History of present illness:  on 12/01/2018 by Dr. Lynett Wilson a 63 y.o.femalewith medical history significant ofhypertension, hyperlipidemia, asthma, GERD, anxiety, sinusitis, CKD stage II, pelvic floor relaxation, who presents with abdominal pain.  Patient states that her abdominal pain started last night, which is located mainly in lower abdomen, also involving whole abdomen, constant, sharp, 10 out of 10 severity, nonradiating. Patient denies nausea vomiting, diarrhea. Patient does not have chills or subjective fever, but her temperature is 100.1in ED. Patient does not have chest pain, shortness of breath, cough. No symptoms of UTI or unilateral weakness.  Hospital Course:  Sepsis secondary to perforated sigmoid colon -Patient presented with leukocytosis and tachycardia, fever.  -sepsis morphology improving, leukocytosis trending downward, currently 8.9.  Patient no longer tachycardic or febrile -CT abdomen pelvis shows perforated acute diverticulitis of the proximal sigmoid colon.  Small area of contained  perforation along the anterior inferior sigmoid mesentery but also trace pneumoperitoneum over the liver.  Secondary appearing inflammation of the distal small bowel with a mild generalized ileus or bowel obstruction. -General surgery consulted and appreciated -Blood cultures show no growth -Placed on soft diet -pain appears to be improving  -was on zosyn, will continue Augmentin upon discharge for a total of 2 weeks of antibiotics  Hyperglycemia -Patient with hypoglycemic episode- now stable  -placed on liquid diet -Discontinued D5 1/2 NS  Essential hypertension -Continue home meds  Hyperlipidemia -Continue statin  GERD -Continue famotidine and plavix  Asthma -Stable, no wheezing -Continue albuterol as needed  Anxiety -Continue diazepam PRN  Mild acute kidney injury -Creatinine on admission 1.17, baseline appears to be approximately 0.7 -Creatinine improved to 0.97  Eye irritation -Continue eye drops as needed  IV access -patient requested midline  Hypokalemia -resolved -repeat BMP in one week  Procedures: None  Consultations: General surgery  Discharge Exam: Vitals:   12/03/18 2151 12/04/18 0500  BP: (!) 125/55 118/60  Pulse: 75 75  Resp: 16 16  Temp: 98.6 F (37 C) 97.8 F (36.6 C)  SpO2: 96% 98%     General: Well developed, well nourished, NAD, appears stated age  HEENT: NCAT, mucous membranes moist.  Cardiovascular: S1 S2 auscultated, RRR, no murmur  Respiratory: Clear to auscultation bilaterally   Abdomen: Soft, nontender, nondistended, + bowel sounds  Extremities: warm dry without cyanosis clubbing or edema  Neuro: AAOx3, nonfocal  Psych: Normal affect and demeanor  Discharge Instructions Discharge Instructions    Discharge instructions   Complete by: As directed    Patient will be discharged to home.  Patient will need to follow up with primary care provider within one week of discharge, repeat BMP.  Follow up with  surgery and gastroenterology. Patient should continue medications as prescribed.  Patient should follow a soft diet.     Allergies as of 12/04/2018      Reactions   Ace Inhibitors Cough   Iodinated Diagnostic Agents Hives   Chills   Lisinopril Cough   Sulfa Antibiotics Hives   Bactrim Rash   Flexeril [cyclobenzaprine Hcl] Nausea Only   Latex Itching   Naprosyn [naproxen] Nausea And Vomiting   And high doses of NSAIDS      Medication List    TAKE these medications   albuterol 108 (90 Base) MCG/ACT inhaler Commonly known as: Proventil HFA INHALE 2 PUFFS INTO THE LUNGS EVERY 6 HOURS AS NEEDED FOR WHEEZING OR SHORTNESS OF BREATH What changed:   how much to take  how to take this  when to take this  reasons to take this  additional instructions   aMILoride 5 MG tablet Commonly known as: MIDAMOR Take 1 tablet (5 mg total) by mouth daily.   amoxicillin-clavulanate 875-125 MG tablet Commonly known as: Augmentin Take 1 tablet by mouth 2 (two) times daily for 11 days.   aspirin 81 MG tablet Take 81 mg by mouth daily.   carvedilol 12.5 MG tablet Commonly known as: COREG Take 1 tablet (12.5 mg total) by mouth 2 (two) times daily.   cloNIDine 0.1 MG tablet Commonly known as: Catapres Take 1 tablet (0.1 mg total) by mouth at bedtime.   diazepam 10 MG tablet Commonly known as: VALIUM TAKE 1 TABLET BY MOUTH EVERY DAY AS NEEDED FOR MUSCLE SPASM What changed: See the new instructions.   diltiazem 120 MG 24 hr capsule Commonly known as: CARDIZEM CD Take 1 capsule (120 mg total) by mouth every morning. What changed: when to take this   diltiazem 240 MG 24 hr capsule Commonly known as: CARDIZEM CD 1 CAPSULE IN EVENING What changed:   how much to take  how to take this  when to take this  additional instructions   famotidine 40 MG tablet Commonly known as: PEPCID Take 1 tablet (40 mg total) by mouth daily.   fluticasone 50 MCG/ACT nasal spray Commonly known  as: FLONASE PLACE 2 SPRAYS IN THE NOSTRIL EVERY DAY What changed:   how much to take  how to take this  when to take this  additional instructions   hydrALAZINE 25 MG tablet Commonly known as: APRESOLINE Take 1 tablet (25 mg total) by mouth 3 (three) times daily.   ibuprofen 800 MG tablet Commonly known as: ADVIL TAKE 1 TABLET BY MOUTH TWICE A DAY AS NEEDED What changed: reasons to take this   lactulose 10 GM/15ML solution Commonly known as: CHRONULAC Take 15 mLs (10 g total) by mouth 3 (three) times daily.   latanoprost 0.005 % ophthalmic solution Commonly known as: XALATAN PLACE 1 DROP INTO BOTH EYES TWICE A DAY What changed: See the new instructions.   montelukast 10 MG tablet Commonly known as: SINGULAIR Take 1 tablet (10 mg total) by mouth at bedtime.   omeprazole 40 MG capsule Commonly known as: PRILOSEC Take 1 capsule (40 mg total) by mouth daily.   potassium chloride 10 MEQ tablet Commonly known as: Klor-Con 10 TAKE 3 TABLETS BY MOUTH EVERY DAY What changed:   how much to take  how to take this  when to take this  additional instructions   rosuvastatin 20 MG tablet Commonly known as: CRESTOR 1 tablet daily What changed:   how much to take  how to take this  when to take this  additional instructions  traMADol 50 MG tablet Commonly known as: ULTRAM Take 1 tablet (50 mg total) by mouth every 12 (twelve) hours as needed for moderate pain.   zinc sulfate 220 (50 Zn) MG capsule Take 1 capsule (220 mg total) by mouth daily.      Allergies  Allergen Reactions  . Ace Inhibitors Cough  . Iodinated Diagnostic Agents Hives    Chills  . Lisinopril Cough  . Sulfa Antibiotics Hives  . Bactrim Rash  . Flexeril [Cyclobenzaprine Hcl] Nausea Only  . Latex Itching  . Naprosyn [Naproxen] Nausea And Vomiting    And high doses of NSAIDS   Follow-up Information    Andria Meuse, MD. Schedule an appointment as soon as possible for a visit  in 8 week(s).   Specialty: General Surgery Why: after colonoscopy to discuss elective colon resection Contact information: [address] [place] [address] [phone]        Brooke Lipps, MD. Schedule an appointment as soon as possible for a visit in 1 week(s).   Specialty: Family Medicine Why: Hospital follow up Contact information: [address] [place] [place] [address] [phone]        Charna Elizabeth, MD. Schedule an appointment as soon as possible for a visit in 8 week(s).   Specialty: Gastroenterology Why: Hospital follow up Contact information: [address] [place] [address] [phone]            The results of significant diagnostics from this hospitalization (including imaging, microbiology, ancillary and laboratory) are listed below for reference.    Significant Diagnostic Studies: Ct Abdomen Pelvis Wo Contrast  Addendum Date: 12/01/2018   ADDENDUM REPORT: 12/01/2018 03:01 ADDENDUM: Study discussed by telephone with PA SHAWN JOY on 12/01/2018 at 0247 hours. Electronically Signed   By: Odessa Fleming M.D.   On: 12/01/2018 03:01   Result Date: 12/01/2018 CLINICAL DATA:  63 year old female with lower abdominal pain. EXAM: CT ABDOMEN AND PELVIS WITHOUT CONTRAST TECHNIQUE: Multidetector CT imaging of the abdomen and pelvis was performed following the standard protocol without IV contrast. COMPARISON:  Abdomen ultrasound 11/17/2009. FINDINGS: Lower chest: Mild cardiomegaly. No pericardial effusion. Right middle lobe bronchiectasis. Bilateral lower lobe streaky peribronchial opacity which most resembles atelectasis or scarring. No pleural effusion. Hepatobiliary: Trace adjacent pneumoperitoneum. Negative noncontrast liver and gallbladder. Pancreas: Negative. Spleen: Chronic splenic cysts which were demonstrated by ultrasound in 2011, benign. Simple fluid density on CT today. Adrenals/Urinary Tract: Negative adrenal glands. Negative noncontrast  kidneys. No dilated ureters. There is inflammation along the distal course of the left ureter as below. Stomach/Bowel: Fluid-filled rectum and distal sigmoid colon. Confluent inflammation affecting the proximal sigmoid in an area of about 5 centimeters with underlying diverticulosis. Confluent mesenteric stranding (series 3, image 66 and coronal image 58). And thickening of the adjacent left lateral pelvic fossa. Furthermore, there is at least trace extraluminal gas demonstrated on series 3, image 68 and coronal image 57, plus a nearby 15 millimeter gas and intermediate density area which might also be a small contained perforation (coronal image 55). And additionally, there is trace pneumoperitoneum along the anterior superior liver seen on series 3, image 12 and coronal image 45. No free fluid. Fluid throughout the upstream descending colon which also demonstrates diverticulosis. Fluid in the transverse and right colon also with diverticula. Oral contrast was also administered and has just reached the cecum. Upper limits of normal appendix suspected on series 3, image 51. Some of the small bowel in the lower abdomen  also appears mildly inflamed (series 3, image 62). And small bowel is mildly dilated throughout the abdomen. The stomach and duodenum have a more normal appearance. Vascular/Lymphatic: Vascular patency is not evaluated in the absence of IV contrast. Minimal calcified aortic atherosclerosis. No lymphadenopathy. Reproductive: Surgically absent uterus. Diminutive or absent ovaries. Other: No pelvic free fluid. Musculoskeletal: No acute osseous abnormality identified. IMPRESSION: 1. Perforated acute diverticulitis of the proximal sigmoid colon. There is both a small area of contained perforation along the anterior inferior sigmoid mesentery (series 3, image 68), but also trace pneumoperitoneum over the liver. 2. Secondary appearing inflammation of distal small bowel, with a mild generalized ileus or bowel  obstruction. 3. Non-acute findings: Mild cardiomegaly. Right middle lobe bronchiectasis and bilateral lung base atelectasis or scarring. Chronic benign splenic cysts. Electronically Signed: By: Odessa Fleming M.D. On: 12/01/2018 02:46    Microbiology: Recent Results (from the past 240 hour(s))  SARS CORONAVIRUS 2 (TAT 6-24 HRS) Nasopharyngeal Nasopharyngeal Swab     Status: None   Collection Time: 12/01/18  3:35 AM   Specimen: Nasopharyngeal Swab  Result Value Ref Range Status   SARS Coronavirus 2 NEGATIVE NEGATIVE Final    Comment: (NOTE) SARS-CoV-2 target nucleic acids are NOT DETECTED. The SARS-CoV-2 RNA is generally detectable in upper and lower respiratory specimens during the acute phase of infection. Negative results do not preclude SARS-CoV-2 infection, do not rule out co-infections with other pathogens, and should not be used as the sole basis for treatment or other patient management decisions. Negative results must be combined with clinical observations, patient history, and epidemiological information. The expected result is Negative. Fact Sheet for Patients: HairSlick.no Fact Sheet for Healthcare Providers: quierodirigir.com This test is not yet approved or cleared by the [place] FDA and  has been authorized for detection and/or diagnosis of SARS-CoV-2 by FDA under an Emergency Use Authorization (EUA). This EUA will remain  in effect (meaning this test can be used) for the duration of the COVID-19 declaration under Section 56 4(b)(1) of the Act, 21 U.S.C. section 360bbb-3(b)(1), unless the authorization is terminated or revoked sooner. Performed at [facility], [address], [place], [place] [address]   Culture, blood (x 2)     Status: None (Preliminary result)   Collection Time: 12/01/18  6:21 AM   Specimen: BLOOD LEFT HAND  Result Value Ref Range Status   Specimen Description BLOOD LEFT HAND  Final    Special Requests   Final    BOTTLES DRAWN AEROBIC AND ANAEROBIC Blood Culture adequate volume   Culture   Final    NO GROWTH 3 DAYS Performed at [facility], [address], [place], [place] [address]    Report Status PENDING  Incomplete  Culture, blood (x 2)     Status: None (Preliminary result)   Collection Time: 12/01/18  6:21 AM   Specimen: BLOOD LEFT HAND  Result Value Ref Range Status   Specimen Description BLOOD LEFT HAND  Final   Special Requests   Final    BOTTLES DRAWN AEROBIC AND ANAEROBIC Blood Culture results may not be optimal due to an inadequate volume of blood received in culture bottles   Culture   Final    NO GROWTH 3 DAYS Performed at [facility], [address], [place], [place] [address]    Report Status PENDING  Incomplete     Labs: Basic Metabolic Panel: Recent Labs  Lab 11/30/18 1825 12/01/18 7619 12/02/18 0319 12/03/18 5093  12/04/18 0653  NA 137 138 137 138 141  K 4.8 4.3 3.9 3.0* 4.0  CL 108 107 108 105 110  CO2 18* 18* 18* 23 23  GLUCOSE 115* 121* 68* 109* 121*  BUN 28* 23 20 8 8   CREATININE 1.17* 1.02* 0.85 0.72 0.97  CALCIUM 9.1 8.7* 8.4* 8.3* 9.1  MG  --   --   --  1.7  --    Liver Function Tests: Recent Labs  Lab 11/30/18 1825  AST 16  ALT 20  ALKPHOS 88  BILITOT 1.1  PROT 6.7  ALBUMIN 3.4*   Recent Labs  Lab 11/30/18 1825  LIPASE 30   No results for input(s): AMMONIA in the last 168 hours. CBC: Recent Labs  Lab 11/30/18 1825 12/01/18 9628 12/02/18 0319 12/03/18 0336 12/04/18 0653  WBC 22.4* 20.8* 15.8* 8.9 6.6  NEUTROABS  --   --   --   --  4.4  HGB 12.3 12.1 10.4* 10.0* 10.7*  HCT 39.4 39.2 33.4* 31.7* 33.7*  MCV 88.7 90.1 89.1 86.4 86.2  PLT 220 212 190 209 239   Cardiac Enzymes: No results for input(s): CKTOTAL, CKMB, CKMBINDEX, TROPONINI in the last 168 hours. BNP: BNP (last 3 results) No results for input(s): BNP in the last 8760 hours.  ProBNP (last 3 results) No results for input(s):  PROBNP in the last 8760 hours.  CBG: Recent Labs  Lab 12/02/18 0748 12/02/18 0832 12/02/18 1628 12/03/18 0811 12/04/18 0909  GLUCAP 64* 126* 84 118* 114*       Signed:  Eriona Kinchen  [facility] 12/04/2018, 9:48 AM

## 2018-12-04 NOTE — Progress Notes (Signed)
[facility] [place] [facility]/Trauma Progress Note      Assessment/Plan HTN Asthma GERD HLD Prediabetic  Sigmoid diverticulitis with microperforation, small amount of free air -If patient worsens or does not improve she will need surgical intervention -pt states no pain, WBC WNL, afebrile - pt is okay for discharge from our standpoint if she tolerates a soft diet  FIE:PPIR diet VTE: SCD's, lovenoxokay from our standpoint JJ:OACZY 10/16>>WBC 6.6, afebrile Foley:none Follow SA:[id number] in 6-8 weeks, F/u CCS after that   LOS: 3 days    Subjective: CC: no complaints  Denies abdominal pain, nausea, vomiting, fever or chills. Having BM's and flatus. No pain with diet.   Objective: Vital signs in last 24 hours: Temp:  [97.8 F (36.6 C)-98.6 F (37 C)] 97.8 F (36.6 C) (10/19 0500) Pulse Rate:  [75-88] 75 (10/19 0500) Resp:  [16-18] 16 (10/19 0500) BP: (106-125)/(52-60) 118/60 (10/19 0500) SpO2:  [96 %-98 %] 98 % (10/19 0500) Last BM Date: 12/03/18  Intake/Output from previous day: 10/18 0701 - 10/19 0700 In: 600 [P.O.:600] Out: -  Intake/Output this shift: No intake/output data recorded.  PE: Gen: Alert, NAD, pleasant, cooperative Pulm:Rate andeffort normal Abd: Soft, ND, no HSM,no TTP, no peritonitis Skin: no rashes noted, warm and dry   Anti-infectives: Anti-infectives (From admission, onward)   Start     Dose/Rate Route Frequency Ordered Stop   12/01/18 0800  piperacillin-tazobactam (ZOSYN) IVPB 3.375 g     3.375 g 12.5 mL/hr over 240 Minutes Intravenous Every 8 hours 12/01/18 0347     12/01/18 0300  piperacillin-tazobactam (ZOSYN) IVPB 3.375 g     3.375 g 100 mL/hr over 30 Minutes Intravenous  Once 12/01/18 0248 12/01/18 0438      Lab Results:  Recent Labs    12/03/18 0336 12/04/18 0653  WBC 8.9 6.6  HGB 10.0* 10.7*  HCT 31.7* 33.7*  PLT 209 239   BMET Recent Labs    12/03/18 0336 12/04/18 0653  NA 138 141  K 3.0* 4.0   CL 105 110  CO2 23 23  GLUCOSE 109* 121*  BUN 8 8  CREATININE 0.72 0.97  CALCIUM 8.3* 9.1   PT/INR No results for input(s): LABPROT, INR in the last 72 hours. CMP     Component Value Date/Time   NA 141 12/04/2018 0653   NA 143 09/05/2018 0816   K 4.0 12/04/2018 0653   CL 110 12/04/2018 0653   CO2 23 12/04/2018 0653   GLUCOSE 121 (H) 12/04/2018 0653   BUN 8 12/04/2018 0653   BUN 15 09/05/2018 0816   CREATININE 0.97 12/04/2018 0653   CREATININE 0.63 06/02/2015 0950   CALCIUM 9.1 12/04/2018 0653   PROT 6.7 11/30/2018 1825   PROT 6.2 09/05/2018 0816   ALBUMIN 3.4 (L) 11/30/2018 1825   ALBUMIN 4.3 09/05/2018 0816   AST 16 11/30/2018 1825   ALT 20 11/30/2018 1825   ALKPHOS 88 11/30/2018 1825   BILITOT 1.1 11/30/2018 1825   BILITOT 0.2 09/05/2018 0816   GFRNONAA >60 12/04/2018 0653   GFRNONAA >89 03/17/2014 0951   GFRAA >60 12/04/2018 0653   GFRAA >89 03/17/2014 0951   Lipase     Component Value Date/Time   LIPASE 30 11/30/2018 1825    Studies/Results: No results found.   Kalman Drape, [facility] Surgery Pager [phone] Cristine Polio, & Friday 7:00am - 4:30pm Thursdays 7:00am -11:30am

## 2018-12-06 LAB — CULTURE, BLOOD (ROUTINE X 2)
Culture: NO GROWTH
Culture: NO GROWTH
Special Requests: ADEQUATE

## 2018-12-07 ENCOUNTER — Other Ambulatory Visit: Payer: Self-pay

## 2018-12-07 DIAGNOSIS — E782 Mixed hyperlipidemia: Secondary | ICD-10-CM

## 2018-12-11 ENCOUNTER — Other Ambulatory Visit: Payer: Self-pay

## 2018-12-11 ENCOUNTER — Ambulatory Visit: Payer: Federal, State, Local not specified - PPO

## 2018-12-11 DIAGNOSIS — E782 Mixed hyperlipidemia: Secondary | ICD-10-CM | POA: Diagnosis not present

## 2018-12-12 LAB — BASIC METABOLIC PANEL
BUN/Creatinine Ratio: 24 (ref 12–28)
BUN: 19 mg/dL (ref 8–27)
CO2: 16 mmol/L — ABNORMAL LOW (ref 20–29)
Calcium: 9.6 mg/dL (ref 8.7–10.3)
Chloride: 107 mmol/L — ABNORMAL HIGH (ref 96–106)
Creatinine, Ser: 0.79 mg/dL (ref 0.57–1.00)
GFR calc Af Amer: 92 mL/min/{1.73_m2} (ref >59)
GFR calc non Af Amer: 80 mL/min/{1.73_m2} (ref >59)
Glucose: 88 mg/dL (ref 65–99)
Potassium: 4.9 mmol/L (ref 3.5–5.2)
Sodium: 142 mmol/L (ref 134–144)

## 2018-12-14 ENCOUNTER — Other Ambulatory Visit: Payer: Self-pay

## 2018-12-14 ENCOUNTER — Ambulatory Visit: Payer: Federal, State, Local not specified - PPO | Admitting: Family Medicine

## 2018-12-14 ENCOUNTER — Encounter: Payer: Self-pay | Admitting: Family Medicine

## 2018-12-14 VITALS — BP 108/86 | HR 76 | Temp 98.6°F | Ht 59.0 in | Wt 151.0 lb

## 2018-12-14 DIAGNOSIS — Z23 Encounter for immunization: Secondary | ICD-10-CM

## 2018-12-14 DIAGNOSIS — Z8639 Personal history of other endocrine, nutritional and metabolic disease: Secondary | ICD-10-CM

## 2018-12-14 DIAGNOSIS — M1711 Unilateral primary osteoarthritis, right knee: Secondary | ICD-10-CM | POA: Diagnosis not present

## 2018-12-14 DIAGNOSIS — K5732 Diverticulitis of large intestine without perforation or abscess without bleeding: Secondary | ICD-10-CM

## 2018-12-14 MED ORDER — TRAMADOL HCL 50 MG PO TABS: 50.0000 mg | ORAL_TABLET | Freq: Two times a day (BID) | ORAL | 2 refills | Status: DC | PRN

## 2018-12-14 NOTE — Patient Instructions (Signed)
° ° ° °  If you have lab work done today you will be contacted with your lab results within the next 2 weeks.  If you have not heard from us then please contact us. The fastest way to get your results is to register for My Chart. ° ° °IF you received an x-ray today, you will receive an invoice from [facility]. Please contact [facility] at [phone] with questions or concerns regarding your invoice.  ° °IF you received labwork today, you will receive an invoice from [facility]. Please contact [facility] at [phone] with questions or concerns regarding your invoice.  ° °Our billing staff will not be able to assist you with questions regarding bills from these companies. ° °You will be contacted with the lab results as soon as they are available. The fastest way to get your results is to activate your My Chart account. Instructions are located on the last page of this paperwork. If you have not heard from us regarding the results in 2 weeks, please contact this office. °  ° ° ° °

## 2018-12-14 NOTE — Progress Notes (Signed)
10/29/20209:32 AM  Brooke Wilson [birth year], 63 y.o., female [id number]  Chief Complaint  Patient presents with  . Follow-up    diverticulitis with some perforation    HPI:   Patient is a 63 y.o. female with past medical history significant for HTN, HLP, prediabetes, asthma, GERD, aniety who presents today for hosp followup  hosp from oct 15-19th for sepsis with diverticulitis with perforation of proximal sigmoid colon Blood cx neg Had mild AKI and hypokalemia D/c on soft diet, augmentin  followup with GI and gen surg  Patient overall doing well Denies any abd pain or fevers Tolerating PO Constipation well managed with lactulose She has appt with Dr Loreta Ave  She has no appt with gen surg, pending colonoscopy results   Recently ortho, right Knee endstage OA Recommended replacement Needs refill of tramadol Takes for OA pain: knee, neck pmp reviewed  She has been waiting to get flu vaccine at work, but requesting today Td given at work Works as Estate manager/land agent  Component Value Date   CREATININE 0.79 12/11/2018   BUN 19 12/11/2018   NA 142 12/11/2018   K 4.9 12/11/2018   CL 107 (H) 12/11/2018   CO2 16 (L) 12/11/2018    Depression screen PHQ 2/9 10/19/2018 04/21/2018 09/21/2017  Decreased Interest 0 0 0  Down, Depressed, Hopeless 0 0 0  PHQ - 2 Score 0 0 0    Fall Risk  10/19/2018 04/21/2018 09/21/2017 04/22/2017 11/13/2016  Falls in the past year? 0 0 No No No  Number falls in past yr: 0 0 - - -  Injury with Fall? 0 0 - - -     Allergies  Allergen Reactions  . Ace Inhibitors Cough  . Iodinated Diagnostic Agents Hives    Chills  . Lisinopril Cough  . Sulfa Antibiotics Hives  . Bactrim Rash  . Flexeril [Cyclobenzaprine Hcl] Nausea Only  . Latex Itching  . Naprosyn [Naproxen] Nausea And Vomiting    And high doses of NSAIDS    Prior to Admission medications   Medication Sig Start Date End Date Taking? Authorizing Provider  albuterol (PROVENTIL HFA) 108  (90 Base) MCG/ACT inhaler INHALE 2 PUFFS INTO THE LUNGS EVERY 6 HOURS AS NEEDED FOR WHEEZING OR SHORTNESS OF BREATH Patient taking differently: Inhale 2 puffs into the lungs every 6 (six) hours as needed for wheezing or shortness of breath.  04/21/18   [name], MD  aMILoride (MIDAMOR) 5 MG tablet Take 1 tablet (5 mg total) by mouth daily. 04/21/18   [name], MD  amoxicillin-clavulanate (AUGMENTIN) 875-125 MG tablet Take 1 tablet by mouth 2 (two) times daily for 11 days. 12/04/18 12/15/18  Edsel Petrin, DO  aspirin 81 MG tablet Take 81 mg by mouth daily.    [provider]  carvedilol (COREG) 12.5 MG tablet Take 1 tablet (12.5 mg total) by mouth 2 (two) times daily. 11/17/18   Baldo Daub, MD  cloNIDine (CATAPRES) 0.1 MG tablet Take 1 tablet (0.1 mg total) by mouth at bedtime. 11/17/18   Baldo Daub, MD  diazepam (VALIUM) 10 MG tablet TAKE 1 TABLET BY MOUTH EVERY DAY AS NEEDED FOR MUSCLE SPASM Patient taking differently: Take 10 mg by mouth daily as needed (for muscle spams).  09/13/18   [name], MD  diltiazem (CARDIZEM CD) 120 MG 24 hr capsule Take 1 capsule (120 mg total) by mouth every morning. Patient taking differently: Take 120 mg by mouth daily.  11/17/18  Richardo Priest, MD  diltiazem (CARDIZEM CD) 240 MG 24 hr capsule 1 CAPSULE IN EVENING Patient taking differently: Take 240 mg by mouth daily.  11/17/18   Richardo Priest, MD  famotidine (PEPCID) 40 MG tablet Take 1 tablet (40 mg total) by mouth daily. 04/21/18   Rutherford Guys, MD  fluticasone (FLONASE) 50 MCG/ACT nasal spray PLACE 2 SPRAYS IN THE NOSTRIL EVERY DAY Patient taking differently: Place 2 sprays into both nostrils daily.  04/21/18   Rutherford Guys, MD  hydrALAZINE (APRESOLINE) 25 MG tablet Take 1 tablet (25 mg total) by mouth 3 (three) times daily. 11/17/18   Richardo Priest, MD  ibuprofen (ADVIL) 800 MG tablet TAKE 1 TABLET BY MOUTH TWICE A DAY AS NEEDED Patient taking differently: Take  800 mg by mouth 2 (two) times daily as needed for mild pain.  11/23/18   Rutherford Guys, MD  lactulose (CHRONULAC) 10 GM/15ML solution Take 15 mLs (10 g total) by mouth 3 (three) times daily. 04/21/18   Rutherford Guys, MD  latanoprost (XALATAN) 0.005 % ophthalmic solution PLACE 1 DROP INTO BOTH EYES TWICE A DAY Patient taking differently: Place 1 drop into both eyes 2 (two) times daily.  04/20/15   Leandrew Koyanagi, MD  montelukast (SINGULAIR) 10 MG tablet Take 1 tablet (10 mg total) by mouth at bedtime. 04/21/18   Rutherford Guys, MD  omeprazole (PRILOSEC) 40 MG capsule Take 1 capsule (40 mg total) by mouth daily. 04/21/18   Rutherford Guys, MD  potassium chloride (KLOR-CON 10) 10 MEQ tablet TAKE 3 TABLETS BY MOUTH EVERY DAY Patient taking differently: Take 30 mEq by mouth daily.  11/17/18   Richardo Priest, MD  rosuvastatin (CRESTOR) 20 MG tablet 1 tablet daily Patient taking differently: Take 20 mg by mouth daily.  04/21/18   Rutherford Guys, MD  traMADol (ULTRAM) 50 MG tablet Take 1 tablet (50 mg total) by mouth every 12 (twelve) hours as needed for moderate pain. 12/04/18   Mikhail, Velta Addison, DO  zinc sulfate 220 MG capsule Take 1 capsule (220 mg total) by mouth daily. 02/17/15   Leandrew Koyanagi, MD    Past Medical History:  Diagnosis Date  . Acute maxillary sinusitis 11/12/2015  . Allergy   . Anxiety state 05/17/2011  . AR (allergic rhinitis) 02/17/2012  . Asthma   . BMI 31.0-31.9,adult 03/23/2011  . Cough 03/23/2011  . Dental disease 02/17/2012   [place] in [place] 01/2012   . Essential hypertension, benign 03/23/2011  . GERD (gastroesophageal reflux disease) 05/17/2011  . H/O varicella   . History of measles, mumps, or rubella   . Hyperlipemia 03/23/2011  . Hyperlipidemia   . Hypertension   . Hypertensive disorder 09/30/2016  . Insomnia 05/17/2011  . Mid back pain 02/17/2012  . Neck pain 02/17/2012  . Obesity 09/30/2016  . 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 RAD (reactive airway  disease) 02/17/2012    Past Surgical History:  Procedure Laterality Date  . TUBAL LIGATION    . VAGINAL HYSTERECTOMY     uterine prolapse; DUB; ovaries intact.  [facility]    Social History   Tobacco Use  . Smoking status: Never Smoker  . Smokeless tobacco: Never Used  Substance Use Topics  . Alcohol use: No    Alcohol/week: 0.0 standard drinks    Family History  Problem Relation Age of Onset  . Hypertension Mother   . Stroke Mother   . Hypertension Brother   .  Stroke Brother   . Cancer Maternal Grandmother        ovarian  . Hypertension Sister   . Stroke Sister [id number]       CVA    Review of Systems  Constitutional: Negative for chills and fever.  Respiratory: Negative for cough and shortness of breath.   Cardiovascular: Negative for chest pain, palpitations and leg swelling.  Gastrointestinal: Negative for abdominal pain, nausea and vomiting.     OBJECTIVE:  Today's Vitals   12/14/18 0933  BP: 108/86  Pulse: 76  Temp: 98.6 F (37 C)  SpO2: 96%  Weight: 151 lb (68.5 kg)  Height: 4\' 11"  (1.499 m)   Body mass index is 30.5 kg/m.   Physical Exam Vitals signs and nursing note reviewed.  Constitutional:      Appearance: She is well-developed.  HENT:     Head: Normocephalic and atraumatic.     Mouth/Throat:     Pharynx: No oropharyngeal exudate.  Eyes:     General: No scleral icterus.    Conjunctiva/sclera: Conjunctivae normal.     Pupils: Pupils are equal, round, and reactive to light.  Neck:     Musculoskeletal: Neck supple.  Cardiovascular:     Rate and Rhythm: Normal rate and regular rhythm.     Heart sounds: Normal heart sounds. No murmur. No friction rub. No gallop.   Pulmonary:     Effort: Pulmonary effort is normal.     Breath sounds: Normal breath sounds. No wheezing or rales.  Abdominal:     General: Bowel sounds are normal. There is no distension.     Palpations: Abdomen is soft. There is no hepatomegaly or splenomegaly.     Tenderness:  There is no abdominal tenderness.  Skin:    General: Skin is warm and dry.  Neurological:     Mental Status: She is alert and oriented to person, place, and time.     No results found for this or any previous visit (from the past 24 hour(s)).  No results found.   ASSESSMENT and PLAN  1. Diverticulitis of colon Recovering well. Complete abx. Has upcoming appt with GI, will discuss with them whom to see for surg  2. H/O hypokalemia Resolved.   3. Need for prophylactic vaccination and inoculation against influenza - Flu Vaccine QUAD 36+ mos IM  4. Primary osteoarthritis of right knee Tramadol refilled. pmp reviewed. Considering timing for R TKA  Other orders - traMADol (ULTRAM) 50 MG tablet; Take 1 tablet (50 mg total) by mouth every 12 (twelve) hours as needed for moderate pain.  Return for as scheduled.    Myles LippsIrma M Santiago, MD [facility] at [facility] [address] [place], [place] [address] Ph.  [phone] Fax [phone]

## 2018-12-19 ENCOUNTER — Other Ambulatory Visit: Payer: Self-pay | Admitting: Family Medicine

## 2018-12-19 DIAGNOSIS — G8929 Other chronic pain: Secondary | ICD-10-CM

## 2018-12-19 NOTE — Telephone Encounter (Signed)
Requested medication (s) are due for refill today: no  Requested medication (s) are on the active medication list: no  Last refill:  04/24/2018  Future visit scheduled:yes  Notes to clinic: medication has been discontinued    Requested Prescriptions  Pending Prescriptions Disp Refills   baclofen (LIORESAL) 20 MG tablet [Pharmacy Med Name: BACLOFEN 20 MG TABLET] 90 tablet 0    Sig: TAKE 1 TABLET BY MOUTH TWICE A DAY AS NEEDED FOR MUSCLE SPASMS     Not Delegated - Analgesics:  Muscle Relaxants Failed - 12/19/2018 11:11 AM      Failed - This refill cannot be delegated      Passed - Valid encounter within last 6 months    Recent Outpatient Visits          5 days ago Diverticulitis of colon   [facility], Lilia Argue, MD   2 months ago Essential hypertension   [facility], Lilia Argue, MD   8 months ago Prediabetes   [facility] at [facility], Lilia Argue, MD   8 months ago Essential hypertension   Primary Care at [facility], Ines Bloomer, MD   1 year ago Non-seasonal allergic rhinitis, unspecified trigger   [facility] at [facility], Lilia Argue, MD      Future Appointments            In 4 months Rutherford Guys, MD Primary Care at [place], [facility]

## 2018-12-28 DIAGNOSIS — M25562 Pain in left knee: Secondary | ICD-10-CM | POA: Diagnosis not present

## 2018-12-28 DIAGNOSIS — M25561 Pain in right knee: Secondary | ICD-10-CM | POA: Diagnosis not present

## 2018-12-28 DIAGNOSIS — M170 Bilateral primary osteoarthritis of knee: Secondary | ICD-10-CM | POA: Diagnosis not present

## 2019-02-01 DIAGNOSIS — K5730 Diverticulosis of large intestine without perforation or abscess without bleeding: Secondary | ICD-10-CM | POA: Diagnosis not present

## 2019-02-01 DIAGNOSIS — K219 Gastro-esophageal reflux disease without esophagitis: Secondary | ICD-10-CM | POA: Diagnosis not present

## 2019-02-01 DIAGNOSIS — Z1211 Encounter for screening for malignant neoplasm of colon: Secondary | ICD-10-CM | POA: Diagnosis not present

## 2019-02-01 DIAGNOSIS — R933 Abnormal findings on diagnostic imaging of other parts of digestive tract: Secondary | ICD-10-CM | POA: Diagnosis not present

## 2019-02-11 ENCOUNTER — Other Ambulatory Visit: Payer: Self-pay | Admitting: Family Medicine

## 2019-02-11 DIAGNOSIS — K219 Gastro-esophageal reflux disease without esophagitis: Secondary | ICD-10-CM

## 2019-02-22 DIAGNOSIS — M1711 Unilateral primary osteoarthritis, right knee: Secondary | ICD-10-CM | POA: Diagnosis not present

## 2019-03-01 DIAGNOSIS — M1711 Unilateral primary osteoarthritis, right knee: Secondary | ICD-10-CM | POA: Diagnosis not present

## 2019-03-02 ENCOUNTER — Other Ambulatory Visit: Payer: Self-pay | Admitting: Family Medicine

## 2019-03-02 NOTE — Telephone Encounter (Signed)
Requested medication (s) are due for refill today: Yes  Requested medication (s) are on the active medication list: Yes  Last refill:  09/13/18  Future visit scheduled: Yes  Notes to clinic:  See request.    Requested Prescriptions  Pending Prescriptions Disp Refills   diazepam (VALIUM) 10 MG tablet [Pharmacy Med Name: DIAZEPAM 10 MG TABLET] 30 tablet 1    Sig: TAKE 1 TABLET BY MOUTH EVERY DAY AS NEEDED FOR MUSCLE SPASM      Not Delegated - Psychiatry:  Anxiolytics/Hypnotics Failed - 03/02/2019  3:44 PM      Failed - This refill cannot be delegated      Failed - Urine Drug Screen completed in last 360 days.      Passed - Valid encounter within last 6 months    Recent Outpatient Visits           2 months ago Diverticulitis of colon   [facility], Meda Coffee, MD   4 months ago Essential hypertension   [facility], Meda Coffee, MD   10 months ago Prediabetes   [facility], Meda Coffee, MD   10 months ago Essential hypertension   Primary Care at [facility], Eilleen Kempf, MD   1 year ago Non-seasonal allergic rhinitis, unspecified trigger   [facility] at [facility], Meda Coffee, MD       Future Appointments             In 1 month [name], MD Primary Care at [place], [facility]

## 2019-03-05 NOTE — Telephone Encounter (Signed)
Patient is requesting a refill of the following medications: Requested Prescriptions   Pending Prescriptions Disp Refills  . diazepam (VALIUM) 10 MG tablet [Pharmacy Med Name: DIAZEPAM 10 MG TABLET] 30 tablet 1    Sig: TAKE 1 TABLET BY MOUTH EVERY DAY AS NEEDED FOR MUSCLE SPASM    Date of patient request:12/31/19 Last office visit: 12/14/18 Date of last refill: 09/13/18 Last refill amount: 30 no refills Follow up time period per chart: n/a Next appt : 04/19/19 with santiago

## 2019-03-05 NOTE — Telephone Encounter (Signed)
pmp reviewd, appropriate meds refilled 

## 2019-03-08 DIAGNOSIS — M170 Bilateral primary osteoarthritis of knee: Secondary | ICD-10-CM | POA: Diagnosis not present

## 2019-03-12 ENCOUNTER — Other Ambulatory Visit: Payer: Self-pay

## 2019-03-12 ENCOUNTER — Encounter: Payer: Self-pay | Admitting: Podiatry

## 2019-03-12 ENCOUNTER — Ambulatory Visit: Payer: Federal, State, Local not specified - PPO | Admitting: Podiatry

## 2019-03-12 VITALS — Temp 97.9°F

## 2019-03-12 DIAGNOSIS — L600 Ingrowing nail: Secondary | ICD-10-CM | POA: Diagnosis not present

## 2019-03-12 DIAGNOSIS — M79675 Pain in left toe(s): Secondary | ICD-10-CM | POA: Diagnosis not present

## 2019-03-12 DIAGNOSIS — M79674 Pain in right toe(s): Secondary | ICD-10-CM

## 2019-03-12 MED ORDER — CEPHALEXIN 500 MG PO CAPS: 500.0000 mg | ORAL_CAPSULE | Freq: Three times a day (TID) | ORAL | 0 refills | Status: DC

## 2019-03-12 NOTE — Progress Notes (Signed)
Subjective:   Patient ID: Brooke Wilson, female   DOB: 64 y.o.   MRN: [id number]   HPI 64 year old female presents the office today for concerns of chronic ingrown toenails of both of her big toes on both corners.  She states that she typically will get pedicures and she tries to get the nails trimmed but they are becoming more tender and she was at the corners removed.  Denies any drainage or pus.  She has no other concerns.   Review of Systems  All other systems reviewed and are negative.  Past Medical History:  Diagnosis Date  . Acute maxillary sinusitis 11/12/2015  . Allergy   . Anxiety state 05/17/2011  . AR (allergic rhinitis) 02/17/2012  . Asthma   . BMI 31.0-31.9,adult 03/23/2011  . Cough 03/23/2011  . Dental disease 02/17/2012   [place] 01/2012   . Essential hypertension, benign 03/23/2011  . GERD (gastroesophageal reflux disease) 05/17/2011  . H/O varicella   . History of measles, mumps, or rubella   . Hyperlipemia 03/23/2011  . Hyperlipidemia   . Hypertension   . Hypertensive disorder 09/30/2016  . Insomnia 05/17/2011  . Mid back pain 02/17/2012  . Neck pain 02/17/2012  . Obesity 09/30/2016  . 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 RAD (reactive airway disease) 02/17/2012    Past Surgical History:  Procedure Laterality Date  . TUBAL LIGATION    . VAGINAL HYSTERECTOMY     uterine prolapse; DUB; ovaries intact.  [facility]     Current Outpatient Medications:  .  diazepam (VALIUM) 10 MG tablet, TAKE 1 TABLET BY MOUTH EVERY DAY AS NEEDED FOR MUSCLE SPASM, Disp: 30 tablet, Rfl: 1 .  albuterol (PROVENTIL HFA) 108 (90 Base) MCG/ACT inhaler, INHALE 2 PUFFS INTO THE LUNGS EVERY 6 HOURS AS NEEDED FOR WHEEZING OR SHORTNESS OF BREATH (Patient taking differently: Inhale 2 puffs into the lungs every 6 (six) hours as needed for wheezing or shortness of breath. ), Disp: 6.7 each, Rfl: 5 .  aMILoride (MIDAMOR) 5 MG tablet, TAKE 1 TABLET BY MOUTH EVERY DAY, Disp: 90 tablet, Rfl:  1 .  amLODipine (NORVASC) 10 MG tablet, Take 10 mg by mouth daily., Disp: , Rfl:  .  aspirin 81 MG tablet, Take 81 mg by mouth daily., Disp: , Rfl:  .  carvedilol (COREG) 12.5 MG tablet, Take 1 tablet (12.5 mg total) by mouth 2 (two) times daily., Disp: 180 tablet, Rfl: 1 .  cephALEXin (KEFLEX) 500 MG capsule, Take 1 capsule (500 mg total) by mouth 3 (three) times daily., Disp: 21 capsule, Rfl: 0 .  cloNIDine (CATAPRES) 0.1 MG tablet, Take 1 tablet (0.1 mg total) by mouth at bedtime., Disp: 90 tablet, Rfl: 1 .  diltiazem (CARDIZEM CD) 120 MG 24 hr capsule, Take 1 capsule (120 mg total) by mouth every morning. (Patient taking differently: Take 120 mg by mouth daily. ), Disp: 90 capsule, Rfl: 1 .  diltiazem (CARDIZEM CD) 240 MG 24 hr capsule, 1 CAPSULE IN EVENING (Patient taking differently: Take 240 mg by mouth daily. ), Disp: 90 capsule, Rfl: 1 .  diltiazem (TIAZAC) 240 MG 24 hr capsule, Take by mouth., Disp: , Rfl:  .  famotidine (PEPCID) 40 MG tablet, TAKE 1 TABLET BY MOUTH TWICE A DAY, Disp: 180 tablet, Rfl: 1 .  fluticasone (FLONASE) 50 MCG/ACT nasal spray, PLACE 2 SPRAYS IN THE NOSTRIL EVERY DAY (Patient taking differently: Place 2 sprays into both nostrils daily. ), Disp: 48 g, Rfl: 3 .  hydrALAZINE (APRESOLINE) 25 MG tablet, Take 1 tablet (25 mg total) by mouth 3 (three) times daily., Disp: 270 tablet, Rfl: 1 .  ibuprofen (ADVIL) 800 MG tablet, TAKE 1 TABLET BY MOUTH TWICE A DAY AS NEEDED (Patient taking differently: Take 800 mg by mouth 2 (two) times daily as needed for mild pain. ), Disp: 180 tablet, Rfl: 1 .  lactulose (CHRONULAC) 10 GM/15ML solution, Take 15 mLs (10 g total) by mouth 3 (three) times daily., Disp: 240 mL, Rfl: 1 .  latanoprost (XALATAN) 0.005 % ophthalmic solution, PLACE 1 DROP INTO BOTH EYES TWICE A DAY (Patient taking differently: Place 1 drop into both eyes 2 (two) times daily. ), Disp: 15 mL, Rfl: 2 .  montelukast (SINGULAIR) 10 MG tablet, Take 1 tablet (10 mg total) by  mouth at bedtime., Disp: 90 tablet, Rfl: 3 .  omeprazole (PRILOSEC) 40 MG capsule, Take 1 capsule (40 mg total) by mouth daily., Disp: 90 capsule, Rfl: 3 .  potassium chloride (KLOR-CON 10) 10 MEQ tablet, TAKE 3 TABLETS BY MOUTH EVERY DAY (Patient taking differently: Take 30 mEq by mouth daily. ), Disp: 270 tablet, Rfl: 1 .  rosuvastatin (CRESTOR) 20 MG tablet, 1 tablet daily (Patient taking differently: Take 20 mg by mouth daily. ), Disp: 90 tablet, Rfl: 3 .  traMADol (ULTRAM) 50 MG tablet, Take 1 tablet (50 mg total) by mouth every 12 (twelve) hours as needed for moderate pain., Disp: 60 tablet, Rfl: 2 .  TRULANCE 3 MG TABS, Take 1 tablet by mouth daily., Disp: , Rfl:  .  zinc sulfate 220 MG capsule, Take 1 capsule (220 mg total) by mouth daily., Disp: 90 capsule, Rfl: 3  Allergies  Allergen Reactions  . Ace Inhibitors Cough  . Iodinated Diagnostic Agents Hives    Chills  . Lisinopril Cough  . Sulfa Antibiotics Hives  . Tape     Surgical tape causes itching  . Bactrim Rash  . Flexeril [Cyclobenzaprine Hcl] Nausea Only  . Latex Itching  . Naprosyn [Naproxen] Nausea And Vomiting    And high doses of NSAIDS    Social History   Socioeconomic History  . Marital status: Married    Spouse name: Not on file  . Number of children: Not on file  . Years of education: Not on file  . Highest education level: Not on file  Occupational History  . Not on file  Tobacco Use  . Smoking status: Never Smoker  . Smokeless tobacco: Never Used  Substance and Sexual Activity  . Alcohol use: No    Alcohol/week: 0.0 standard drinks  . Drug use: No  . Sexual activity: Yes    Birth control/protection: Post-menopausal, Surgical  Other Topics Concern  . Not on file  Social History Narrative   Marital status:  Married x 38 years; from the [place]; moved to [place] in 1987.      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      Advanced Directives: DNR/DNI; HCPOA: Sati Osterlund-Enicole   Social Determinants of Health   Financial Resource Strain:   . Difficulty of Paying Living Expenses: Not on file  Food Insecurity:   .  Worried About Charity fundraiser in the Last Year: Not on file  . Ran Out of Food in the Last Year: Not on file  Transportation Needs:   . Lack of Transportation (Medical): Not on file  . Lack of Transportation (Non-Medical): Not on file  Physical Activity:   . Days of Exercise per Week: Not on file  . Minutes of Exercise per Session: Not on file  Stress:   . Feeling of Stress : Not on file  Social Connections:   . Frequency of Communication with Friends and Family: Not on file  . Frequency of Social Gatherings with Friends and Family: Not on file  . Attends Religious Services: Not on file  . Active Member of Clubs or Organizations: Not on file  . Attends Archivist Meetings: Not on file  . Marital Status: Not on file  Intimate Partner Violence:   . Fear of Current or Ex-Partner: Not on file  . Emotionally Abused: Not on file  . Physically Abused: Not on file  . Sexually Abused: Not on file        Objective:  Physical Exam  General: AAO x3, NAD  Dermatological: Patient present to both the medial lateral aspects of bilateral hallux toenails with tenderness to palpation.  No edema, erythema, drainage or pus or any signs of infection present but there is tenderness.  Vascular: Dorsalis Pedis artery and Posterior Tibial artery pedal pulses are 2/4 bilateral with immedate capillary fill time. There is no pain with calf compression, swelling, warmth, erythema.   Neruologic: Grossly intact via light touch bilateral.   Musculoskeletal: No gross boney pedal deformities bilateral. No pain, crepitus, or limitation noted with foot and ankle  range of motion bilateral. Muscular strength 5/5 in all groups tested bilateral.  Gait: Unassisted, Nonantalgic.       Assessment:   Ingrown toenails bilateral medial, lateral hallux nail borders     Plan:  -Treatment options discussed including all alternatives, risks, and complications -Etiology of symptoms were discussed -At this time, the patient is requesting partial nail removal with chemical matricectomy to the symptomatic portion of the nail. Risks and complications were discussed with the patient for which they understand and written consent was obtained. Under sterile conditions a total of 3 mL of a mixture of 2% lidocaine plain and 0.5% Marcaine plain was infiltrated in a hallux block fashion. Once anesthetized, the skin was prepped in sterile fashion. A tourniquet was then applied. Next the medial/lateral aspect of hallux nail border was then sharply excised making sure to remove the entire offending nail border. Once the nails were ensured to be removed area was debrided and the underlying skin was intact. There is no purulence identified in the procedure. Next phenol was then applied under standard conditions and copiously irrigated. Silvadene was applied. A dry sterile dressing was applied. After application of the dressing the tourniquet was removed and there is found to be an immediate capillary refill time to the digit. The patient tolerated the procedure well any complications. Post procedure instructions were discussed the patient for which he verbally understood. Follow-up in one week for nail check or sooner if any problems are to arise. Discussed signs/symptoms of infection and directed to call the office immediately should any occur or go directly to the emergency room. In the meantime, encouraged to call the office with any questions, concerns, changes symptoms. -Keflex  Return in about 2 weeks (around 03/26/2019) for nail check-big toes bi-lateral.  Bonna Gains  Ardelle Anton  DPM

## 2019-03-12 NOTE — Patient Instructions (Signed)

## 2019-03-23 ENCOUNTER — Other Ambulatory Visit: Payer: Self-pay | Admitting: Family Medicine

## 2019-03-23 ENCOUNTER — Other Ambulatory Visit: Payer: Self-pay | Admitting: Cardiology

## 2019-03-30 DIAGNOSIS — R1032 Left lower quadrant pain: Secondary | ICD-10-CM | POA: Diagnosis not present

## 2019-03-30 DIAGNOSIS — Z1211 Encounter for screening for malignant neoplasm of colon: Secondary | ICD-10-CM | POA: Diagnosis not present

## 2019-03-30 DIAGNOSIS — K5730 Diverticulosis of large intestine without perforation or abscess without bleeding: Secondary | ICD-10-CM | POA: Diagnosis not present

## 2019-03-30 LAB — HM COLONOSCOPY

## 2019-04-08 ENCOUNTER — Other Ambulatory Visit: Payer: Self-pay | Admitting: Family Medicine

## 2019-04-08 NOTE — Telephone Encounter (Signed)
Requested medication (s) are due for refill today: yes  Requested medication (s) are on the active medication list: yes  Last refill: 12/14/18   #60  2 refills  Future visit scheduled yes   04/19/19  Notes to clinic:not delegated  Requested Prescriptions  Pending Prescriptions Disp Refills   traMADol (ULTRAM) 50 MG tablet [Pharmacy Med Name: TRAMADOL HCL 50 MG TABLET] 60 tablet 2    Sig: TAKE 1 TABLET BY MOUTH EVERY 12 HOURS AS NEEDED FOR MODERATE PAIN      Not Delegated - Analgesics:  Opioid Agonists Failed - 04/08/2019  3:37 PM      Failed - This refill cannot be delegated      Failed - Urine Drug Screen completed in last 360 days.      Passed - Valid encounter within last 6 months    Recent Outpatient Visits           3 months ago Diverticulitis of colon   [facility], Meda Coffee, MD   5 months ago Essential hypertension   [facility], Meda Coffee, MD   11 months ago Prediabetes   [facility], Meda Coffee, MD   11 months ago Essential hypertension   Primary Care at [facility], Eilleen Kempf, MD   1 year ago Non-seasonal allergic rhinitis, unspecified trigger   [facility] at [facility], Meda Coffee, MD       Future Appointments             In 1 week [name], MD Primary Care at [place], [facility]

## 2019-04-11 NOTE — Telephone Encounter (Signed)
pmp reviewed  Med refilled 

## 2019-04-17 ENCOUNTER — Other Ambulatory Visit: Payer: Self-pay

## 2019-04-17 ENCOUNTER — Ambulatory Visit (INDEPENDENT_AMBULATORY_CARE_PROVIDER_SITE_OTHER): Payer: Federal, State, Local not specified - PPO | Admitting: Family Medicine

## 2019-04-17 DIAGNOSIS — I10 Essential (primary) hypertension: Secondary | ICD-10-CM | POA: Diagnosis not present

## 2019-04-17 DIAGNOSIS — E785 Hyperlipidemia, unspecified: Secondary | ICD-10-CM

## 2019-04-17 DIAGNOSIS — Z8639 Personal history of other endocrine, nutritional and metabolic disease: Secondary | ICD-10-CM | POA: Diagnosis not present

## 2019-04-17 DIAGNOSIS — R7303 Prediabetes: Secondary | ICD-10-CM

## 2019-04-18 LAB — COMPREHENSIVE METABOLIC PANEL
ALT: 26 IU/L (ref 0–32)
AST: 28 IU/L (ref 0–40)
Albumin/Globulin Ratio: 1.9 (ref 1.2–2.2)
Albumin: 4.5 g/dL (ref 3.8–4.8)
Alkaline Phosphatase: 98 IU/L (ref 39–117)
BUN/Creatinine Ratio: 20 (ref 12–28)
BUN: 17 mg/dL (ref 8–27)
Bilirubin Total: <0.2 mg/dL (ref 0.0–1.2)
CO2: 20 mmol/L (ref 20–29)
Calcium: 9.4 mg/dL (ref 8.7–10.3)
Chloride: 106 mmol/L (ref 96–106)
Creatinine, Ser: 0.87 mg/dL (ref 0.57–1.00)
GFR calc Af Amer: 82 mL/min/{1.73_m2} (ref >59)
GFR calc non Af Amer: 71 mL/min/{1.73_m2} (ref >59)
Globulin, Total: 2.4 g/dL (ref 1.5–4.5)
Glucose: 92 mg/dL (ref 65–99)
Potassium: 4.6 mmol/L (ref 3.5–5.2)
Sodium: 144 mmol/L (ref 134–144)
Total Protein: 6.9 g/dL (ref 6.0–8.5)

## 2019-04-18 LAB — HEMOGLOBIN A1C
Est. average glucose Bld gHb Est-mCnc: 114 mg/dL
Hgb A1c MFr Bld: 5.6 % (ref 4.8–5.6)

## 2019-04-19 ENCOUNTER — Ambulatory Visit: Payer: Federal, State, Local not specified - PPO | Admitting: Family Medicine

## 2019-04-19 ENCOUNTER — Other Ambulatory Visit: Payer: Self-pay

## 2019-04-19 ENCOUNTER — Encounter: Payer: Self-pay | Admitting: Family Medicine

## 2019-04-19 VITALS — BP 112/64 | HR 82 | Temp 98.7°F | Ht 59.0 in | Wt 149.5 lb

## 2019-04-19 DIAGNOSIS — M179 Osteoarthritis of knee, unspecified: Secondary | ICD-10-CM

## 2019-04-19 DIAGNOSIS — M542 Cervicalgia: Secondary | ICD-10-CM

## 2019-04-19 DIAGNOSIS — M1710 Unilateral primary osteoarthritis, unspecified knee: Secondary | ICD-10-CM | POA: Insufficient documentation

## 2019-04-19 DIAGNOSIS — R7303 Prediabetes: Secondary | ICD-10-CM

## 2019-04-19 DIAGNOSIS — K219 Gastro-esophageal reflux disease without esophagitis: Secondary | ICD-10-CM | POA: Diagnosis not present

## 2019-04-19 DIAGNOSIS — I10 Essential (primary) hypertension: Secondary | ICD-10-CM

## 2019-04-19 DIAGNOSIS — M1711 Unilateral primary osteoarthritis, right knee: Secondary | ICD-10-CM

## 2019-04-19 DIAGNOSIS — Z8639 Personal history of other endocrine, nutritional and metabolic disease: Secondary | ICD-10-CM

## 2019-04-19 DIAGNOSIS — E782 Mixed hyperlipidemia: Secondary | ICD-10-CM | POA: Diagnosis not present

## 2019-04-19 DIAGNOSIS — G8929 Other chronic pain: Secondary | ICD-10-CM

## 2019-04-19 HISTORY — DX: Osteoarthritis of knee, unspecified: M17.9

## 2019-04-19 MED ORDER — POTASSIUM CHLORIDE ER 10 MEQ PO TBCR: EXTENDED_RELEASE_TABLET | ORAL | 1 refills | Status: DC

## 2019-04-19 MED ORDER — AMILORIDE HCL 5 MG PO TABS: 5.0000 mg | ORAL_TABLET | Freq: Every day | ORAL | 1 refills | Status: DC

## 2019-04-19 MED ORDER — TRAMADOL HCL 50 MG PO TABS: ORAL_TABLET | ORAL | 5 refills | Status: DC

## 2019-04-19 MED ORDER — ROSUVASTATIN CALCIUM 20 MG PO TABS: ORAL_TABLET | ORAL | 3 refills | Status: DC

## 2019-04-19 MED ORDER — DILTIAZEM HCL ER COATED BEADS 120 MG PO CP24: 120.0000 mg | ORAL_CAPSULE | Freq: Every morning | ORAL | 1 refills | Status: DC

## 2019-04-19 MED ORDER — AMLODIPINE BESYLATE 10 MG PO TABS: 10.0000 mg | ORAL_TABLET | Freq: Every day | ORAL | 1 refills | Status: DC

## 2019-04-19 MED ORDER — HYDRALAZINE HCL 25 MG PO TABS: 25.0000 mg | ORAL_TABLET | Freq: Three times a day (TID) | ORAL | 1 refills | Status: DC

## 2019-04-19 MED ORDER — CARVEDILOL 12.5 MG PO TABS: 12.5000 mg | ORAL_TABLET | Freq: Two times a day (BID) | ORAL | 1 refills | Status: DC

## 2019-04-19 MED ORDER — DILTIAZEM HCL ER COATED BEADS 240 MG PO CP24: 240.0000 mg | ORAL_CAPSULE | Freq: Every day | ORAL | 1 refills | Status: DC

## 2019-04-19 MED ORDER — OMEPRAZOLE 40 MG PO CPDR: 40.0000 mg | DELAYED_RELEASE_CAPSULE | Freq: Every day | ORAL | 3 refills | Status: DC

## 2019-04-19 MED ORDER — CLONIDINE HCL 0.1 MG PO TABS: 0.1000 mg | ORAL_TABLET | Freq: Every day | ORAL | 1 refills | Status: DC

## 2019-04-19 MED ORDER — FLUTICASONE PROPIONATE 50 MCG/ACT NA SUSP: NASAL | 3 refills | Status: DC

## 2019-04-19 MED ORDER — FAMOTIDINE 40 MG PO TABS: 40.0000 mg | ORAL_TABLET | Freq: Two times a day (BID) | ORAL | 1 refills | Status: DC

## 2019-04-19 MED ORDER — MONTELUKAST SODIUM 10 MG PO TABS: 10.0000 mg | ORAL_TABLET | Freq: Every day | ORAL | 3 refills | Status: DC

## 2019-04-19 MED ORDER — IBUPROFEN 800 MG PO TABS: 800.0000 mg | ORAL_TABLET | Freq: Two times a day (BID) | ORAL | 1 refills | Status: DC | PRN

## 2019-04-19 MED ORDER — LACTULOSE 10 GM/15ML PO SOLN: 10.0000 g | Freq: Three times a day (TID) | ORAL | 1 refills | Status: DC

## 2019-04-19 MED ORDER — DIAZEPAM 10 MG PO TABS: ORAL_TABLET | ORAL | 5 refills | Status: DC

## 2019-04-19 NOTE — Patient Instructions (Addendum)
  COVID-19 Vaccine Information can be found at: [email or web address] For questions related to vaccine distribution or appointments, please email [email or web address] or call [phone].     If you have lab work done today you will be contacted with your lab results within the next 2 weeks.  If you have not heard from us then please contact us. The fastest way to get your results is to register for My Chart.   IF you received an x-ray today, you will receive an invoice from [facility]. Please contact [facility] at [phone] with questions or concerns regarding your invoice.   IF you received labwork today, you will receive an invoice from [facility]. Please contact [facility] at [phone] with questions or concerns regarding your invoice.   Our billing staff will not be able to assist you with questions regarding bills from these companies.  You will be contacted with the lab results as soon as they are available. The fastest way to get your results is to activate your My Chart account. Instructions are located on the last page of this paperwork. If you have not heard from us regarding the results in 2 weeks, please contact this office.       

## 2019-04-19 NOTE — Progress Notes (Signed)
3/4/20214:21 PM  Brooke Wilson 04/05/1955, 64 y.o., female [id number]  Chief Complaint  Patient presents with  . Hypertension    follow up, asking for refills to be given for 1 yr  . Pain    right knee, not wanting to have the knee surgery right now, has had every injection that she could. Asking for tramodol maintenance    HPI:   Patient is a 64 y.o. female with past medical history significant for HTN, HLP, prediabetes, asthma, GERD, aniety  who presents today for 6 month followup  She had colonoscopy with Dr Collene Mares about 2 weeks, no polyps Constipation well controlled on lactulose 60 cc 3 x week  Right knee end stage OA  Unable to get surgery  Takes tramadol BID with APAP and ibuprofen She uses valium about twice a week at bedtime specially when her neck is really bothersome pmp reviewed  Lab Results  Component Value Date   HGBA1C 5.6 04/17/2019   Lab Results  Component Value Date   CREATININE 0.87 04/17/2019   BUN 17 04/17/2019   NA 144 04/17/2019   K 4.6 04/17/2019   CL 106 04/17/2019   CO2 20 04/17/2019    Depression screen PHQ 2/9 04/19/2019 12/14/2018 10/19/2018  Decreased Interest 0 0 0  Down, Depressed, Hopeless 0 0 0  PHQ - 2 Score 0 0 0    Fall Risk  04/19/2019 12/14/2018 10/19/2018 04/21/2018 09/21/2017  Falls in the past year? 0 0 0 0 No  Number falls in past yr: 0 0 0 0 -  Injury with Fall? 0 0 0 0 -  Risk for fall due to : Impaired balance/gait - - - -  Risk for fall due to: Comment candidate for knee pain - - - -     Allergies  Allergen Reactions  . Ace Inhibitors Cough  . Iodinated Diagnostic Agents Hives    Chills  . Lisinopril Cough  . Sulfa Antibiotics Hives  . Tape     Surgical tape causes itching  . Bactrim Rash  . Flexeril [Cyclobenzaprine Hcl] Nausea Only  . Latex Itching  . Naprosyn [Naproxen] Nausea And Vomiting    And high doses of NSAIDS    Prior to Admission medications   Medication Sig Start Date End Date Taking?  Authorizing Provider  albuterol (PROVENTIL HFA) 108 (90 Base) MCG/ACT inhaler INHALE 2 PUFFS INTO THE LUNGS EVERY 6 HOURS AS NEEDED FOR WHEEZING OR SHORTNESS OF BREATH Patient taking differently: Inhale 2 puffs into the lungs every 6 (six) hours as needed for wheezing or shortness of breath.  04/21/18  Yes Rutherford Guys, MD  aMILoride (MIDAMOR) 5 MG tablet TAKE 1 TABLET BY MOUTH EVERY DAY 02/12/19  Yes Rutherford Guys, MD  amLODipine (NORVASC) 10 MG tablet Take 10 mg by mouth daily. 01/31/19  Yes [provider]  aspirin 81 MG tablet Take 81 mg by mouth daily.   Yes [provider]  carvedilol (COREG) 12.5 MG tablet Take 1 tablet (12.5 mg total) by mouth 2 (two) times daily. 11/17/18  Yes Richardo Priest, MD  cephALEXin (KEFLEX) 500 MG capsule Take 1 capsule (500 mg total) by mouth 3 (three) times daily. 03/12/19  Yes Trula Slade, DPM  cloNIDine (CATAPRES) 0.1 MG tablet TAKE 1 TABLET BY MOUTH AT BEDTIME 03/23/19  Yes Richardo Priest, MD  diazepam (VALIUM) 10 MG tablet TAKE 1 TABLET BY MOUTH EVERY DAY AS NEEDED FOR MUSCLE SPASM 03/05/19  Yes Grant Fontana  M, MD  diltiazem (CARDIZEM CD) 120 MG 24 hr capsule Take 1 capsule (120 mg total) by mouth every morning. Patient taking differently: Take 120 mg by mouth daily.  11/17/18  Yes Baldo Daub, MD  diltiazem (CARDIZEM CD) 240 MG 24 hr capsule 1 CAPSULE IN EVENING Patient taking differently: Take 240 mg by mouth daily.  11/17/18  Yes Baldo Daub, MD  diltiazem (TIAZAC) 240 MG 24 hr capsule Take by mouth.   Yes [provider]  famotidine (PEPCID) 40 MG tablet TAKE 1 TABLET BY MOUTH TWICE A DAY 02/12/19  Yes [name], MD  fluticasone (FLONASE) 50 MCG/ACT nasal spray PLACE 2 SPRAYS IN THE NOSTRIL EVERY DAY Patient taking differently: Place 2 sprays into both nostrils daily.  04/21/18  Yes [name], MD  hydrALAZINE (APRESOLINE) 25 MG tablet Take 1 tablet (25 mg total) by mouth 3 (three) times daily.  11/17/18  Yes Baldo Daub, MD  ibuprofen (ADVIL) 800 MG tablet TAKE 1 TABLET BY MOUTH TWICE A DAY AS NEEDED Patient taking differently: Take 800 mg by mouth 2 (two) times daily as needed for mild pain.  11/23/18  Yes [name], MD  lactulose (CHRONULAC) 10 GM/15ML solution Take 15 mLs (10 g total) by mouth 3 (three) times daily. 04/21/18  Yes [name], MD  latanoprost (XALATAN) 0.005 % ophthalmic solution PLACE 1 DROP INTO BOTH EYES TWICE A DAY Patient taking differently: Place 1 drop into both eyes 2 (two) times daily.  04/20/15  Yes Tonye Pearson, MD  montelukast (SINGULAIR) 10 MG tablet Take 1 tablet (10 mg total) by mouth at bedtime. 04/21/18  Yes [name], MD  omeprazole (PRILOSEC) 40 MG capsule Take 1 capsule (40 mg total) by mouth daily. 04/21/18  Yes [name], MD  potassium chloride (KLOR-CON 10) 10 MEQ tablet TAKE 3 TABLETS BY MOUTH EVERY DAY Patient taking differently: Take 30 mEq by mouth daily.  11/17/18  Yes Baldo Daub, MD  rosuvastatin (CRESTOR) 20 MG tablet 1 tablet daily Patient taking differently: Take 20 mg by mouth daily.  04/21/18  Yes [name], MD  traMADol (ULTRAM) 50 MG tablet TAKE 1 TABLET BY MOUTH EVERY 12 HOURS AS NEEDED FOR MODERATE PAIN 04/11/19  Yes [name], MD  TRULANCE 3 MG TABS Take 1 tablet by mouth daily. 02/21/19  Yes [provider]  zinc sulfate 220 MG capsule Take 1 capsule (220 mg total) by mouth daily. 02/17/15  Yes Tonye Pearson, MD    Past Medical History:  Diagnosis Date  . Acute maxillary sinusitis 11/12/2015  . Allergy   . Anxiety state 05/17/2011  . AR (allergic rhinitis) 02/17/2012  . Asthma   . BMI 31.0-31.9,adult 03/23/2011  . Cough 03/23/2011  . Dental disease 02/17/2012   [place] 01/2012   . Essential hypertension, benign 03/23/2011  . GERD (gastroesophageal reflux disease) 05/17/2011  . H/O varicella   . History of measles, mumps, or rubella   . Hyperlipemia 03/23/2011   . Hyperlipidemia   . Hypertension   . Hypertensive disorder 09/30/2016  . Insomnia 05/17/2011  . Mid back pain 02/17/2012  . Neck pain 02/17/2012  . Obesity 09/30/2016  . 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 RAD (reactive airway disease) 02/17/2012    Past Surgical History:  Procedure Laterality Date  . TUBAL LIGATION    . VAGINAL HYSTERECTOMY     uterine prolapse; DUB; ovaries intact.  [facility]  Social History   Tobacco Use  . Smoking status: Never Smoker  . Smokeless tobacco: Never Used  Substance Use Topics  . Alcohol use: No    Alcohol/week: 0.0 standard drinks    Family History  Problem Relation Age of Onset  . Hypertension Mother   . Stroke Mother   . Hypertension Brother   . Stroke Brother   . Cancer Maternal Grandmother        ovarian  . Hypertension Sister   . Stroke Sister 6       CVA    Review of Systems  Constitutional: Negative for chills and fever.  Respiratory: Negative for cough and shortness of breath.   Cardiovascular: Negative for chest pain, palpitations and leg swelling.  Gastrointestinal: Negative for abdominal pain, nausea and vomiting.   Per hpi  OBJECTIVE:  Today's Vitals   04/19/19 1606  BP: 112/64  Pulse: 82  Temp: 98.7 F (37.1 C)  SpO2: 96%  Weight: 149 lb 8 oz (67.8 kg)  Height: 4\' 11"  (1.499 m)   Body mass index is 30.2 kg/m.   Physical Exam Vitals and nursing note reviewed.  Constitutional:      Appearance: She is well-developed.  HENT:     Head: Normocephalic and atraumatic.     Mouth/Throat:     Pharynx: No oropharyngeal exudate.  Eyes:     General: No scleral icterus.    Conjunctiva/sclera: Conjunctivae normal.     Pupils: Pupils are equal, round, and reactive to light.  Cardiovascular:     Rate and Rhythm: Normal rate and regular rhythm.     Heart sounds: Normal heart sounds. No murmur. No friction rub. No gallop.   Pulmonary:     Effort: Pulmonary effort is normal.     Breath sounds: Normal breath sounds.  No wheezing or rales.  Musculoskeletal:     Cervical back: Neck supple.  Skin:    General: Skin is warm and dry.  Neurological:     Mental Status: She is alert and oriented to person, place, and time.     No results found for this or any previous visit (from the past 24 hour(s)).  No results found.   ASSESSMENT and PLAN  1. Essential hypertension Controlled. Continue current regime.  - carvedilol (COREG) 12.5 MG tablet; Take 1 tablet (12.5 mg total) by mouth 2 (two) times daily.  2. Gastroesophageal reflux disease without esophagitis Controlled. Continue current regime.  - famotidine (PEPCID) 40 MG tablet; Take 1 tablet (40 mg total) by mouth 2 (two) times daily.  3. Mixed hyperlipidemia Controlled. Continue current regime.  - Lipid panel; Future  4. Prediabetes Controlled. Continue current regime.  - Hemoglobin A1c; Future  5. H/O hypokalemia Controlled. Continue current regime.  - Comprehensive metabolic panel; Future  6. Chronic neck pain 2/2 DDD. Uses diazepam prn for spasms. Had long conversation of risk of long term bzd use and strongly advised cont infrequent use. Also reviewed risks of co-use of tramadol. Discussed other treatment options. Patient wishes to continue with current regime  7. Primary osteoarthritis of right knee End stage, function and pain managed with tramadol BID. pmp reviewed  Other orders - amLODipine (NORVASC) 10 MG tablet; Take 1 tablet (10 mg total) by mouth daily. - aMILoride (MIDAMOR) 5 MG tablet; Take 1 tablet (5 mg total) by mouth daily. - cloNIDine (CATAPRES) 0.1 MG tablet; Take 1 tablet (0.1 mg total) by mouth at bedtime. - diazepam (VALIUM) 10 MG tablet; TAKE 1 TABLET BY  MOUTH EVERY DAY AS NEEDED FOR MUSCLE SPASM - diltiazem (CARDIZEM CD) 120 MG 24 hr capsule; Take 1 capsule (120 mg total) by mouth every morning. - diltiazem (CARDIZEM CD) 240 MG 24 hr capsule; Take 1 capsule (240 mg total) by mouth daily. - fluticasone (FLONASE)  50 MCG/ACT nasal spray; PLACE 2 SPRAYS IN THE NOSTRIL EVERY DAY - hydrALAZINE (APRESOLINE) 25 MG tablet; Take 1 tablet (25 mg total) by mouth 3 (three) times daily. - ibuprofen (ADVIL) 800 MG tablet; Take 1 tablet (800 mg total) by mouth 2 (two) times daily as needed. - lactulose (CHRONULAC) 10 GM/15ML solution; Take 15 mLs (10 g total) by mouth 3 (three) times daily. - montelukast (SINGULAIR) 10 MG tablet; Take 1 tablet (10 mg total) by mouth at bedtime. - omeprazole (PRILOSEC) 40 MG capsule; Take 1 capsule (40 mg total) by mouth daily. - potassium chloride (KLOR-CON 10) 10 MEQ tablet; TAKE 3 TABLETS BY MOUTH EVERY DAY - rosuvastatin (CRESTOR) 20 MG tablet; 1 tablet daily - traMADol (ULTRAM) 50 MG tablet; TAKE 1 TABLET BY MOUTH EVERY 12 HOURS AS NEEDED FOR MODERATE PAIN  Return in about 6 months (around 10/20/2019).    [name], MD Primary Care at [facility] [address] [place], [place] [address] Ph.  [phone] Fax [phone]

## 2019-08-22 DIAGNOSIS — M170 Bilateral primary osteoarthritis of knee: Secondary | ICD-10-CM | POA: Diagnosis not present

## 2019-08-23 ENCOUNTER — Telehealth: Payer: Self-pay | Admitting: Cardiology

## 2019-08-23 ENCOUNTER — Telehealth: Payer: Self-pay | Admitting: Family Medicine

## 2019-08-23 NOTE — Telephone Encounter (Signed)
Patient states that she will need to have lab orders put in prior to her appt with Dr. Dulce Sellar on 09/06/19. She states she is having an upcoming surgery next month and will need this done. She believes she needs a kidney function test and liver function test. She also states she is borderline diabetic and may need a hemoglobin test. Please advise.

## 2019-08-23 NOTE — Telephone Encounter (Signed)
Pt is sch for a 50m f/u on 10/18/19. Pt called and also stated she needs surgery clearance appt with that appt. Surgery is sch for 10/30/19. Pt states she needs blood work done for that appt and is wanting to get that done 3 days before appt on 10/18/19. Needing the orders in for this. Please advise.

## 2019-08-24 ENCOUNTER — Telehealth: Payer: Self-pay | Admitting: *Deleted

## 2019-08-24 ENCOUNTER — Telehealth: Payer: Self-pay

## 2019-08-24 ENCOUNTER — Other Ambulatory Visit: Payer: Self-pay

## 2019-08-24 DIAGNOSIS — Z01810 Encounter for preprocedural cardiovascular examination: Secondary | ICD-10-CM

## 2019-08-24 NOTE — Telephone Encounter (Signed)
Pt is scheduled to have surgery 10/30/2019. Surgery clearance form with sent to Cards office Dr. Dulce Sellar at [facility] office and [facility] office [phone] and [phone]. Copy of clearance form with be kept at nurses station.

## 2019-08-24 NOTE — Telephone Encounter (Signed)
Surgery is in September, more than 2 month away. Patient has follow up with Dr. Dulce Sellar on 7/22 for preop clearance. Will defer clearance to MD and remove this request from preop pool.  Will forward to Dr. Dulce Sellar as Lorain Childes.

## 2019-08-24 NOTE — Telephone Encounter (Signed)
   [facility] Pre-operative Risk Assessment    Request for surgical clearance:  1. What type of surgery is being performed? Right Total Knee orthroplasty  2. When is this surgery scheduled? 10/30/19  3. What type of clearance is required (medical clearance vs. Pharmacy clearance to hold med vs. Both)? Both  4. Are there any medications that need to be held prior to surgery and how long? Aspirin  5. Practice name and name of physician performing surgery? [facility] by Dr. Pilar Plate Aluisio  6. What is your office phone number [phone]   7.   What is your office fax number [phone], Attn: Claiborne Billings Hankcock  8.   Anesthesia type (None, local, MAC, general) ? Choice    Brooke Wilson 08/24/2019, 9:35 AM  _________________________________________________________________   (provider comments below)

## 2019-08-24 NOTE — Telephone Encounter (Signed)
Called pt she don,t  Haves a mail box will call back.please Advice

## 2019-08-24 NOTE — Telephone Encounter (Signed)
Orders has been placed.

## 2019-08-31 DIAGNOSIS — Z1231 Encounter for screening mammogram for malignant neoplasm of breast: Secondary | ICD-10-CM | POA: Diagnosis not present

## 2019-08-31 LAB — HM MAMMOGRAPHY

## 2019-09-04 ENCOUNTER — Other Ambulatory Visit: Payer: Self-pay

## 2019-09-04 ENCOUNTER — Ambulatory Visit (INDEPENDENT_AMBULATORY_CARE_PROVIDER_SITE_OTHER): Payer: Federal, State, Local not specified - PPO | Admitting: Family Medicine

## 2019-09-04 DIAGNOSIS — R7303 Prediabetes: Secondary | ICD-10-CM

## 2019-09-04 DIAGNOSIS — Z01810 Encounter for preprocedural cardiovascular examination: Secondary | ICD-10-CM

## 2019-09-05 LAB — CBC WITH DIFFERENTIAL/PLATELET
Basophils Absolute: 0.1 10*3/uL (ref 0.0–0.2)
Basos: 1 %
EOS (ABSOLUTE): 0.2 10*3/uL (ref 0.0–0.4)
Eos: 3 %
Hematocrit: 38.5 % (ref 34.0–46.6)
Hemoglobin: 12.2 g/dL (ref 11.1–15.9)
Immature Grans (Abs): 0 10*3/uL (ref 0.0–0.1)
Immature Granulocytes: 0 %
Lymphocytes Absolute: 2 10*3/uL (ref 0.7–3.1)
Lymphs: 31 %
MCH: 26.7 pg (ref 26.6–33.0)
MCHC: 31.7 g/dL (ref 31.5–35.7)
MCV: 84 fL (ref 79–97)
Monocytes Absolute: 0.5 10*3/uL (ref 0.1–0.9)
Monocytes: 7 %
Neutrophils Absolute: 3.7 10*3/uL (ref 1.4–7.0)
Neutrophils: 58 %
Platelets: 221 10*3/uL (ref 150–450)
RBC: 4.57 x10E6/uL (ref 3.77–5.28)
RDW: 13.7 % (ref 11.7–15.4)
WBC: 6.5 10*3/uL (ref 3.4–10.8)

## 2019-09-05 LAB — MICROALBUMIN / CREATININE URINE RATIO
Creatinine, Urine: 49.8 mg/dL
Microalb/Creat Ratio: <6 mg/g creat (ref 0–29)
Microalbumin, Urine: <3 ug/mL

## 2019-09-05 LAB — URINALYSIS, MICROSCOPIC ONLY
Bacteria, UA: NONE SEEN
Casts: NONE SEEN /lpf
WBC, UA: NONE SEEN /hpf (ref 0–5)

## 2019-09-05 LAB — BASIC METABOLIC PANEL
BUN/Creatinine Ratio: 28 (ref 12–28)
BUN: 24 mg/dL (ref 8–27)
CO2: 21 mmol/L (ref 20–29)
Calcium: 9.5 mg/dL (ref 8.7–10.3)
Chloride: 109 mmol/L — ABNORMAL HIGH (ref 96–106)
Creatinine, Ser: 0.86 mg/dL (ref 0.57–1.00)
GFR calc Af Amer: 83 mL/min/{1.73_m2} (ref >59)
GFR calc non Af Amer: 72 mL/min/{1.73_m2} (ref >59)
Glucose: 80 mg/dL (ref 65–99)
Potassium: 5 mmol/L (ref 3.5–5.2)
Sodium: 143 mmol/L (ref 134–144)

## 2019-09-05 LAB — HEMOGLOBIN A1C
Est. average glucose Bld gHb Est-mCnc: 120 mg/dL
Hgb A1c MFr Bld: 5.8 % — ABNORMAL HIGH (ref 4.8–5.6)

## 2019-09-05 NOTE — Progress Notes (Deleted)
Cardiology Office Note:    Date:  09/05/2019   ID:  Brooke Wilson, DOB Apr 15, 1955, MRN [id number]  PCP:  [name], MD  Cardiologist:  Norman Herrlich, MD    Referring MD: [name], MD    ASSESSMENT:    No diagnosis found. PLAN:    In order of problems listed above:  1. ***   Next appointment: ***   Medication Adjustments/Labs and Tests Ordered: Current medicines are reviewed at length with the patient today.  Concerns regarding medicines are outlined above.  No orders of the defined types were placed in this encounter.  No orders of the defined types were placed in this encounter.   No chief complaint on file.   History of Present Illness:    Brooke Wilson is a 64 y.o. female with a hx of resistant hypertension hyperlipidemia and prediabetes last seen 11/17/2018. Compliance with diet, lifestyle and medications: *** Past Medical History:  Diagnosis Date  . Acute maxillary sinusitis 11/12/2015  . Allergy   . Anxiety state 05/17/2011  . AR (allergic rhinitis) 02/17/2012  . Asthma   . BMI 31.0-31.9,adult 03/23/2011  . Cough 03/23/2011  . Dental disease 02/17/2012   [place] in [place] 01/2012   . Essential hypertension, benign 03/23/2011  . GERD (gastroesophageal reflux disease) 05/17/2011  . H/O varicella   . History of measles, mumps, or rubella   . Hyperlipemia 03/23/2011  . Hyperlipidemia   . Hypertension   . Hypertensive disorder 09/30/2016  . Insomnia 05/17/2011  . Mid back pain 02/17/2012  . Neck pain 02/17/2012  . Obesity 09/30/2016  . 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 RAD (reactive airway disease) 02/17/2012    Past Surgical History:  Procedure Laterality Date  . TUBAL LIGATION    . VAGINAL HYSTERECTOMY     uterine prolapse; DUB; ovaries intact.  [facility]    Current Medications: No outpatient medications have been marked as taking for the 09/06/19 encounter (Appointment) with Baldo Daub, MD.     Allergies:   Ace  inhibitors, Iodinated diagnostic agents, Lisinopril, Sulfa antibiotics, Tape, Bactrim, Flexeril [cyclobenzaprine hcl], Latex, and Naprosyn [naproxen]   Social History   Socioeconomic History  . Marital status: Married    Spouse name: Not on file  . Number of children: Not on file  . Years of education: Not on file  . Highest education level: Not on file  Occupational History  . Not on file  Tobacco Use  . Smoking status: Never Smoker  . Smokeless tobacco: Never Used  Vaping Use  . Vaping Use: Never used  Substance and Sexual Activity  . Alcohol use: No    Alcohol/week: 0.0 standard drinks  . Drug use: No  . Sexual activity: Yes    Birth control/protection: Post-menopausal, Surgical  Other Topics Concern  . Not on file  Social History Narrative   Marital status:  Married x 38 years; from the [place]; moved to [place] in 1987.      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      Advanced Directives: DNR/DNI; HCPOA: Sati Orea-Enicole   Social Determinants of Health   Financial Resource Strain:   .  Difficulty of Paying Living Expenses:   Food Insecurity:   . Worried About Programme researcher, broadcasting/film/video in the Last Year:   . Barista in the Last Year:   Transportation Needs:   . Freight forwarder (Medical):   Marland Kitchen Lack of Transportation (Non-Medical):   Physical Activity:   . Days of Exercise per Week:   . Minutes of Exercise per Session:   Stress:   . Feeling of Stress :   Social Connections:   . Frequency of Communication with Friends and Family:   . Frequency of Social Gatherings with Friends and Family:   . Attends Religious Services:   . Active Member of Clubs or Organizations:   . Attends Banker Meetings:   Marland Kitchen Marital Status:      Family  History: The patient's ***family history includes Cancer in her maternal grandmother; Hypertension in her brother, mother, and sister; Stroke in her brother and mother; Stroke (age of onset: 34) in her sister. ROS:   Please see the history of present illness.    All other systems reviewed and are negative.  EKGs/Labs/Other Studies Reviewed:    The following studies were reviewed today:  EKG:  EKG ordered today and personally reviewed.  The ekg ordered today demonstrates ***  Recent Labs: 12/03/2018: Magnesium 1.7 04/17/2019: ALT 26 09/04/2019: BUN 24; Creatinine, Ser 0.86; Hemoglobin 12.2; Platelets 221; Potassium 5.0; Sodium 143  Recent Lipid Panel    Component Value Date/Time   CHOL 143 09/05/2018 0816   TRIG 72 09/05/2018 0816   HDL 66 09/05/2018 0816   CHOLHDL 2.2 09/05/2018 0816   CHOLHDL 2.9 02/17/2015 1956   VLDL 35 (H) 02/17/2015 1956   LDLCALC 63 09/05/2018 0816    Physical Exam:    VS:  There were no vitals taken for this visit.    Wt Readings from Last 3 Encounters:  04/19/19 149 lb 8 oz (67.8 kg)  12/14/18 151 lb (68.5 kg)  12/01/18 152 lb (68.9 kg)     GEN: *** Well nourished, well developed in no acute distress HEENT: Normal NECK: No JVD; No carotid bruits LYMPHATICS: No lymphadenopathy CARDIAC: ***RRR, no murmurs, rubs, gallops RESPIRATORY:  Clear to auscultation without rales, wheezing or rhonchi  ABDOMEN: Soft, non-tender, non-distended MUSCULOSKELETAL:  No edema; No deformity  SKIN: Warm and dry NEUROLOGIC:  Alert and oriented x 3 PSYCHIATRIC:  Normal affect    Signed, Norman Herrlich, MD  09/05/2019 2:20 PM    [facility]

## 2019-09-06 ENCOUNTER — Encounter: Payer: Self-pay | Admitting: Cardiology

## 2019-09-06 ENCOUNTER — Ambulatory Visit: Payer: Federal, State, Local not specified - PPO | Admitting: Cardiology

## 2019-09-06 ENCOUNTER — Other Ambulatory Visit: Payer: Self-pay

## 2019-09-06 VITALS — BP 140/78 | HR 64 | Ht 59.0 in | Wt 150.0 lb

## 2019-09-06 DIAGNOSIS — Z01810 Encounter for preprocedural cardiovascular examination: Secondary | ICD-10-CM | POA: Diagnosis not present

## 2019-09-06 DIAGNOSIS — I10 Essential (primary) hypertension: Secondary | ICD-10-CM

## 2019-09-06 DIAGNOSIS — E782 Mixed hyperlipidemia: Secondary | ICD-10-CM | POA: Diagnosis not present

## 2019-09-06 NOTE — Patient Instructions (Signed)

## 2019-09-06 NOTE — Progress Notes (Signed)
Cardiology Office Note:    Date:  09/06/2019   ID:  Brooke Wilson, DOB 09-16-1955, MRN [id number]  PCP:  [name], MD  Cardiologist:  Norman Herrlich, MD    Referring MD: [name], MD    ASSESSMENT:    1. Preop cardiovascular exam   2. Resistant hypertension   3. Mixed hyperlipidemia    PLAN:    In order of problems listed above:  1. Her procedure is elective intermediate risk and her cardiac issues include hypertension dyslipidemia.  Blood pressure previously is very difficult to control now is at target tolerates medication she continued through the perioperative period including taking them in the morning of surgery with a sip of water.  Please do an EKG postoperative day 1 if she has problems contact heart care.  My opinion she is optimized and needs no further preoperative cardiology evaluation 2. BP at target continue her current multidrug regimen including MRA calcium channel blocker beta-blocker centrally active clonidine and hydralazine.  She is with a few people I tell her to take her medicines the morning of surgery to avoid hypertensive urgency intraoperatively with induction of anesthesia 3. Lipids are ideal continue her statin   Next appointment: 6 months   Medication Adjustments/Labs and Tests Ordered: Current medicines are reviewed at length with the patient today.  Concerns regarding medicines are outlined above.  No orders of the defined types were placed in this encounter.  No orders of the defined types were placed in this encounter.   Chief Complaint  Patient presents with  . Pre-op Exam  . Hypertension    History of Present Illness:    Brooke Wilson is a 64 y.o. female with a hx of difficult to control resistant hypertension and hyperlipidemia last seen 11/17/2018.  She is seen today as part of preoperative cardiovascular evaluation prior to right total knee arthroplasty scheduled 10/30/2019 with Dr. Regino Bellow L. Compliance  with diet, lifestyle and medications: Yes  She is completely recovered from her colon surgery feels well except for knee instability and pain pending total knee arthroplasty.  She has no angina edema shortness of breath palpitation or syncope exercise tolerance exceeds 5-7 METS.  Her blood pressure is well controlled and she tolerates her intense multidrug regimen.  She had a hospitalization October 2020 with sigmoid colon perforation and systemic inflammatory response syndrome. Past Medical History:  Diagnosis Date  . Acute maxillary sinusitis 11/12/2015  . Allergy   . Anxiety state 05/17/2011  . AR (allergic rhinitis) 02/17/2012  . Asthma   . BMI 31.0-31.9,adult 03/23/2011  . Cough 03/23/2011  . Dental disease 02/17/2012   [place] 01/2012   . Essential hypertension, benign 03/23/2011  . GERD (gastroesophageal reflux disease) 05/17/2011  . H/O varicella   . History of measles, mumps, or rubella   . Hyperlipemia 03/23/2011  . Hyperlipidemia   . Hypertension   . Hypertensive disorder 09/30/2016  . Insomnia 05/17/2011  . Mid back pain 02/17/2012  . Neck pain 02/17/2012  . Obesity 09/30/2016  . 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 RAD (reactive airway disease) 02/17/2012    Past Surgical History:  Procedure Laterality Date  . TUBAL LIGATION    . VAGINAL HYSTERECTOMY     uterine prolapse; DUB; ovaries intact.  [facility]    Current Medications: Current Meds  Medication Sig  . albuterol (PROVENTIL HFA) 108 (90 Base) MCG/ACT inhaler INHALE 2 PUFFS INTO THE LUNGS EVERY 6 HOURS AS NEEDED FOR WHEEZING OR  SHORTNESS OF BREATH (Patient taking differently: Inhale 2 puffs into the lungs every 6 (six) hours as needed for wheezing or shortness of breath. )  . aMILoride (MIDAMOR) 5 MG tablet Take 1 tablet (5 mg total) by mouth daily.  Marland Kitchen amLODipine (NORVASC) 10 MG tablet Take 1 tablet (10 mg total) by mouth daily.  Marland Kitchen aspirin 81 MG tablet Take 81 mg by mouth daily.  . carvedilol (COREG) 12.5 MG  tablet Take 1 tablet (12.5 mg total) by mouth 2 (two) times daily.  . cloNIDine (CATAPRES) 0.1 MG tablet Take 1 tablet (0.1 mg total) by mouth at bedtime.  . diazepam (VALIUM) 10 MG tablet TAKE 1 TABLET BY MOUTH EVERY DAY AS NEEDED FOR MUSCLE SPASM  . diltiazem (CARDIZEM CD) 120 MG 24 hr capsule Take 1 capsule (120 mg total) by mouth every morning.  . diltiazem (CARDIZEM CD) 240 MG 24 hr capsule Take 1 capsule (240 mg total) by mouth daily.  . famotidine (PEPCID) 40 MG tablet Take 1 tablet (40 mg total) by mouth 2 (two) times daily.  . fluticasone (FLONASE) 50 MCG/ACT nasal spray PLACE 2 SPRAYS IN THE NOSTRIL EVERY DAY  . hydrALAZINE (APRESOLINE) 25 MG tablet Take 1 tablet (25 mg total) by mouth 3 (three) times daily.  Marland Kitchen ibuprofen (ADVIL) 800 MG tablet Take 1 tablet (800 mg total) by mouth 2 (two) times daily as needed.  . lactulose (CHRONULAC) 10 GM/15ML solution Take 15 mLs (10 g total) by mouth 3 (three) times daily.  Marland Kitchen latanoprost (XALATAN) 0.005 % ophthalmic solution PLACE 1 DROP INTO BOTH EYES TWICE A DAY (Patient taking differently: Place 1 drop into both eyes 2 (two) times daily. )  . montelukast (SINGULAIR) 10 MG tablet Take 1 tablet (10 mg total) by mouth at bedtime.  Marland Kitchen omeprazole (PRILOSEC) 40 MG capsule Take 1 capsule (40 mg total) by mouth daily.  . potassium chloride (KLOR-CON 10) 10 MEQ tablet TAKE 3 TABLETS BY MOUTH EVERY DAY  . rosuvastatin (CRESTOR) 20 MG tablet 1 tablet daily  . traMADol (ULTRAM) 50 MG tablet TAKE 1 TABLET BY MOUTH EVERY 12 HOURS AS NEEDED FOR MODERATE PAIN  . zinc sulfate 220 MG capsule Take 1 capsule (220 mg total) by mouth daily.     Allergies:   Ace inhibitors, Iodinated diagnostic agents, Lisinopril, Sulfa antibiotics, Tape, Bactrim, Flexeril [cyclobenzaprine hcl], Latex, and Naprosyn [naproxen]   Social History   Socioeconomic History  . Marital status: Married    Spouse name: Not on file  . Number of children: Not on file  . Years of education:  Not on file  . Highest education level: Not on file  Occupational History  . Not on file  Tobacco Use  . Smoking status: Never Smoker  . Smokeless tobacco: Never Used  Vaping Use  . Vaping Use: Never used  Substance and Sexual Activity  . Alcohol use: No    Alcohol/week: 0.0 standard drinks  . Drug use: No  . Sexual activity: Yes    Birth control/protection: Post-menopausal, Surgical  Other Topics Concern  . Not on file  Social History Narrative   Marital status:  Married x 38 years; from the [place]; moved to [place] in 1987.      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      Advanced Directives: DNR/DNI; HCPOA: Sati Deakins-Enicole   Social Determinants of Health   Financial Resource Strain:   . Difficulty of Paying Living Expenses:   Food Insecurity:   . Worried About Programme researcher, broadcasting/film/video in the Last Year:   . Barista in the Last Year:   Transportation Needs:   . Freight forwarder (Medical):   Marland Kitchen Lack of Transportation (Non-Medical):   Physical Activity:   . Days of Exercise per Week:   . Minutes of Exercise per Session:   Stress:   . Feeling of Stress :   Social Connections:   . Frequency of Communication with Friends and Family:   . Frequency of Social Gatherings with Friends and Family:   . Attends Religious Services:   . Active Member of Clubs or Organizations:   . Attends Banker Meetings:   Marland Kitchen Marital Status:      Family History: The patient's family history includes Cancer in her maternal grandmother; Hypertension in her brother, mother, and sister; Stroke in her brother and mother; Stroke (age of onset: 18) in her sister. ROS:   Please see the history of present illness.    All other systems reviewed and are  negative.  EKGs/Labs/Other Studies Reviewed:    The following studies were reviewed today:  EKG:  EKG ordered today and personally reviewed.  The ekg ordered today demonstrates sinus rhythm first-degree AV block T wave inversion change from previous EKGs  Recent Labs: 12/03/2018: Magnesium 1.7 04/17/2019: ALT 26 09/04/2019: BUN 24; Creatinine, Ser 0.86; Hemoglobin 12.2; Platelets 221; Potassium 5.0; Sodium 143  Recent Lipid Panel    Component Value Date/Time   CHOL 143 09/05/2018 0816   TRIG 72 09/05/2018 0816   HDL 66 09/05/2018 0816   CHOLHDL 2.2 09/05/2018 0816   CHOLHDL 2.9 02/17/2015 1956   VLDL 35 (H) 02/17/2015 1956   LDLCALC 63 09/05/2018 0816    Physical Exam:    VS:  BP (!) 140/78   Pulse 64   Ht 4\' 11"  (1.499 m)   Wt 150 lb (68 kg)   SpO2 97%   BMI 30.30 kg/m     Wt Readings from Last 3 Encounters:  09/06/19 150 lb (68 kg)  04/19/19 149 lb 8 oz (67.8 kg)  12/14/18 151 lb (68.5 kg)     GEN:  Well nourished, well developed in no acute distress HEENT: Normal NECK: No JVD; No carotid bruits LYMPHATICS: No lymphadenopathy CARDIAC: RRR, no murmurs, rubs, gallops RESPIRATORY:  Clear to auscultation without rales, wheezing or rhonchi  ABDOMEN: Soft, non-tender, non-distended MUSCULOSKELETAL:  No edema; No deformity  SKIN: Warm and dry NEUROLOGIC:  Alert and oriented x 3 PSYCHIATRIC:  Normal affect    Signed, 12/16/18, MD  09/06/2019 1:44 PM    [facility]

## 2019-10-03 DIAGNOSIS — Z0189 Encounter for other specified special examinations: Secondary | ICD-10-CM | POA: Diagnosis not present

## 2019-10-09 ENCOUNTER — Other Ambulatory Visit: Payer: Self-pay | Admitting: Family Medicine

## 2019-10-16 ENCOUNTER — Ambulatory Visit: Payer: Federal, State, Local not specified - PPO

## 2019-10-18 ENCOUNTER — Ambulatory Visit: Payer: Federal, State, Local not specified - PPO | Admitting: Family Medicine

## 2019-10-18 ENCOUNTER — Other Ambulatory Visit: Payer: Self-pay

## 2019-10-18 ENCOUNTER — Encounter: Payer: Self-pay | Admitting: Family Medicine

## 2019-10-18 VITALS — BP 123/73 | HR 73 | Temp 97.8°F | Ht 59.0 in | Wt 152.8 lb

## 2019-10-18 DIAGNOSIS — J3089 Other allergic rhinitis: Secondary | ICD-10-CM

## 2019-10-18 DIAGNOSIS — M542 Cervicalgia: Secondary | ICD-10-CM

## 2019-10-18 DIAGNOSIS — R7303 Prediabetes: Secondary | ICD-10-CM | POA: Diagnosis not present

## 2019-10-18 DIAGNOSIS — Z23 Encounter for immunization: Secondary | ICD-10-CM

## 2019-10-18 DIAGNOSIS — E782 Mixed hyperlipidemia: Secondary | ICD-10-CM

## 2019-10-18 DIAGNOSIS — Z8639 Personal history of other endocrine, nutritional and metabolic disease: Secondary | ICD-10-CM

## 2019-10-18 DIAGNOSIS — I10 Essential (primary) hypertension: Secondary | ICD-10-CM | POA: Diagnosis not present

## 2019-10-18 DIAGNOSIS — K219 Gastro-esophageal reflux disease without esophagitis: Secondary | ICD-10-CM

## 2019-10-18 DIAGNOSIS — Z87448 Personal history of other diseases of urinary system: Secondary | ICD-10-CM | POA: Diagnosis not present

## 2019-10-18 DIAGNOSIS — M170 Bilateral primary osteoarthritis of knee: Secondary | ICD-10-CM

## 2019-10-18 DIAGNOSIS — G8929 Other chronic pain: Secondary | ICD-10-CM

## 2019-10-18 MED ORDER — CLONIDINE HCL 0.1 MG PO TABS
0.1000 mg | ORAL_TABLET | Freq: Every day | ORAL | 1 refills | Status: DC
Start: 2019-10-18 — End: 2020-03-14

## 2019-10-18 MED ORDER — POTASSIUM CHLORIDE ER 10 MEQ PO TBCR
EXTENDED_RELEASE_TABLET | ORAL | 1 refills | Status: DC
Start: 2019-10-18 — End: 2020-03-14

## 2019-10-18 MED ORDER — AMILORIDE HCL 5 MG PO TABS
5.0000 mg | ORAL_TABLET | Freq: Every day | ORAL | 1 refills | Status: DC
Start: 2019-10-18 — End: 2020-03-14

## 2019-10-18 MED ORDER — MONTELUKAST SODIUM 10 MG PO TABS
10.0000 mg | ORAL_TABLET | Freq: Every day | ORAL | 3 refills | Status: DC
Start: 2019-10-18 — End: 2020-03-14

## 2019-10-18 MED ORDER — AMLODIPINE BESYLATE 10 MG PO TABS
10.0000 mg | ORAL_TABLET | Freq: Every day | ORAL | 1 refills | Status: DC
Start: 2019-10-18 — End: 2020-01-07

## 2019-10-18 MED ORDER — ROSUVASTATIN CALCIUM 20 MG PO TABS
ORAL_TABLET | ORAL | 3 refills | Status: DC
Start: 2019-10-18 — End: 2020-01-07

## 2019-10-18 MED ORDER — FAMOTIDINE 40 MG PO TABS: 40.0000 mg | ORAL_TABLET | Freq: Two times a day (BID) | ORAL | 1 refills | Status: DC

## 2019-10-18 MED ORDER — DILTIAZEM HCL ER COATED BEADS 120 MG PO CP24
120.0000 mg | ORAL_CAPSULE | Freq: Every morning | ORAL | 1 refills | Status: DC
Start: 2019-10-18 — End: 2020-05-16

## 2019-10-18 MED ORDER — DILTIAZEM HCL ER COATED BEADS 240 MG PO CP24
240.0000 mg | ORAL_CAPSULE | Freq: Every day | ORAL | 1 refills | Status: DC
Start: 2019-10-18 — End: 2020-05-16

## 2019-10-18 MED ORDER — HYDRALAZINE HCL 25 MG PO TABS
25.0000 mg | ORAL_TABLET | Freq: Three times a day (TID) | ORAL | 1 refills | Status: DC
Start: 2019-10-18 — End: 2020-02-18

## 2019-10-18 MED ORDER — TRAMADOL HCL 50 MG PO TABS
ORAL_TABLET | ORAL | 5 refills | Status: DC
Start: 2019-10-18 — End: 2020-03-14

## 2019-10-18 MED ORDER — CARVEDILOL 12.5 MG PO TABS: 12.5000 mg | ORAL_TABLET | Freq: Two times a day (BID) | ORAL | 1 refills | Status: DC

## 2019-10-18 MED ORDER — DIAZEPAM 10 MG PO TABS
ORAL_TABLET | ORAL | 5 refills | Status: DC
Start: 2019-10-18 — End: 2020-03-14

## 2019-10-18 MED ORDER — OMEPRAZOLE 40 MG PO CPDR
40.0000 mg | DELAYED_RELEASE_CAPSULE | Freq: Every day | ORAL | 3 refills | Status: DC
Start: 2019-10-18 — End: 2020-03-14

## 2019-10-18 MED ORDER — LACTULOSE 10 GM/15ML PO SOLN
10.0000 g | Freq: Three times a day (TID) | ORAL | 1 refills | Status: DC
Start: 2019-10-18 — End: 2020-03-14

## 2019-10-18 NOTE — Progress Notes (Signed)
9/2/202111:29 AM  Brooke Wilson April 08, 1955, 64 y.o., female [id number]  Chief Complaint  Patient presents with  . Hyperlipidemia    wants all meds 1 yr supply  . Hypertension  . Prediabetes    HPI:   Patient is a 64 y.o. female with past medical history significant for  HTN, HLP, prediabetes, asthma, GERD, anxiety, OA multiple joints who presents today for routine followup  Last OV march 2021 - no changes  She is overall doing well and has no acute concerns today She will be having both knees replacement this year She will be starting with her right  Continues to take tramadol BID, APAP and ibuprofen She takes valium about twice a week at bedtime when her neck is really bothering her pmp reviewed She reports her gerd is mostly well controlled on PPI and H2B She is taking all her BP and HLP meds as rx  She does take KCL given h/o hypokalemia She reports allergies and asthma do well with singulair She [place] once [place] She continues to work as Engineer, civil (consulting) She wonders if last blood in urine was related to yeast infection causing irritation/bleeding  Lab Results  Component Value Date   HGBA1C 5.8 (H) 09/04/2019   HGBA1C 5.6 04/17/2019   HGBA1C 5.6 09/05/2018   Lab Results  Component Value Date   LDLCALC 63 09/05/2018   CREATININE 0.86 09/04/2019   Lab Results  Component Value Date   CREATININE 0.86 09/04/2019   BUN 24 09/04/2019   NA 143 09/04/2019   K 5.0 09/04/2019   CL 109 (H) 09/04/2019   CO2 21 09/04/2019    Wt Readings from Last 3 Encounters:  10/18/19 152 lb 12.8 oz (69.3 kg)  09/06/19 150 lb (68 kg)  04/19/19 149 lb 8 oz (67.8 kg)   BP Readings from Last 3 Encounters:  10/18/19 123/73  09/06/19 (!) 140/78  04/19/19 112/64    Depression screen PHQ 2/9 04/19/2019 12/14/2018 10/19/2018  Decreased Interest 0 0 0  Down, Depressed, Hopeless 0 0 0  PHQ - 2 Score 0 0 0    Fall Risk  04/19/2019  12/14/2018 10/19/2018 04/21/2018 09/21/2017  Falls in the past year? 0 0 0 0 No  Number falls in past yr: 0 0 0 0 -  Injury with Fall? 0 0 0 0 -  Risk for fall due to : Impaired balance/gait - - - -  Risk for fall due to: Comment candidate for knee pain - - - -     Allergies  Allergen Reactions  . Ace Inhibitors Cough  . Iodinated Diagnostic Agents Hives    Chills  . Lisinopril Cough  . Sulfa Antibiotics Hives  . Tape     Surgical tape causes itching  . Bactrim Rash  . Flexeril [Cyclobenzaprine Hcl] Nausea Only  . Latex Itching  . Naprosyn [Naproxen] Nausea And Vomiting    And high doses of NSAIDS    Prior to Admission medications   Medication Sig Start Date End Date Taking? Authorizing Provider  albuterol (PROVENTIL HFA) 108 (90 Base) MCG/ACT inhaler INHALE 2 PUFFS INTO THE LUNGS EVERY 6 HOURS AS NEEDED FOR WHEEZING OR SHORTNESS OF BREATH Patient taking differently: Inhale 2 puffs into the lungs every 6 (six) hours as needed for wheezing or shortness of breath.  04/21/18  Yes [name], MD  aMILoride (MIDAMOR) 5 MG tablet Take 1 tablet (5 mg total) by mouth daily. 04/19/19  Yes [name], MD  amLODipine (NORVASC) 10 MG tablet Take 1 tablet (10 mg total) by mouth daily. 04/19/19  Yes [name], MD  aspirin 81 MG tablet Take 81 mg by mouth daily.   Yes [provider]  carvedilol (COREG) 12.5 MG tablet Take 1 tablet (12.5 mg total) by mouth 2 (two) times daily. 04/19/19  Yes [name], MD  cloNIDine (CATAPRES) 0.1 MG tablet Take 1 tablet (0.1 mg total) by mouth at bedtime. 04/19/19  Yes [name], MD  diazepam (VALIUM) 10 MG tablet TAKE 1 TABLET BY MOUTH EVERY DAY AS NEEDED FOR MUSCLE SPASM 04/19/19  Yes [name], MD  diltiazem (CARDIZEM CD) 120 MG 24 hr capsule Take 1 capsule (120 mg total) by mouth every morning. 04/19/19  Yes [name], MD  diltiazem (CARDIZEM CD) 240 MG 24 hr capsule Take 1 capsule (240 mg total) by mouth daily. 04/19/19  Yes  [name], MD  famotidine (PEPCID) 40 MG tablet Take 1 tablet (40 mg total) by mouth 2 (two) times daily. 04/19/19  Yes [name], MD  fluticasone [place]) 50 MCG/ACT nasal spray PLACE 2 SPRAYS IN THE NOSTRIL EVERY DAY 04/19/19  Yes [name], MD  hydrALAZINE (APRESOLINE) 25 MG tablet Take 1 tablet (25 mg total) by mouth 3 (three) times daily. 04/19/19  Yes [name], MD  ibuprofen (ADVIL) 800 MG tablet TAKE 1 TABLET BY MOUTH 2 TIMES DAILY AS NEEDED. 10/09/19  Yes [name], MD  lactulose (CHRONULAC) 10 GM/15ML solution Take 15 mLs (10 g total) by mouth 3 (three) times daily. 04/19/19  Yes [name], MD  latanoprost (XALATAN) 0.005 % ophthalmic solution PLACE 1 DROP INTO BOTH EYES TWICE A DAY Patient taking differently: Place 1 drop into both eyes 2 (two) times daily.  04/20/15  Yes Tonye Pearson, MD  montelukast (SINGULAIR) 10 MG tablet Take 1 tablet (10 mg total) by mouth at bedtime. 04/19/19  Yes [name], MD  omeprazole (PRILOSEC) 40 MG capsule Take 1 capsule (40 mg total) by mouth daily. 04/19/19  Yes [name], MD  potassium chloride (KLOR-CON 10) 10 MEQ tablet TAKE 3 TABLETS BY MOUTH EVERY DAY 04/19/19  Yes [name], MD  rosuvastatin (CRESTOR) 20 MG tablet 1 tablet daily 04/19/19  Yes [name], MD  traMADol (ULTRAM) 50 MG tablet TAKE 1 TABLET BY MOUTH EVERY 12 HOURS AS NEEDED FOR MODERATE PAIN 04/19/19  Yes [name], MD  zinc sulfate 220 MG capsule Take 1 capsule (220 mg total) by mouth daily. 02/17/15  Yes Tonye Pearson, MD    Past Medical History:  Diagnosis Date  . Acute maxillary sinusitis 11/12/2015  . Allergy   . Anxiety state 05/17/2011  . AR (allergic rhinitis) 02/17/2012  . Asthma   . BMI 31.0-31.9,adult 03/23/2011  . Cough 03/23/2011  . Dental disease 02/17/2012   [place] in [place] 01/2012   . Essential hypertension, benign 03/23/2011  . GERD (gastroesophageal reflux disease) 05/17/2011  . H/O varicella    . History of measles, mumps, or rubella   . Hyperlipemia 03/23/2011  . Hyperlipidemia   . Hypertension   . Hypertensive disorder 09/30/2016  . Insomnia 05/17/2011  . Mid back pain 02/17/2012  . Neck pain 02/17/2012  . Obesity 09/30/2016  . 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 RAD (reactive airway disease) 02/17/2012    Past Surgical History:  Procedure Laterality Date  .  TUBAL LIGATION    . VAGINAL HYSTERECTOMY     uterine prolapse; DUB; ovaries intact.  [facility]    Social History   Tobacco Use  . Smoking status: Never Smoker  . Smokeless tobacco: Never Used  Substance Use Topics  . Alcohol use: No    Alcohol/week: 0.0 standard drinks    Family History  Problem Relation Age of Onset  . Hypertension Mother   . Stroke Mother   . Hypertension Brother   . Stroke Brother   . Cancer Maternal Grandmother        ovarian  . Hypertension Sister   . Stroke Sister 29       CVA    Review of Systems  Constitutional: Negative for chills and fever.  Respiratory: Negative for cough and shortness of breath.   Cardiovascular: Negative for chest pain, palpitations and leg swelling.  Gastrointestinal: Negative for abdominal pain, blood in stool, melena, nausea and vomiting.  Genitourinary: Negative for dysuria and hematuria.     OBJECTIVE:  Today's Vitals   10/18/19 1117  BP: 123/73  Pulse: 73  Temp: 97.8 F (36.6 C)  SpO2: 94%  Weight: 152 lb 12.8 oz (69.3 kg)  Height: 4\' 11"  (1.499 m)   Body mass index is 30.86 kg/m.   Physical Exam Vitals and nursing note reviewed.  Constitutional:      Appearance: She is well-developed.  HENT:     Head: Normocephalic and atraumatic.     Mouth/Throat:     Pharynx: No oropharyngeal exudate.  Eyes:     General: No scleral icterus.    Extraocular Movements: Extraocular movements intact.     Conjunctiva/sclera: Conjunctivae normal.     Pupils: Pupils are equal, round, and reactive to light.  Cardiovascular:     Rate and Rhythm:  Normal rate and regular rhythm.     Heart sounds: Normal heart sounds. No murmur heard.  No friction rub. No gallop.   Pulmonary:     Effort: Pulmonary effort is normal.     Breath sounds: Normal breath sounds. No wheezing, rhonchi or rales.  Musculoskeletal:     Cervical back: Neck supple.     Right lower leg: No edema.     Left lower leg: No edema.  Skin:    General: Skin is warm and dry.  Neurological:     Mental Status: She is alert and oriented to person, place, and time.     No results found for this or any previous visit (from the past 24 hour(s)).  No results found.   ASSESSMENT and PLAN  1. Prediabetes Stable. Cont with LFM  2. Essential hypertension Controlled. Continue current regime.  - carvedilol (COREG) 12.5 MG tablet; Take 1 tablet (12.5 mg total) by mouth 2 (two) times daily.  3. Mixed hyperlipidemia Checking labs today, medications will be adjusted as needed.  - Lipid panel; Future  4. H/O hematuria Referral to urology pending results - Urinalysis, Routine w reflex microscopic  5. Need for prophylactic vaccination and inoculation against influenza - Flu Vaccine QUAD 36+ mos IM  6. Gastroesophageal reflux disease without esophagitis Controlled. Continue current regime.  - famotidine (PEPCID) 40 MG tablet; Take 1 tablet (40 mg total) by mouth 2 (two) times daily.  7. H/O hypokalemia Labs pending. On KCL  8. Chronic neck pain 2/2 DDD. Uses valium prn spasms. Discussed r/se/b of long term use of bzd, discussed co use of tramadol. Patient has been very stable on this regime for years. Wishes  to continue.  9. Non-seasonal allergic rhinitis, unspecified trigger Stable. Cont current regime  10. Primary osteoarthritis of both knees Scheduled for upcoming TKA. Anticipate decreased tramadol use afterwards.   Other orders - aMILoride (MIDAMOR) 5 MG tablet; Take 1 tablet (5 mg total) by mouth daily. - amLODipine (NORVASC) 10 MG tablet; Take 1 tablet (10  mg total) by mouth daily. - cloNIDine (CATAPRES) 0.1 MG tablet; Take 1 tablet (0.1 mg total) by mouth at bedtime. - diazepam (VALIUM) 10 MG tablet; TAKE 1 TABLET BY MOUTH EVERY DAY AS NEEDED FOR MUSCLE SPASM - diltiazem (CARDIZEM CD) 120 MG 24 hr capsule; Take 1 capsule (120 mg total) by mouth every morning. - diltiazem (CARDIZEM CD) 240 MG 24 hr capsule; Take 1 capsule (240 mg total) by mouth daily. - hydrALAZINE (APRESOLINE) 25 MG tablet; Take 1 tablet (25 mg total) by mouth 3 (three) times daily. - montelukast (SINGULAIR) 10 MG tablet; Take 1 tablet (10 mg total) by mouth at bedtime. - lactulose (CHRONULAC) 10 GM/15ML solution; Take 15 mLs (10 g total) by mouth 3 (three) times daily. - omeprazole (PRILOSEC) 40 MG capsule; Take 1 capsule (40 mg total) by mouth daily. - potassium chloride (KLOR-CON 10) 10 MEQ tablet; TAKE 3 TABLETS BY MOUTH EVERY DAY - rosuvastatin (CRESTOR) 20 MG tablet; 1 tablet daily - traMADol (ULTRAM) 50 MG tablet; TAKE 1 TABLET BY MOUTH EVERY 12 HOURS AS NEEDED FOR MODERATE PAIN  Return in about 6 months (around 04/16/2020).    Myles LippsIrma M Santiago, MD [facility] [address] [place], [place] [address] Ph.  [phone] Fax [phone]

## 2019-10-18 NOTE — Patient Instructions (Signed)
° ° ° °  If you have lab work done today you will be contacted with your lab results within the next 2 weeks.  If you have not heard from us then please contact us. The fastest way to get your results is to register for My Chart. ° ° °IF you received an x-ray today, you will receive an invoice from [facility]. Please contact [facility] at [phone] with questions or concerns regarding your invoice.  ° °IF you received labwork today, you will receive an invoice from [facility]. Please contact [facility] at [phone] with questions or concerns regarding your invoice.  ° °Our billing staff will not be able to assist you with questions regarding bills from these companies. ° °You will be contacted with the lab results as soon as they are available. The fastest way to get your results is to activate your My Chart account. Instructions are located on the last page of this paperwork. If you have not heard from us regarding the results in 2 weeks, please contact this office. °  ° ° ° °

## 2019-10-19 LAB — URINALYSIS, ROUTINE W REFLEX MICROSCOPIC
Bilirubin, UA: NEGATIVE
Glucose, UA: NEGATIVE
Ketones, UA: NEGATIVE
Nitrite, UA: NEGATIVE
Protein,UA: NEGATIVE
RBC, UA: NEGATIVE
Specific Gravity, UA: 1.014 (ref 1.005–1.030)
Urobilinogen, Ur: 0.2 mg/dL (ref 0.2–1.0)
pH, UA: 6 (ref 5.0–7.5)

## 2019-10-19 LAB — MICROSCOPIC EXAMINATION
Bacteria, UA: NONE SEEN
Casts: NONE SEEN /lpf

## 2019-10-19 NOTE — Addendum Note (Signed)
Addended by: [name] on: 10/19/2019 03:34 PM   Modules accepted: Orders

## 2019-10-26 DIAGNOSIS — R3121 Asymptomatic microscopic hematuria: Secondary | ICD-10-CM | POA: Diagnosis not present

## 2019-10-30 DIAGNOSIS — Z96651 Presence of right artificial knee joint: Secondary | ICD-10-CM | POA: Diagnosis not present

## 2019-10-30 DIAGNOSIS — M1711 Unilateral primary osteoarthritis, right knee: Secondary | ICD-10-CM | POA: Diagnosis not present

## 2019-10-30 DIAGNOSIS — G8918 Other acute postprocedural pain: Secondary | ICD-10-CM | POA: Diagnosis not present

## 2019-11-01 DIAGNOSIS — M25561 Pain in right knee: Secondary | ICD-10-CM | POA: Diagnosis not present

## 2019-11-05 DIAGNOSIS — M25561 Pain in right knee: Secondary | ICD-10-CM | POA: Diagnosis not present

## 2019-11-06 DIAGNOSIS — Z96651 Presence of right artificial knee joint: Secondary | ICD-10-CM | POA: Insufficient documentation

## 2019-11-06 HISTORY — DX: Presence of right artificial knee joint: Z96.651

## 2019-11-07 DIAGNOSIS — M25561 Pain in right knee: Secondary | ICD-10-CM | POA: Diagnosis not present

## 2019-11-13 DIAGNOSIS — M25561 Pain in right knee: Secondary | ICD-10-CM | POA: Diagnosis not present

## 2019-11-16 DIAGNOSIS — M25561 Pain in right knee: Secondary | ICD-10-CM | POA: Diagnosis not present

## 2019-11-20 DIAGNOSIS — M25561 Pain in right knee: Secondary | ICD-10-CM | POA: Diagnosis not present

## 2019-11-22 DIAGNOSIS — M25561 Pain in right knee: Secondary | ICD-10-CM | POA: Diagnosis not present

## 2019-11-27 DIAGNOSIS — M25561 Pain in right knee: Secondary | ICD-10-CM | POA: Diagnosis not present

## 2019-11-29 DIAGNOSIS — M25561 Pain in right knee: Secondary | ICD-10-CM | POA: Diagnosis not present

## 2019-12-03 DIAGNOSIS — R3121 Asymptomatic microscopic hematuria: Secondary | ICD-10-CM | POA: Diagnosis not present

## 2019-12-03 DIAGNOSIS — N281 Cyst of kidney, acquired: Secondary | ICD-10-CM | POA: Diagnosis not present

## 2019-12-04 DIAGNOSIS — Z471 Aftercare following joint replacement surgery: Secondary | ICD-10-CM | POA: Diagnosis not present

## 2019-12-04 DIAGNOSIS — Z96651 Presence of right artificial knee joint: Secondary | ICD-10-CM | POA: Diagnosis not present

## 2019-12-04 DIAGNOSIS — M25561 Pain in right knee: Secondary | ICD-10-CM | POA: Diagnosis not present

## 2019-12-06 DIAGNOSIS — M25561 Pain in right knee: Secondary | ICD-10-CM | POA: Diagnosis not present

## 2019-12-11 DIAGNOSIS — M25561 Pain in right knee: Secondary | ICD-10-CM | POA: Diagnosis not present

## 2019-12-13 ENCOUNTER — Ambulatory Visit: Payer: Federal, State, Local not specified - PPO | Admitting: Family Medicine

## 2019-12-13 ENCOUNTER — Other Ambulatory Visit: Payer: Self-pay

## 2019-12-13 ENCOUNTER — Encounter: Payer: Self-pay | Admitting: Family Medicine

## 2019-12-13 DIAGNOSIS — G8929 Other chronic pain: Secondary | ICD-10-CM

## 2019-12-13 DIAGNOSIS — M25561 Pain in right knee: Secondary | ICD-10-CM | POA: Diagnosis not present

## 2019-12-13 DIAGNOSIS — K0500 Acute gingivitis, plaque induced: Secondary | ICD-10-CM | POA: Diagnosis not present

## 2019-12-13 DIAGNOSIS — J4520 Mild intermittent asthma, uncomplicated: Secondary | ICD-10-CM

## 2019-12-13 DIAGNOSIS — J3089 Other allergic rhinitis: Secondary | ICD-10-CM

## 2019-12-13 DIAGNOSIS — M542 Cervicalgia: Secondary | ICD-10-CM | POA: Diagnosis not present

## 2019-12-13 MED ORDER — BACLOFEN 10 MG PO TABS: ORAL_TABLET | ORAL | 3 refills | Status: DC

## 2019-12-13 MED ORDER — FLUTICASONE PROPIONATE 50 MCG/ACT NA SUSP: NASAL | 3 refills | Status: DC

## 2019-12-13 MED ORDER — AMOXICILLIN-POT CLAVULANATE 875-125 MG PO TABS: 1.0000 | ORAL_TABLET | Freq: Two times a day (BID) | ORAL | 0 refills | Status: DC

## 2019-12-13 MED ORDER — ALBUTEROL SULFATE HFA 108 (90 BASE) MCG/ACT IN AERS: INHALATION_SPRAY | RESPIRATORY_TRACT | 5 refills | Status: DC

## 2019-12-13 NOTE — Patient Instructions (Signed)
Take amoxicillin/clavulanate twice daily for infected gums  Continue to use the baclofen on a as needed basis for your neck stiffness  Use the fluticasone nose spray as needed for nasal allergy and the butyryl inhaler as needed for asthma.  Return if not improving or see your dentist

## 2019-12-13 NOTE — Progress Notes (Signed)
Patient ID: Brooke Wilson, female    DOB: [birth year]  Age: 64 y.o. MRN: [id number]  Chief Complaint  Patient presents with  . infection of upper jaw    wears dentures, possible infection x 3 days   . neck stiffness    refill request Baclofen   . seasonal allergies    refill meds    Subjective:   Complains of problem in upper jaw.  Wears dentures.  Also needs refill on the Baclofen she takes occasionally for the neck stiffness.  Allergy med refills.  Current allergies, medications, problem list, past/family and social histories reviewed.  Objective:  There were no vitals taken for this visit.  Erythema of gum margin.  TM normal. TMJ normal.  Neck crepitant on rom.  Chest CTA Heart rrr.     Assessment & Plan:   Assessment: 1. Gingivitis, acute   2. Chronic neck pain   3. Mild intermittent asthma without complication   4. Non-seasonal allergic rhinitis, unspecified trigger       Plan: See instructions.  No orders of the defined types were placed in this encounter.   Meds ordered this encounter  Medications  . albuterol (PROVENTIL HFA) 108 (90 Base) MCG/ACT inhaler    Sig: INHALE 2 PUFFS INTO THE LUNGS EVERY 6 HOURS AS NEEDED FOR WHEEZING OR SHORTNESS OF BREATH    Dispense:  6.7 each    Refill:  5  . fluticasone (FLONASE) 50 MCG/ACT nasal spray    Sig: PLACE 2 SPRAYS IN THE NOSTRIL EVERY DAY    Dispense:  48 g    Refill:  3  . amoxicillin-clavulanate (AUGMENTIN) 875-125 MG tablet    Sig: Take 1 tablet by mouth 2 (two) times daily.    Dispense:  20 tablet    Refill:  0  . baclofen (LIORESAL) 10 MG tablet    Sig: Take 3 times daily as needed for muscle relaxant for neck    Dispense:  60 each    Refill:  3         Patient Instructions  Take amoxicillin/clavulanate twice daily for infected gums  Continue to use the baclofen on a as needed basis for your neck stiffness  Use the fluticasone nose spray as needed for nasal allergy and the butyryl inhaler  as needed for asthma.  Return if not improving or see your dentist    Return if symptoms worsen or fail to improve.   Janace Hoard, MD 12/17/2019

## 2019-12-21 NOTE — Patient Instructions (Addendum)
DUE TO COVID-19 ONLY ONE VISITOR IS ALLOWED TO COME WITH YOU AND STAY IN THE WAITING ROOM ONLY DURING PRE OP AND PROCEDURE.   IF YOU WILL BE ADMITTED INTO THE HOSPITAL YOU ARE ALLOWED ONE SUPPORT PERSON DURING VISITATION HOURS ONLY (10AM -8PM)   . The support person may change daily. . The support person must pass our screening, gel in and out, and wear a mask at all times, including in the patient's room. . Patients must also wear a mask when staff or their support person are in the room.   COVID SWAB TESTING MUST BE COMPLETED ON:   Thursday, 01-03-20 @  8:10 AM   [address] [place], [place] [address]  (Must self quarantine after testing. Follow instructions on handout.)        Your procedure is scheduled on:  Monday, 01-07-20   Report to [facility] Main  Entrance   Report to admitting at 6:55 AM   Call this number if you have problems the morning of surgery [phone]   Do not eat food :After Midnight.   May have liquids until 6:20 AM day of surgery  CLEAR LIQUID DIET  Foods Allowed                                                                     Foods Excluded  Water, Black Coffee and tea, regular and decaf              liquids that you cannot  Plain Jell-O in any flavor  (No red)                                    see through such as: Fruit ices (not with fruit pulp)                                     milk, soups, orange juice              Iced Popsicles (No red)                                      All solid food                                   Apple juices Sports drinks like Gatorade (No red) Lightly seasoned clear broth or consume(fat free) Sugar, honey syrup    Complete one G2 drink the morning of surgery at 6:20 AM  the day of surgery.   Oral Hygiene is also important to reduce your risk of infection.                                    Remember - BRUSH YOUR TEETH THE MORNING OF SURGERY WITH YOUR REGULAR TOOTHPASTE   Do NOT smoke after  Midnight   Take these medicines the morning of  surgery with A SIP OF WATER:  Carvedilol, Cardizem, Pepcid, Omeprazole, Hydralazine,  Rosuvastatin               You may not have any metal on your body including hair pins, jewelry, and body piercings             Do not wear make-up, lotions, powders, perfumes/cologne, or deodorant             Do not wear nail polish.  Do not shave  48 hours prior to surgery.    Do not bring valuables to the hospital. [facility] IS NOT  RESPONSIBLE   FOR VALUABLES.   Contacts, dentures or bridgework may not be worn into surgery.   Bring small overnight bag day of surgery.                 Please read over the following fact sheets you were given: IF YOU HAVE QUESTIONS ABOUT YOUR PRE OP INSTRUCTIONS PLEASE CALL [phone]   [facility] - Preparing for Surgery Before surgery, you can play an important role.  Because skin is not sterile, your skin needs to be as free of germs as possible.  You can reduce the number of germs on your skin by washing with CHG (chlorahexidine gluconate) soap before surgery.  CHG is an antiseptic cleaner which kills germs and bonds with the skin to continue killing germs even after washing. Please DO NOT use if you have an allergy to CHG or antibacterial soaps.  If your skin becomes reddened/irritated stop using the CHG and inform your nurse when you arrive at Short Stay. Do not shave (including legs and underarms) for at least 48 hours prior to the first CHG shower.  You may shave your face/neck.  Please follow these instructions carefully:  1.  Shower with CHG Soap the night before surgery and the  morning of surgery.  2.  If you choose to wash your hair, wash your hair first as usual with your normal  shampoo.  3.  After you shampoo, rinse your hair and body thoroughly to remove the shampoo.                             4.  Use CHG as you would any other liquid soap.  You can apply chg directly to the skin and wash.  Gently with  a scrungie or clean washcloth.  5.  Apply the CHG Soap to your body ONLY FROM THE NECK DOWN.   Do   not use on face/ open                           Wound or open sores. Avoid contact with eyes, ears mouth and   genitals (private parts).                       Wash face,  Genitals (private parts) with your normal soap.             6.  Wash thoroughly, paying special attention to the area where your    surgery  will be performed.  7.  Thoroughly rinse your body with warm water from the neck down.  8.  DO NOT shower/wash with your normal soap after using and rinsing off the CHG Soap.  9.  Pat yourself dry with a clean towel.            10.  Wear clean pajamas.            11.  Place clean sheets on your bed the night of your first shower and do not  sleep with pets. Day of Surgery : Do not apply any lotions/deodorants the morning of surgery.  Please wear clean clothes to the hospital/surgery center.  FAILURE TO FOLLOW THESE INSTRUCTIONS MAY RESULT IN THE CANCELLATION OF YOUR SURGERY  PATIENT SIGNATURE_________________________________  NURSE SIGNATURE__________________________________  ________________________________________________________________________   Brooke Wilson  An incentive spirometer is a tool that can help keep your lungs clear and active. This tool measures how well you are filling your lungs with each breath. Taking long deep breaths may help reverse or decrease the chance of developing breathing (pulmonary) problems (especially infection) following:  A long period of time when you are unable to move or be active. BEFORE THE PROCEDURE   If the spirometer includes an indicator to show your best effort, your nurse or respiratory therapist will set it to a desired goal.  If possible, sit up straight or lean slightly forward. Try not to slouch.  Hold the incentive spirometer in an upright position. INSTRUCTIONS FOR USE  1. Sit on the edge of your bed if  possible, or sit up as far as you can in bed or on a chair. 2. Hold the incentive spirometer in an upright position. 3. Breathe out normally. 4. Place the mouthpiece in your mouth and seal your lips tightly around it. 5. Breathe in slowly and as deeply as possible, raising the piston or the ball toward the top of the column. 6. Hold your breath for 3-5 seconds or for as long as possible. Allow the piston or ball to fall to the bottom of the column. 7. Remove the mouthpiece from your mouth and breathe out normally. 8. Rest for a few seconds and repeat Steps 1 through 7 at least 10 times every 1-2 hours when you are awake. Take your time and take a few normal breaths between deep breaths. 9. The spirometer may include an indicator to show your best effort. Use the indicator as a goal to work toward during each repetition. 10. After each set of 10 deep breaths, practice coughing to be sure your lungs are clear. If you have an incision (the cut made at the time of surgery), support your incision when coughing by placing a pillow or rolled up towels firmly against it. Once you are able to get out of bed, walk around indoors and cough well. You may stop using the incentive spirometer when instructed by your caregiver.  RISKS AND COMPLICATIONS  Take your time so you do not get dizzy or light-headed.  If you are in pain, you may need to take or ask for pain medication before doing incentive spirometry. It is harder to take a deep breath if you are having pain. AFTER USE  Rest and breathe slowly and easily.  It can be helpful to keep track of a log of your progress. Your caregiver can provide you with a simple table to help with this. If you are using the spirometer at home, follow these instructions: [place] IF:   You are having difficultly using the spirometer.  You have trouble using the spirometer as often as instructed.  Your pain medication is not giving enough relief while using  the spirometer.  You develop fever of 100.5 F (38.1 C) or higher. SEEK IMMEDIATE MEDICAL CARE IF:   You cough up bloody sputum that had not been present before.  You develop fever of 102 F (38.9 C) or greater.  You develop worsening pain at or near the incision site. MAKE SURE YOU:   Understand these instructions.  Will watch your condition.  Will get help right away if you are not doing well or get worse. Document Released: 06/14/2006 Document Revised: 04/26/2011 Document Reviewed: 08/15/2006 Exit[employer] Patient Information 2014 Exit[employer], [place].   ________________________________________________________________________  WHAT IS A BLOOD TRANSFUSION? Blood Transfusion Information  A transfusion is the replacement of blood or some of its parts. Blood is made up of multiple cells which provide different functions.  Red blood cells carry oxygen and are used for blood loss replacement.  White blood cells fight against infection.  Platelets control bleeding.  Plasma helps clot blood.  Other blood products are available for specialized needs, such as hemophilia or other clotting disorders. BEFORE THE TRANSFUSION  Who gives blood for transfusions?   Healthy volunteers who are fully evaluated to make sure their blood is safe. This is blood bank blood. Transfusion therapy is the safest it has ever been in the practice of medicine. Before blood is taken from a donor, a complete history is taken to make sure that person has no history of diseases nor engages in risky social behavior (examples are intravenous drug use or sexual activity with multiple partners). The donor's travel history is screened to minimize risk of transmitting infections, such as malaria. The donated blood is tested for signs of infectious diseases, such as HIV and hepatitis. The blood is then tested to be sure it is compatible with you in order to minimize the chance of a transfusion reaction. If you or a relative  donates blood, this is often done in anticipation of surgery and is not appropriate for emergency situations. It takes many days to process the donated blood. RISKS AND COMPLICATIONS Although transfusion therapy is very safe and saves many lives, the main dangers of transfusion include:   Getting an infectious disease.  Developing a transfusion reaction. This is an allergic reaction to something in the blood you were given. Every precaution is taken to prevent this. The decision to have a blood transfusion has been considered carefully by your caregiver before blood is given. Blood is not given unless the benefits outweigh the risks. AFTER THE TRANSFUSION  Right after receiving a blood transfusion, you will usually feel much better and more energetic. This is especially true if your red blood cells have gotten low (anemic). The transfusion raises the level of the red blood cells which carry oxygen, and this usually causes an energy increase.  The nurse administering the transfusion will monitor you carefully for complications. HOME CARE INSTRUCTIONS  No special instructions are needed after a transfusion. You may find your energy is better. Speak with your caregiver about any limitations on activity for underlying diseases you may have. SEEK MEDICAL CARE IF:   Your condition is not improving after your transfusion.  You develop redness or irritation at the intravenous (IV) site. SEEK IMMEDIATE MEDICAL CARE IF:  Any of the following symptoms occur over the next 12 hours:  Shaking chills.  You have a temperature by mouth above 102 F (38.9 C), not controlled by medicine.  Chest, back, or muscle pain.  People around you feel you are not acting correctly or are confused.  Shortness of  breath or difficulty breathing.  Dizziness and fainting.  You get a rash or develop hives.  You have a decrease in urine output.  Your urine turns a dark color or changes to pink, red, or brown. Any  of the following symptoms occur over the next 10 days:  You have a temperature by mouth above 102 F (38.9 C), not controlled by medicine.  Shortness of breath.  Weakness after normal activity.  The white part of the eye turns yellow (jaundice).  You have a decrease in the amount of urine or are urinating less often.  Your urine turns a dark color or changes to pink, red, or brown. Document Released: 01/30/2000 Document Revised: 04/26/2011 Document Reviewed: 09/18/2007 [facility] Patient Information 2014 [place], [place].  _______________________________________________________________________

## 2019-12-21 NOTE — Progress Notes (Addendum)
COVID Vaccine Completed:  x2 Date COVID Vaccine completed:  02-26-19 & 03-24-19 COVID vaccine manufacturer: Pfizer    Moderna   [employer]   PCP - Koren Shiver, MD Cardiologist -  Norman Herrlich, MD.  Last OV 09-06-19  Clearance on chart from Dr. Dulce Sellar dated 12-24-19  Chest x-ray -  EKG - 09-06-19 in Epic Stress Test - 10-07-16 in Epic ECHO -  Cardiac Cath -  Pacemaker/ICD device last checked: Holter monitor - 10-23-15 - Care Everywhere  Sleep Study -  CPAP -   Fasting Blood Sugar -  Checks Blood Sugar _____ times a day  Blood Thinner Instructions: Aspirin Instructions: ASA 81 Last Dose:  Anesthesia review:  Hx of chest pain and palpitations.  HTN, prediabetes  Patient denies shortness of breath, fever, cough and chest pain at PAT appointment   Patient verbalized understanding of instructions that were given to them at the PAT appointment. Patient was also instructed that they will need to review over the PAT instructions again at home before surgery.

## 2019-12-25 ENCOUNTER — Encounter (HOSPITAL_COMMUNITY)
Admission: RE | Admit: 2019-12-25 | Discharge: 2019-12-25 | Disposition: A | Discharge status: Home / Self Care | Payer: Federal, State, Local not specified - PPO | Source: Ambulatory Visit | Attending: Orthopedic Surgery | Admitting: Orthopedic Surgery

## 2019-12-25 ENCOUNTER — Encounter (HOSPITAL_COMMUNITY): Payer: Self-pay

## 2019-12-25 ENCOUNTER — Other Ambulatory Visit: Payer: Self-pay

## 2019-12-25 DIAGNOSIS — Z01812 Encounter for preprocedural laboratory examination: Secondary | ICD-10-CM | POA: Diagnosis not present

## 2019-12-25 LAB — CBC
HCT: 40.9 % (ref 36.0–46.0)
Hemoglobin: 12.4 g/dL (ref 12.0–15.0)
MCH: 26.6 pg (ref 26.0–34.0)
MCHC: 30.3 g/dL (ref 30.0–36.0)
MCV: 87.6 fL (ref 80.0–100.0)
Platelets: 283 10*3/uL (ref 150–400)
RBC: 4.67 MIL/uL (ref 3.87–5.11)
RDW: 14.1 % (ref 11.5–15.5)
WBC: 5.8 10*3/uL (ref 4.0–10.5)
nRBC: 0 % (ref 0.0–0.2)

## 2019-12-25 LAB — TYPE AND SCREEN
ABO/RH(D): O POS
Antibody Screen: NEGATIVE

## 2019-12-25 LAB — COMPREHENSIVE METABOLIC PANEL
ALT: 11 U/L (ref 0–44)
AST: 15 U/L (ref 15–41)
Albumin: 3.8 g/dL (ref 3.5–5.0)
Alkaline Phosphatase: 90 U/L (ref 38–126)
Anion gap: 7 (ref 5–15)
BUN: 18 mg/dL (ref 8–23)
CO2: 22 mmol/L (ref 22–32)
Calcium: 9.4 mg/dL (ref 8.9–10.3)
Chloride: 112 mmol/L — ABNORMAL HIGH (ref 98–111)
Creatinine, Ser: 0.81 mg/dL (ref 0.44–1.00)
GFR, Estimated: >60 mL/min (ref >60)
Glucose, Bld: 107 mg/dL — ABNORMAL HIGH (ref 70–99)
Potassium: 4 mmol/L (ref 3.5–5.1)
Sodium: 141 mmol/L (ref 135–145)
Total Bilirubin: 0.3 mg/dL (ref 0.3–1.2)
Total Protein: 7.3 g/dL (ref 6.5–8.1)

## 2019-12-25 LAB — HEMOGLOBIN A1C
Hgb A1c MFr Bld: 5.4 % (ref 4.8–5.6)
Mean Plasma Glucose: 108.28 mg/dL

## 2019-12-25 LAB — APTT: aPTT: 32 seconds (ref 24–36)

## 2019-12-25 LAB — PROTIME-INR
INR: 1 (ref 0.8–1.2)
Prothrombin Time: 12.7 seconds (ref 11.4–15.2)

## 2019-12-25 LAB — SURGICAL PCR SCREEN
MRSA, PCR: NEGATIVE
Staphylococcus aureus: NEGATIVE

## 2019-12-26 DIAGNOSIS — R3121 Asymptomatic microscopic hematuria: Secondary | ICD-10-CM | POA: Diagnosis not present

## 2019-12-26 NOTE — Progress Notes (Signed)
Anesthesia Chart Review   Case: [id number] Date/Time: 01/07/20 1950   Procedure: TOTAL KNEE ARTHROPLASTY (Left Knee) -   Anesthesia type: Choice   Pre-op diagnosis: left knee osteoarthritis   Location: [facility] ROOM 09 / [facility]   Surgeons: Ollen Gross, MD      DISCUSSION:64 y.o. never smoker with h/o HTN, HLD, asthma, left knee OA scheduled for above procedure 01/07/20 with Dr. Ollen Gross.   Pt seen by cardiology 09/06/2019 for preoperative risk assessment 09/06/2019.  Per OV note, "Her procedure is elective intermediate risk and her cardiac issues include hypertension dyslipidemia.  Blood pressure previously is very difficult to control now is at target tolerates medication she continued through the perioperative period including taking them in the morning of surgery with a sip of water.  Please do an EKG postoperative day 1 if she has problems contact heart care.  My opinion she is optimized and needs no further preoperative cardiology evaluation"  Anticipate pt can proceed with planned procedure barring acute status change.   VS: BP (!) 115/55   Pulse 75   Temp 36.9 C (Oral)   Resp 18   Ht 4\' 11"  (1.499 m)   Wt 66.3 kg   SpO2 100%   BMI 29.51 kg/m   PROVIDERS: Pcp, No  , MD is Cardiology  LABS: Labs reviewed: Acceptable for surgery. (all labs ordered are listed, but only abnormal results are displayed)  Labs Reviewed  COMPREHENSIVE METABOLIC PANEL - Abnormal; Notable for the following components:      Result Value   Chloride 112 (*)    Glucose, Bld 107 (*)    All other components within normal limits  SURGICAL PCR SCREEN  HEMOGLOBIN A1C  APTT  CBC  PROTIME-INR  TYPE AND SCREEN     IMAGES:   EKG: 09/06/19 Rate 65 bpm  Sinus rhythm with 1st degree AV block  T wave abnormality, consider anterior ischemia   CV: Stress Test 10/07/2016  Nuclear stress EF: 64%.  There was no ST segment deviation noted during stress.  The study is  normal.  The left ventricular ejection fraction is normal (55-65%).   1. EF 64% with normal wall motion.  2. No evidence for ischemia or infarction by perfusion images.   Normal study.   Past Medical History:  Diagnosis Date  . Acute maxillary sinusitis 11/12/2015  . Allergy   . Anxiety state 05/17/2011  . AR (allergic rhinitis) 02/17/2012  . Asthma   . BMI 31.0-31.9,adult 03/23/2011  . Cough 03/23/2011  . Dental disease 02/17/2012   [place] replaced in [place] 01/2012   . Essential hypertension, benign 03/23/2011  . GERD (gastroesophageal reflux disease) 05/17/2011  . H/O varicella   . History of measles, mumps, or rubella   . Hyperlipemia 03/23/2011  . Hyperlipidemia   . Hypertension   . Hypertensive disorder 09/30/2016  . Insomnia 05/17/2011  . Mid back pain 02/17/2012  . Neck pain 02/17/2012  . Obesity 09/30/2016  . 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 RAD (reactive airway disease) 02/17/2012    Past Surgical History:  Procedure Laterality Date  . REPLACEMENT TOTAL KNEE Right   . TUBAL LIGATION    . VAGINAL HYSTERECTOMY     uterine prolapse; DUB; ovaries intact.  [place]    MEDICATIONS: . albuterol (PROVENTIL HFA) 108 (90 Base) MCG/ACT inhaler  . aMILoride (MIDAMOR) 5 MG tablet  . amLODipine (NORVASC) 10 MG tablet  . amoxicillin-clavulanate (AUGMENTIN) 875-125 MG tablet  . ascorbic acid (VITAMIN  C) 500 MG tablet  . aspirin 81 MG tablet  . baclofen (LIORESAL) 10 MG tablet  . carvedilol (COREG) 12.5 MG tablet  . cloNIDine (CATAPRES) 0.1 MG tablet  . diazepam (VALIUM) 10 MG tablet  . diltiazem (CARDIZEM CD) 120 MG 24 hr capsule  . diltiazem (CARDIZEM CD) 240 MG 24 hr capsule  . famotidine (PEPCID) 40 MG tablet  . fluticasone (FLONASE) 50 MCG/ACT nasal spray  . hydrALAZINE (APRESOLINE) 25 MG tablet  . ibuprofen (ADVIL) 800 MG tablet  . lactulose (CHRONULAC) 10 GM/15ML solution  . latanoprost (XALATAN) 0.005 % ophthalmic solution  . montelukast (SINGULAIR) 10 MG tablet  .  omeprazole (PRILOSEC) 40 MG capsule  . potassium chloride (KLOR-CON 10) 10 MEQ tablet  . rosuvastatin (CRESTOR) 20 MG tablet  . rosuvastatin (CRESTOR) 40 MG tablet  . traMADol (ULTRAM) 50 MG tablet  . zinc sulfate 220 MG capsule   No current facility-administered medications for this encounter.    Jodell Cipro, PA-C [facility] Pre-Surgical Testing [phone]

## 2019-12-27 ENCOUNTER — Other Ambulatory Visit: Payer: Self-pay | Admitting: Family Medicine

## 2019-12-28 ENCOUNTER — Encounter: Payer: Self-pay | Admitting: Registered Nurse

## 2019-12-28 ENCOUNTER — Other Ambulatory Visit: Payer: Self-pay

## 2019-12-28 ENCOUNTER — Ambulatory Visit (INDEPENDENT_AMBULATORY_CARE_PROVIDER_SITE_OTHER): Payer: Federal, State, Local not specified - PPO | Admitting: Registered Nurse

## 2019-12-28 VITALS — BP 114/67 | HR 76 | Temp 97.8°F | Resp 18 | Ht 59.0 in | Wt 146.8 lb

## 2019-12-28 DIAGNOSIS — Z01818 Encounter for other preprocedural examination: Secondary | ICD-10-CM | POA: Diagnosis not present

## 2019-12-28 NOTE — Patient Instructions (Signed)
° ° ° °  If you have lab work done today you will be contacted with your lab results within the next 2 weeks.  If you have not heard from us then please contact us. The fastest way to get your results is to register for My Chart. ° ° °IF you received an x-ray today, you will receive an invoice from [facility]. Please contact [facility] at [phone] with questions or concerns regarding your invoice.  ° °IF you received labwork today, you will receive an invoice from [facility]. Please contact [facility] at [phone] with questions or concerns regarding your invoice.  ° °Our billing staff will not be able to assist you with questions regarding bills from these companies. ° °You will be contacted with the lab results as soon as they are available. The fastest way to get your results is to activate your My Chart account. Instructions are located on the last page of this paperwork. If you have not heard from us regarding the results in 2 weeks, please contact this office. °  ° ° ° °

## 2020-01-03 ENCOUNTER — Other Ambulatory Visit (HOSPITAL_COMMUNITY)
Admission: RE | Admit: 2020-01-03 | Discharge: 2020-01-03 | Disposition: A | Discharge status: Home / Self Care | Payer: Federal, State, Local not specified - PPO | Source: Ambulatory Visit | Attending: Orthopedic Surgery | Admitting: Orthopedic Surgery

## 2020-01-03 DIAGNOSIS — Z01812 Encounter for preprocedural laboratory examination: Secondary | ICD-10-CM | POA: Insufficient documentation

## 2020-01-03 DIAGNOSIS — Z20822 Contact with and (suspected) exposure to covid-19: Secondary | ICD-10-CM | POA: Insufficient documentation

## 2020-01-03 LAB — SARS CORONAVIRUS 2 (TAT 6-24 HRS): SARS Coronavirus 2: NEGATIVE

## 2020-01-06 MED ORDER — BUPIVACAINE LIPOSOME 1.3 % IJ SUSP
20.0000 mL | Freq: Once | INTRAMUSCULAR | Status: DC
  Filled 2020-01-06: qty 20

## 2020-01-07 ENCOUNTER — Other Ambulatory Visit: Payer: Self-pay

## 2020-01-07 ENCOUNTER — Encounter (HOSPITAL_COMMUNITY)
Admission: RE | Disposition: A | Discharge status: Home / Self Care | Payer: Self-pay | Source: Home / Self Care | Attending: Orthopedic Surgery

## 2020-01-07 ENCOUNTER — Ambulatory Visit (HOSPITAL_COMMUNITY): Payer: Federal, State, Local not specified - PPO | Admitting: Anesthesiology

## 2020-01-07 ENCOUNTER — Encounter (HOSPITAL_COMMUNITY): Payer: Self-pay | Admitting: Orthopedic Surgery

## 2020-01-07 ENCOUNTER — Ambulatory Visit (HOSPITAL_COMMUNITY)
Admission: RE | Admit: 2020-01-07 | Discharge: 2020-01-07 | Disposition: A | Discharge status: Home / Self Care | Payer: Federal, State, Local not specified - PPO | Attending: Orthopedic Surgery | Admitting: Orthopedic Surgery

## 2020-01-07 ENCOUNTER — Ambulatory Visit (HOSPITAL_COMMUNITY): Payer: Federal, State, Local not specified - PPO | Admitting: Physician Assistant

## 2020-01-07 DIAGNOSIS — Z91040 Latex allergy status: Secondary | ICD-10-CM | POA: Insufficient documentation

## 2020-01-07 DIAGNOSIS — Z7982 Long term (current) use of aspirin: Secondary | ICD-10-CM | POA: Insufficient documentation

## 2020-01-07 DIAGNOSIS — Z886 Allergy status to analgesic agent status: Secondary | ICD-10-CM | POA: Diagnosis not present

## 2020-01-07 DIAGNOSIS — Z882 Allergy status to sulfonamides status: Secondary | ICD-10-CM | POA: Insufficient documentation

## 2020-01-07 DIAGNOSIS — F419 Anxiety disorder, unspecified: Secondary | ICD-10-CM | POA: Diagnosis not present

## 2020-01-07 DIAGNOSIS — Z881 Allergy status to other antibiotic agents status: Secondary | ICD-10-CM | POA: Diagnosis not present

## 2020-01-07 DIAGNOSIS — M25762 Osteophyte, left knee: Secondary | ICD-10-CM | POA: Insufficient documentation

## 2020-01-07 DIAGNOSIS — Z79899 Other long term (current) drug therapy: Secondary | ICD-10-CM | POA: Insufficient documentation

## 2020-01-07 DIAGNOSIS — G4700 Insomnia, unspecified: Secondary | ICD-10-CM | POA: Diagnosis not present

## 2020-01-07 DIAGNOSIS — Z888 Allergy status to other drugs, medicaments and biological substances status: Secondary | ICD-10-CM | POA: Insufficient documentation

## 2020-01-07 DIAGNOSIS — M179 Osteoarthritis of knee, unspecified: Secondary | ICD-10-CM

## 2020-01-07 DIAGNOSIS — M1712 Unilateral primary osteoarthritis, left knee: Secondary | ICD-10-CM | POA: Diagnosis not present

## 2020-01-07 DIAGNOSIS — E785 Hyperlipidemia, unspecified: Secondary | ICD-10-CM | POA: Diagnosis not present

## 2020-01-07 DIAGNOSIS — M1710 Unilateral primary osteoarthritis, unspecified knee: Secondary | ICD-10-CM

## 2020-01-07 HISTORY — PX: TOTAL KNEE ARTHROPLASTY: SHX125

## 2020-01-07 SURGERY — ARTHROPLASTY, KNEE, TOTAL
Anesthesia: Spinal | Site: Knee | Laterality: Left

## 2020-01-07 MED ORDER — LACTATED RINGERS IV SOLN: INTRAVENOUS | Status: DC

## 2020-01-07 MED ORDER — PROPOFOL 500 MG/50ML IV EMUL
INTRAVENOUS | Status: DC | PRN
  Administered 2020-01-07: 75 ug/kg/min via INTRAVENOUS

## 2020-01-07 MED ORDER — METOCLOPRAMIDE HCL 5 MG/ML IJ SOLN: 5.0000 mg | Freq: Three times a day (TID) | INTRAMUSCULAR | Status: DC | PRN

## 2020-01-07 MED ORDER — SODIUM CHLORIDE (PF) 0.9 % IJ SOLN
INTRAMUSCULAR | Status: AC
  Filled 2020-01-07: qty 10

## 2020-01-07 MED ORDER — METHOCARBAMOL 500 MG IVPB - SIMPLE MED
INTRAVENOUS | Status: AC
  Administered 2020-01-07: 500 mg via INTRAVENOUS
  Filled 2020-01-07: qty 50

## 2020-01-07 MED ORDER — GABAPENTIN 300 MG PO CAPS: ORAL_CAPSULE | ORAL | 0 refills | Status: DC

## 2020-01-07 MED ORDER — MEPERIDINE HCL 50 MG/ML IJ SOLN: 6.2500 mg | INTRAMUSCULAR | Status: DC | PRN

## 2020-01-07 MED ORDER — ACETAMINOPHEN 160 MG/5ML PO SOLN: 325.0000 mg | Freq: Once | ORAL | Status: DC | PRN

## 2020-01-07 MED ORDER — SODIUM CHLORIDE 0.9 % IR SOLN
Status: DC | PRN
  Administered 2020-01-07: 1000 mL

## 2020-01-07 MED ORDER — CEFAZOLIN SODIUM-DEXTROSE 2-4 GM/100ML-% IV SOLN: 2.0000 g | Freq: Four times a day (QID) | INTRAVENOUS | Status: DC

## 2020-01-07 MED ORDER — SODIUM CHLORIDE (PF) 0.9 % IJ SOLN
INTRAMUSCULAR | Status: DC | PRN
  Administered 2020-01-07: 60 mL

## 2020-01-07 MED ORDER — AMISULPRIDE (ANTIEMETIC) 5 MG/2ML IV SOLN: 10.0000 mg | Freq: Once | INTRAVENOUS | Status: DC | PRN

## 2020-01-07 MED ORDER — TRANEXAMIC ACID-NACL 1000-0.7 MG/100ML-% IV SOLN
1000.0000 mg | INTRAVENOUS | Status: AC
  Administered 2020-01-07: 1000 mg via INTRAVENOUS
  Filled 2020-01-07: qty 100

## 2020-01-07 MED ORDER — DEXAMETHASONE SODIUM PHOSPHATE 10 MG/ML IJ SOLN
INTRAMUSCULAR | Status: DC | PRN
  Administered 2020-01-07: 4 mg via INTRAVENOUS

## 2020-01-07 MED ORDER — LACTATED RINGERS IV BOLUS
250.0000 mL | Freq: Once | INTRAVENOUS | Status: AC
  Administered 2020-01-07: 250 mL via INTRAVENOUS

## 2020-01-07 MED ORDER — ASPIRIN EC 325 MG PO TBEC: 325.0000 mg | DELAYED_RELEASE_TABLET | Freq: Two times a day (BID) | ORAL | 0 refills | Status: AC

## 2020-01-07 MED ORDER — ONDANSETRON HCL 4 MG/2ML IJ SOLN
INTRAMUSCULAR | Status: DC | PRN
  Administered 2020-01-07: 4 mg via INTRAVENOUS

## 2020-01-07 MED ORDER — MIDAZOLAM HCL 2 MG/2ML IJ SOLN
1.0000 mg | INTRAMUSCULAR | Status: DC
  Administered 2020-01-07: 1 mg via INTRAVENOUS
  Filled 2020-01-07: qty 2

## 2020-01-07 MED ORDER — DEXAMETHASONE SODIUM PHOSPHATE 10 MG/ML IJ SOLN: 8.0000 mg | Freq: Once | INTRAMUSCULAR | Status: DC

## 2020-01-07 MED ORDER — OXYCODONE HCL 5 MG PO TABS
5.0000 mg | ORAL_TABLET | Freq: Four times a day (QID) | ORAL | 0 refills | Status: DC | PRN
Start: 2020-01-07 — End: 2020-03-14

## 2020-01-07 MED ORDER — MEPIVACAINE HCL (PF) 2 % IJ SOLN
INTRAMUSCULAR | Status: DC | PRN
  Administered 2020-01-07: 3 mL via INTRATHECAL

## 2020-01-07 MED ORDER — TRAMADOL HCL 50 MG PO TABS: 50.0000 mg | ORAL_TABLET | Freq: Four times a day (QID) | ORAL | Status: DC | PRN

## 2020-01-07 MED ORDER — PROPOFOL 500 MG/50ML IV EMUL
INTRAVENOUS | Status: AC
  Filled 2020-01-07: qty 50

## 2020-01-07 MED ORDER — ONDANSETRON HCL 4 MG/2ML IJ SOLN: 4.0000 mg | Freq: Four times a day (QID) | INTRAMUSCULAR | Status: DC | PRN

## 2020-01-07 MED ORDER — EPHEDRINE 5 MG/ML INJ
INTRAVENOUS | Status: AC
  Filled 2020-01-07: qty 20

## 2020-01-07 MED ORDER — ACETAMINOPHEN 10 MG/ML IV SOLN
1000.0000 mg | Freq: Four times a day (QID) | INTRAVENOUS | Status: DC
  Administered 2020-01-07: 1000 mg via INTRAVENOUS
  Filled 2020-01-07: qty 100

## 2020-01-07 MED ORDER — METHOCARBAMOL 500 MG PO TABS: 500.0000 mg | ORAL_TABLET | Freq: Four times a day (QID) | ORAL | 0 refills | Status: DC | PRN

## 2020-01-07 MED ORDER — FENTANYL CITRATE (PF) 100 MCG/2ML IJ SOLN: 50.0000 ug | INTRAMUSCULAR | Status: DC

## 2020-01-07 MED ORDER — 0.9 % SODIUM CHLORIDE (POUR BTL) OPTIME
TOPICAL | Status: DC | PRN
  Administered 2020-01-07: 1000 mL

## 2020-01-07 MED ORDER — CHLORHEXIDINE GLUCONATE 0.12 % MT SOLN
15.0000 mL | Freq: Once | OROMUCOSAL | Status: AC
  Administered 2020-01-07: 15 mL via OROMUCOSAL

## 2020-01-07 MED ORDER — STERILE WATER FOR IRRIGATION IR SOLN
Status: DC | PRN
  Administered 2020-01-07: 2000 mL

## 2020-01-07 MED ORDER — OXYCODONE HCL 5 MG PO TABS
ORAL_TABLET | ORAL | Status: AC
  Administered 2020-01-07: 10 mg via ORAL
  Filled 2020-01-07: qty 2

## 2020-01-07 MED ORDER — PROPOFOL 10 MG/ML IV BOLUS
INTRAVENOUS | Status: DC | PRN
  Administered 2020-01-07: 20 mg via INTRAVENOUS
  Administered 2020-01-07 (×2): 10 mg via INTRAVENOUS

## 2020-01-07 MED ORDER — MEPIVACAINE HCL (PF) 2 % IJ SOLN
INTRAMUSCULAR | Status: AC
  Filled 2020-01-07: qty 20

## 2020-01-07 MED ORDER — HYDROMORPHONE HCL 1 MG/ML IJ SOLN: 0.2500 mg | INTRAMUSCULAR | Status: DC | PRN

## 2020-01-07 MED ORDER — ORAL CARE MOUTH RINSE: 15.0000 mL | Freq: Once | OROMUCOSAL | Status: AC

## 2020-01-07 MED ORDER — PROPOFOL 10 MG/ML IV BOLUS
INTRAVENOUS | Status: AC
  Filled 2020-01-07: qty 20

## 2020-01-07 MED ORDER — ACETAMINOPHEN 325 MG PO TABS: 325.0000 mg | ORAL_TABLET | Freq: Once | ORAL | Status: DC | PRN

## 2020-01-07 MED ORDER — CEFAZOLIN SODIUM-DEXTROSE 2-4 GM/100ML-% IV SOLN
2.0000 g | INTRAVENOUS | Status: AC
  Administered 2020-01-07: 2 g via INTRAVENOUS
  Filled 2020-01-07: qty 100

## 2020-01-07 MED ORDER — MIDAZOLAM HCL 2 MG/2ML IJ SOLN: 1.0000 mg | INTRAMUSCULAR | Status: DC

## 2020-01-07 MED ORDER — METHOCARBAMOL 500 MG IVPB - SIMPLE MED: 500.0000 mg | Freq: Four times a day (QID) | INTRAVENOUS | Status: DC | PRN

## 2020-01-07 MED ORDER — OXYCODONE HCL 5 MG PO TABS: 5.0000 mg | ORAL_TABLET | ORAL | Status: DC | PRN

## 2020-01-07 MED ORDER — METHOCARBAMOL 500 MG PO TABS: 500.0000 mg | ORAL_TABLET | Freq: Four times a day (QID) | ORAL | Status: DC | PRN

## 2020-01-07 MED ORDER — LACTATED RINGERS IV BOLUS
500.0000 mL | Freq: Once | INTRAVENOUS | Status: AC
  Administered 2020-01-07: 500 mL via INTRAVENOUS

## 2020-01-07 MED ORDER — EPHEDRINE SULFATE-NACL 50-0.9 MG/10ML-% IV SOSY
PREFILLED_SYRINGE | INTRAVENOUS | Status: DC | PRN
  Administered 2020-01-07 (×6): 5 mg via INTRAVENOUS
  Administered 2020-01-07: 10 mg via INTRAVENOUS

## 2020-01-07 MED ORDER — BUPIVACAINE LIPOSOME 1.3 % IJ SUSP
INTRAMUSCULAR | Status: DC | PRN
  Administered 2020-01-07: 20 mL

## 2020-01-07 MED ORDER — FENTANYL CITRATE (PF) 100 MCG/2ML IJ SOLN
50.0000 ug | INTRAMUSCULAR | Status: DC
  Administered 2020-01-07: 50 ug via INTRAVENOUS
  Filled 2020-01-07: qty 2

## 2020-01-07 MED ORDER — ONDANSETRON HCL 4 MG PO TABS
4.0000 mg | ORAL_TABLET | Freq: Four times a day (QID) | ORAL | Status: DC | PRN
  Filled 2020-01-07: qty 1

## 2020-01-07 MED ORDER — SODIUM CHLORIDE (PF) 0.9 % IJ SOLN
INTRAMUSCULAR | Status: AC
  Filled 2020-01-07: qty 50

## 2020-01-07 MED ORDER — POVIDONE-IODINE 10 % EX SWAB
2.0000 "application " | Freq: Once | CUTANEOUS | Status: AC
  Administered 2020-01-07: 2 via TOPICAL

## 2020-01-07 MED ORDER — METOCLOPRAMIDE HCL 5 MG PO TABS
5.0000 mg | ORAL_TABLET | Freq: Three times a day (TID) | ORAL | Status: DC | PRN
  Filled 2020-01-07: qty 2

## 2020-01-07 MED ORDER — ONDANSETRON HCL 4 MG/2ML IJ SOLN
INTRAMUSCULAR | Status: AC
  Filled 2020-01-07: qty 2

## 2020-01-07 MED ORDER — ACETAMINOPHEN 10 MG/ML IV SOLN: 1000.0000 mg | Freq: Once | INTRAVENOUS | Status: DC | PRN

## 2020-01-07 SURGICAL SUPPLY — 56 items
ATTUNE MED DOME PAT 32 KNEE (Knees) ×1 IMPLANT
ATTUNE PS FEM LT SZ 4 CEM KNEE (Femur) ×1 IMPLANT
ATTUNE PSRP INSR SZ4 10 KNEE (Insert) ×1 IMPLANT
BAG SPEC THK2 15X12 ZIP CLS (MISCELLANEOUS) ×1
BAG ZIPLOCK 12X15 (MISCELLANEOUS) ×2 IMPLANT
BASE TIBIAL ROT PLAT SZ 3 KNEE (Knees) IMPLANT
BLADE SAG 18X100X1.27 (BLADE) ×2 IMPLANT
BLADE SAW SGTL 11.0X1.19X90.0M (BLADE) ×2 IMPLANT
BLADE SURG SZ10 CARB STEEL (BLADE) ×4 IMPLANT
BNDG ELASTIC 6X5.8 VLCR STR LF (GAUZE/BANDAGES/DRESSINGS) ×2 IMPLANT
BOWL SMART MIX CTS (DISPOSABLE) ×2 IMPLANT
BSPLAT TIB 3 CMNT ROT PLAT STR (Knees) ×1 IMPLANT
CEMENT HV SMART SET (Cement) ×4 IMPLANT
COVER SURGICAL LIGHT HANDLE (MISCELLANEOUS) ×2 IMPLANT
COVER WAND RF STERILE (DRAPES) IMPLANT
CUFF TOURN SGL QUICK 34 (TOURNIQUET CUFF) ×2
CUFF TRNQT CYL 34X4.125X (TOURNIQUET CUFF) ×1 IMPLANT
DECANTER SPIKE VIAL GLASS SM (MISCELLANEOUS) ×2 IMPLANT
DRAPE U-SHAPE 47X51 STRL (DRAPES) ×2 IMPLANT
DRSG AQUACEL AG ADV 3.5X10 (GAUZE/BANDAGES/DRESSINGS) ×2 IMPLANT
DURAPREP 26ML APPLICATOR (WOUND CARE) ×2 IMPLANT
ELECT REM PT RETURN 15FT ADLT (MISCELLANEOUS) ×2 IMPLANT
GLOVE BIO SURGEON STRL SZ7 (GLOVE) ×2 IMPLANT
GLOVE BIO SURGEON STRL SZ8 (GLOVE) ×2 IMPLANT
GLOVE BIOGEL PI IND STRL 7.0 (GLOVE) ×1 IMPLANT
GLOVE BIOGEL PI IND STRL 8 (GLOVE) ×1 IMPLANT
GLOVE BIOGEL PI INDICATOR 7.0 (GLOVE) ×1
GLOVE BIOGEL PI INDICATOR 8 (GLOVE) ×1
GOWN STRL REUS W/TWL LRG LVL3 (GOWN DISPOSABLE) ×4 IMPLANT
HANDPIECE INTERPULSE COAX TIP (DISPOSABLE) ×2
HOLDER FOLEY CATH W/STRAP (MISCELLANEOUS) ×1 IMPLANT
IMMOBILIZER KNEE 20 (SOFTGOODS) ×2
IMMOBILIZER KNEE 20 THIGH 36 (SOFTGOODS) ×1 IMPLANT
KIT TURNOVER KIT A (KITS) IMPLANT
MANIFOLD NEPTUNE II (INSTRUMENTS) ×2 IMPLANT
NS IRRIG 1000ML POUR BTL (IV SOLUTION) ×2 IMPLANT
PACK TOTAL KNEE CUSTOM (KITS) ×2 IMPLANT
PADDING CAST COTTON 6X4 STRL (CAST SUPPLIES) ×3 IMPLANT
PENCIL SMOKE EVACUATOR (MISCELLANEOUS) ×2 IMPLANT
PIN DRILL FIX HALF THREAD (BIT) ×1 IMPLANT
PIN STEINMAN FIXATION KNEE (PIN) ×1 IMPLANT
PROTECTOR NERVE ULNAR (MISCELLANEOUS) ×2 IMPLANT
SET HNDPC FAN SPRY TIP SCT (DISPOSABLE) ×1 IMPLANT
STRIP CLOSURE SKIN 1/2X4 (GAUZE/BANDAGES/DRESSINGS) ×4 IMPLANT
SUT MNCRL AB 4-0 PS2 18 (SUTURE) ×2 IMPLANT
SUT STRATAFIX 0 PDS 27 VIOLET (SUTURE) ×2
SUT VIC AB 2-0 CT1 27 (SUTURE) ×6
SUT VIC AB 2-0 CT1 TAPERPNT 27 (SUTURE) ×3 IMPLANT
SUTURE STRATFX 0 PDS 27 VIOLET (SUTURE) ×1 IMPLANT
SYR BULB IRRIG 60ML STRL (SYRINGE) ×1 IMPLANT
TIBIAL BASE ROT PLAT SZ 3 KNEE (Knees) ×2 IMPLANT
TRAY FOL W/BAG SLVR 16FR STRL (SET/KITS/TRAYS/PACK) IMPLANT
TRAY FOLEY W/BAG SLVR 16FR LF (SET/KITS/TRAYS/PACK) ×2
WATER STERILE IRR 1000ML POUR (IV SOLUTION) ×4 IMPLANT
WRAP KNEE MAXI GEL POST OP (GAUZE/BANDAGES/DRESSINGS) ×2 IMPLANT
YANKAUER SUCT BULB TIP 10FT TU (MISCELLANEOUS) ×1 IMPLANT

## 2020-01-07 NOTE — Evaluation (Signed)
Physical Therapy Evaluation Patient Details Name: Brooke Wilson MRN: [id number] DOB: Aug 19, 1955 Today's Date: 01/07/2020   History of Present Illness  64 Yo female, S?P left  TKA,, H/O RTKA 7/21.  Clinical Impression  The patient reports pain is 6/10, premedicated. Patient ambulated x 50' and performed steps. patient is to Dc home with OPPT.     Follow Up Recommendations Follow surgeon's recommendation for DC plan and follow-up therapies    Equipment Recommendations  None recommended by PT    Recommendations for Other Services       Precautions / Restrictions Precautions Precautions: Knee Required Braces or Orthoses: Knee Immobilizer - Left Knee Immobilizer - Left: On when out of bed or walking      Mobility  Bed Mobility Overal bed mobility: Needs Assistance Bed Mobility: Supine to Sit;Sit to Supine     Supine to sit: Min guard Sit to supine: Min guard   General bed mobility comments: min guard due to stretcher height    Transfers Overall transfer level: Needs assistance Equipment used: Rolling walker (2 wheeled) Transfers: Sit to/from Stand Sit to Stand: Min assist         General transfer comment: from high stretcher  Ambulation/Gait Ambulation/Gait assistance: Herbalist (Feet): 50 Feet Assistive device: Rolling walker (2 wheeled) Gait Pattern/deviations: Step-to pattern;Step-through pattern Gait velocity: decr   General Gait Details: cues for sequence and approaching steps  Stairs            Wheelchair Mobility    Modified Rankin (Stroke Patients Only)       Balance Overall balance assessment: Needs assistance Sitting-balance support: No upper extremity supported;Feet supported Sitting balance-Leahy Scale: Good     Standing balance support: During functional activity;Single extremity supported Standing balance-Leahy Scale: Fair Standing balance comment: for pericare post voiding                              Pertinent Vitals/Pain Pain Assessment: Faces Pain Score: 6  Pain Location: left knee Pain Descriptors / Indicators: Aching;Grimacing;Guarding Pain Intervention(s): Premedicated before session;Repositioned;Ice applied;Monitored during session    Home Living Family/patient expects to be discharged to:: Private residence Living Arrangements: Spouse/significant other;Children;Other relatives Available Help at Discharge: Family Type of Home: House Home Access: Stairs to enter Entrance Stairs-Rails: Left Entrance Stairs-Number of Steps: 5 Home Layout: One level Home Equipment: Walker - 2 wheels      Prior Function Level of Independence: Independent               Hand Dominance        Extremity/Trunk Assessment   Upper Extremity Assessment Upper Extremity Assessment: Overall WFL for tasks assessed    Lower Extremity Assessment LLE Deficits / Details: + SLR with kI.    Cervical / Trunk Assessment Cervical / Trunk Assessment: Normal  Communication   Communication: No difficulties  Cognition Arousal/Alertness: Awake/alert Behavior During Therapy: WFL for tasks assessed/performed Overall Cognitive Status: Within Functional Limits for tasks assessed                                        General Comments      Exercises     Assessment/Plan    PT Assessment All further PT needs can be met in the next venue of care  PT Problem List Decreased strength  PT Treatment Interventions      PT Goals (Current goals can be found in the Care Plan section)  Acute Rehab PT Goals Patient Stated Goal: to go home and see my grandson PT Goal Formulation: All assessment and education complete, DC therapy    Frequency     Barriers to discharge        Co-evaluation               AM-PAC PT "6 Clicks" Mobility  Outcome Measure Help needed turning from your back to your side while in a flat bed without using bedrails?: A Little Help  needed moving from lying on your back to sitting on the side of a flat bed without using bedrails?: A Little Help needed moving to and from a bed to a chair (including a wheelchair)?: A Little Help needed standing up from a chair using your arms (e.g., wheelchair or bedside chair)?: A Little Help needed to walk in hospital room?: A Little Help needed climbing 3-5 steps with a railing? : A Little 6 Click Score: 18    End of Session Equipment Utilized During Treatment: Gait belt;Left knee immobilizer Activity Tolerance: Patient tolerated treatment well Patient left: in bed;with call bell/phone within reach Nurse Communication: Mobility status PT Visit Diagnosis: Difficulty in walking, not elsewhere classified (R26.2);Pain Pain - Right/Left: Left Pain - part of body: Knee    Time: 1345-1415 PT Time Calculation (min) (ACUTE ONLY): 30 min   Charges:   PT Evaluation $PT Eval Low Complexity: 1 Low PT Treatments $Gait Training: 8-22 mins        Tresa Endo PT Acute Rehabilitation Services Pager [phone] Office [phone]   [name] 01/07/2020, 2:33 PM

## 2020-01-07 NOTE — Anesthesia Preprocedure Evaluation (Signed)
Anesthesia Evaluation  Patient identified by MRN, date of birth, ID band Patient awake    Reviewed: Allergy & Precautions, NPO status , Patient's Chart, lab work & pertinent test results  Airway Mallampati: II  TM Distance: <3 FB Neck ROM: Full    Dental  (+) Edentulous Upper, Dental Advisory Given   Pulmonary asthma ,    breath sounds clear to auscultation       Cardiovascular hypertension, Pt. on home beta blockers and Pt. on medications  Rhythm:Regular Rate:Normal     Neuro/Psych Anxiety  Neuromuscular disease    GI/Hepatic Neg liver ROS, GERD  Medicated,  Endo/Other  negative endocrine ROS  Renal/GU      Musculoskeletal  (+) Arthritis ,   Abdominal Normal abdominal exam  (+)   Peds  Hematology negative hematology ROS (+)   Anesthesia Other Findings   Reproductive/Obstetrics                             Anesthesia Physical Anesthesia Plan  ASA: II  Anesthesia Plan: Spinal   Post-op Pain Management:  Regional for Post-op pain   Induction: Intravenous  PONV Risk Score and Plan: 3 and Ondansetron, Dexamethasone and Midazolam  Airway Management Planned: Natural Airway and Simple Face Mask  Additional Equipment: None  Intra-op Plan:   Post-operative Plan:   Informed Consent:   Plan Discussed with: CRNA  Anesthesia Plan Comments:         Anesthesia Quick Evaluation

## 2020-01-07 NOTE — Discharge Instructions (Signed)
Brooke Gross, MD Total Joint Specialist [facility] [place] [address], [address] [place], [place] [address] [phone]    TOTAL KNEE REPLACEMENT POSTOPERATIVE DIRECTIONS    Knee Rehabilitation, Guidelines Following Surgery  Results after knee surgery are often greatly improved when you follow the exercise, range of motion and muscle strengthening exercises prescribed by your doctor. Safety measures are also important to protect the knee from further injury. If any of these exercises cause you to have increased pain or swelling in your knee joint, decrease the amount until you are comfortable again and slowly increase them. If you have problems or questions, call your caregiver or physical therapist for advice.   BLOOD CLOT PREVENTION . Take a 325 mg Aspirin two times a day for three weeks following surgery. Then resume one 81 mg aspirin once a day. Brooke Wilson may resume your vitamins/supplements upon discharge from the hospital. . Do not take any NSAIDs (Advil, Aleve, Ibuprofen, Meloxicam, etc.) until you have discontinued the 325 mg Aspirin.  HOME CARE INSTRUCTIONS  . Remove items at home which could result in a fall. This includes throw rugs or furniture in walking pathways.   ICE to the affected knee as much as tolerated. Icing helps control swelling. If the swelling is well controlled you will be more comfortable and rehab easier. Continue to use ice on the knee for pain and swelling from surgery. You may notice swelling that will progress down to the foot and ankle. This is normal after surgery. Elevate the leg when you are not up walking on it.    Continue to use the breathing machine which will help keep your temperature down.  It is common for your temperature to cycle up and down following surgery, especially at night when you are not up moving around and exerting yourself.  The breathing machine keeps your lungs expanded and your temperature down.  Do not place  pillow under knee, focus on keeping the knee straight while resting  PAIN CONTROL Achieving adequate pain control can be challenging in the first 48-72 hours after the nerve block wears off. During this time it is best to stay ahead of the pain by taking your prescribed pain meds every 4-6 hours. Once the pain is well controlled (you will not be pain free but the goal is having it under control) you may begin slowly weaning off the medications  DIET You may resume your previous home diet once you are discharged from the hospital.  DRESSING / WOUND CARE / SHOWERING . Keep your bulky bandage on for 2 days. On the third post-operative day you may remove the Ace bandage and gauze. There is a waterproof adhesive bandage on your skin which will stay in place until your first follow-up appointment. Once you remove this you will not need to place another bandage . You may begin showering 3 days following surgery, but do not submerge the incision under water.  ACTIVITY For the first 5 days the key is rest and control of pain and swelling . You should rest, ice and elevate the leg for 50 minutes out of every hour. Get up and walk/stretch for 10 minutes per hour. After 5 days you can increase your activity slowly as tolerated . Walk with your walker as instructed. Use the walker until you are comfortable transitioning to a cane. Walk with the cane in the opposite hand of the operative leg. You may discontinue the cane once you are comfortable. . You may  discontinue the knee immobilizer once you are able to perform a straight leg raise while lying down . Avoid periods of inactivity such as sitting longer than an hour when not asleep. This helps prevent blood clots.  . Do your home exercises twice a day starting on post-operative day 3. On the days you go to physical therapy, just do the home exercises once that day. . Do not drive a car until released by your surgeon.  . Do not drive while taking  narcotics.  TED HOSE STOCKINGS Wear the elastic stockings on both legs for three weeks following surgery during the day. You may remove them at night for sleeping.  WEIGHT BEARING You may bear weight as tolerated on the operative leg.  POSTOPERATIVE CONSTIPATION PROTOCOL Constipation - defined medically as fewer than three stools per week and severe constipation as less than one stool per week.  One of the most common issues patients have following surgery is constipation.  Even if you have a regular bowel pattern at home, your normal regimen is likely to be disrupted due to multiple reasons following surgery.  Combination of anesthesia, postoperative narcotics, change in appetite and fluid intake all can affect your bowels.  In order to avoid complications following surgery, here are some recommendations in order to help you during your recovery period.  Colace (docusate) - Pick up an over-the-counter form of Colace or another stool softener and take twice a day as long as you are requiring postoperative pain medications.  Take with a full glass of water daily.  If you experience loose stools or diarrhea, hold the colace until you stool forms back up.  If your symptoms do not get better within 1 week or if they get worse, check with your doctor.  MiraLax (polyethylene glycol) - Pick up over-the-counter to have on hand.  MiraLax is a solution that will increase the amount of water in your bowels to assist with bowel movements.  Take as directed and can mix with a glass of water, juice, soda, coffee, or tea.  Take if you go more than two days without a movement. Do not use MiraLax more than once per day. Call your doctor if you are still constipated or irregular after using this medication for 7 days in a row.  If you continue to have problems with postoperative constipation, please contact the office for further assistance and recommendations.  If you experience "the worst abdominal pain ever" or  develop nausea or vomiting, please contact the office immediatly for further recommendations for treatment.  ITCHING  If you experience itching with your medications, try taking only a single pain pill, or even half a pain pill at a time.  You can also use Benadryl over the counter for itching or also to help with sleep.   MEDICATIONS See your medication summary on the "After Visit Summary" that the nursing staff will review with you prior to discharge.  You may have some home medications which will be placed on hold until you complete the course of blood thinner medication.  It is important for you to complete the blood thinner medication as prescribed by your surgeon.  Continue your approved medications as instructed at time of discharge.  PRECAUTIONS If you experience chest pain or shortness of breath - call 911 immediately for transfer to the hospital emergency department.  If you develop a fever greater that 101 F, purulent drainage from wound, increased redness or drainage from wound, foul odor from the wound/dressing,  or calf pain - CONTACT YOUR SURGEON.                                                   FOLLOW-UP APPOINTMENTS Make sure you keep all of your appointments after your operation with your surgeon and caregivers. You should call the office at the above phone number and make an appointment for approximately two weeks after the date of your surgery or on the date instructed by your surgeon outlined in the "After Visit Summary".  MAKE SURE YOU:  . Understand these instructions.  . Get help right away if you are not doing well or get worse.   DENTAL ANTIBIOTICS:  In most cases prophylactic antibiotics for Dental procdeures after total joint surgery are not necessary.  Exceptions are as follows:  1. History of prior total joint infection  2. Severely immunocompromised (Organ Transplant, cancer chemotherapy, Rheumatoid biologic meds such as Humera)  3. Poorly controlled  diabetes (A1C &gt; 8.0, blood glucose over 200)  If you have one of these conditions, contact your surgeon for an antibiotic prescription, prior to your dental procedure.    Pick up stool softner and laxative for home use following surgery while on pain medications. May shower starting three days after surgery. Please use a clean towel to pat the incision dry following showers. Continue to use ice for pain and swelling after surgery. Do not use any lotions or creams on the incision until instructed by your surgeon.

## 2020-01-07 NOTE — Interval H&P Note (Signed)
History and Physical Interval Note:  01/07/2020 6:48 AM  Brooke Wilson  has presented today for surgery, with the diagnosis of left knee osteoarthritis.  The various methods of treatment have been discussed with the patient and family. After consideration of risks, benefits and other options for treatment, the patient has consented to  Procedure(s) with comments: TOTAL KNEE ARTHROPLASTY (Left) - as a surgical intervention.  The patient's history has been reviewed, patient examined, no change in status, stable for surgery.  I have reviewed the patient's chart and labs.  Questions were answered to the patient's satisfaction.     Homero Fellers Mahkayla Preece

## 2020-01-07 NOTE — Op Note (Signed)
OPERATIVE REPORT-TOTAL KNEE ARTHROPLASTY   Pre-operative diagnosis- Osteoarthritis  Left knee(s)  Post-operative diagnosis- Osteoarthritis Left knee(s)  Procedure-  Left  Total Knee Arthroplasty  Surgeon- Brooke Rankin. Dominica Kent, MD  Assistant- Leilani Able, PA-C   Anesthesia-  Adductor canal block and spinal  EBL- 25 ml   Drains None  Tourniquet time- 29 minutes @ 300 mm Hg  Complications- None  Condition-PACU - hemodynamically stable.   Brief Clinical Note  Brooke Wilson is a 64 y.o. year old female with end stage OA of her left knee with progressively worsening pain and dysfunction. She has constant pain, with activity and at rest and significant functional deficits with difficulties even with ADLs. She has had extensive non-op management including analgesics, injections of cortisone and viscosupplements, and home exercise program, but remains in significant pain with significant dysfunction. Radiographs show bone on bone arthritis medial and patellofemoral. She presents now for left Total Knee Arthroplasty.    Procedure in detail---   The patient is brought into the operating room and positioned supine on the operating table. After successful administration of  Adductor canal block and spinal,   a tourniquet is placed high on the  Left thigh(s) and the lower extremity is prepped and draped in the usual sterile fashion. Time out is performed by the operating team and then the  Left lower extremity is wrapped in Esmarch, knee flexed and the tourniquet inflated to 300 mmHg.       A midline incision is made with a ten blade through the subcutaneous tissue to the level of the extensor mechanism. A fresh blade is used to make a medial parapatellar arthrotomy. Soft tissue over the proximal medial tibia is subperiosteally elevated to the joint line with a knife and into the semimembranosus bursa with a Cobb elevator. Soft tissue over the proximal lateral tibia is elevated with attention  being paid to avoiding the patellar tendon on the tibial tubercle. The patella is everted, knee flexed 90 degrees and the ACL and PCL are removed. Findings are bone on bone medial and patellofemoral with large global osteophytes.        The drill is used to create a starting hole in the distal femur and the canal is thoroughly irrigated with sterile saline to remove the fatty contents. The 5 degree Left  valgus alignment guide is placed into the femoral canal and the distal femoral cutting block is pinned to remove 9 mm off the distal femur. Resection is made with an oscillating saw.      The tibia is subluxed forward and the menisci are removed. The extramedullary alignment guide is placed referencing proximally at the medial aspect of the tibial tubercle and distally along the second metatarsal axis and tibial crest. The block is pinned to remove 35mm off the more deficient medial  side. Resection is made with an oscillating saw. Size 3is the most appropriate size for the tibia and the proximal tibia is prepared with the modular drill and keel punch for that size.      The femoral sizing guide is placed and size 4 is most appropriate. Rotation is marked off the epicondylar axis and confirmed by creating a rectangular flexion gap at 90 degrees. The size 4 cutting block is pinned in this rotation and the anterior, posterior and chamfer cuts are made with the oscillating saw. The intercondylar block is then placed and that cut is made.      Trial size 3 tibial component, trial size 4  posterior stabilized femur and a 10  mm posterior stabilized rotating platform insert trial is placed. Full extension is achieved with excellent varus/valgus and anterior/posterior balance throughout full range of motion. The patella is everted and thickness measured to be 21  mm. Free hand resection is taken to 12 mm, a 32 template is placed, lug holes are drilled, trial patella is placed, and it tracks normally. Osteophytes are  removed off the posterior femur with the trial in place. All trials are removed and the cut bone surfaces prepared with pulsatile lavage. Cement is mixed and once ready for implantation, the size 3 tibial implant, size  4 posterior stabilized femoral component, and the size 32 patella are cemented in place and the patella is held with the clamp. The trial insert is placed and the knee held in full extension. The Exparel (20 ml mixed with 60 ml saline) is injected into the extensor mechanism, posterior capsule, medial and lateral gutters and subcutaneous tissues.  All extruded cement is removed and once the cement is hard the permanent 10 mm posterior stabilized rotating platform insert is placed into the tibial tray.      The wound is copiously irrigated with saline solution and the extensor mechanism closed with # 0 Stratofix suture. The tourniquet is released for a total tourniquet time of 29  minutes. Flexion against gravity is 140 degrees and the patella tracks normally. Subcutaneous tissue is closed with 2.0 vicryl and subcuticular with running 4.0 Monocryl. The incision is cleaned and dried and steri-strips and a bulky sterile dressing are applied. The limb is placed into a knee immobilizer and the patient is awakened and transported to recovery in stable condition.      Please note that a surgical assistant was a medical necessity for this procedure in order to perform it in a safe and expeditious manner. Surgical assistant was necessary to retract the ligaments and vital neurovascular structures to prevent injury to them and also necessary for proper positioning of the limb to allow for anatomic placement of the prosthesis.   Brooke Rankin Sonnet Rizor, MD    01/07/2020, 10:29 AM

## 2020-01-07 NOTE — H&P (Signed)
TOTAL KNEE ADMISSION H&P  Patient is being admitted for left total knee arthroplasty.  Subjective:  Chief Complaint:left knee pain.  HPI: Brooke Wilson, 64 y.o. female, has a history of pain and functional disability in the left knee due to arthritis and has failed non-surgical conservative treatments for greater than 12 weeks to includecorticosteriod injections and activity modification.  Onset of symptoms was gradual, starting 2 years ago with gradually worsening course since that time. The patient noted no past surgery on the left knee(s).  Patient currently rates pain in the left knee(s) at 8 out of 10 with activity. Patient has worsening of pain with activity and weight bearing and pain with passive range of motion.  Patient has evidence of joint space narrowing by imaging studies.There is no active infection.  Patient Active Problem List   Diagnosis Date Noted  . Primary osteoarthritis of right knee 04/19/2019  . Perforated sigmoid colon (HCC) 12/01/2018  . Sepsis (HCC) 12/01/2018  . Asthma 12/01/2018  . Anxiety 12/01/2018  . CKD (chronic kidney disease), 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  . Prediabetes 04/21/2018  . H/O hypokalemia 04/21/2018  . Therapeutic drug monitoring 09/21/2017  . Class 1 obesity due to excess calories with serious comorbidity and body mass index (BMI) of 31.0 to 31.9 in adult 09/30/2016  . Mid back pain 02/17/2012  . Chronic neck pain 02/17/2012  . AR (allergic rhinitis) 02/17/2012  . Gastroesophageal reflux disease 05/17/2011  . Anxiety state 05/17/2011  . Insomnia 05/17/2011  . Essential hypertension 03/23/2011  . Hyperlipemia 03/23/2011   Past Medical History:  Diagnosis Date  . Acute maxillary sinusitis 11/12/2015  . Allergy   . Anxiety state 05/17/2011  . AR (allergic rhinitis) 02/17/2012  . Asthma   . BMI 31.0-31.9,adult 03/23/2011  . Cough 03/23/2011  . Dental disease 02/17/2012   Uppers replaced in [place] 01/2012   . Essential hypertension, benign  03/23/2011  . GERD (gastroesophageal reflux disease) 05/17/2011  . H/O varicella   . History of measles, mumps, or rubella   . Hyperlipemia 03/23/2011  . Hyperlipidemia   . Hypertension   . Hypertensive disorder 09/30/2016  . Insomnia 05/17/2011  . Mid back pain 02/17/2012  . Neck pain 02/17/2012  . Obesity 09/30/2016  . 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 RAD (reactive airway disease) 02/17/2012    Past Surgical History:  Procedure Laterality Date  . REPLACEMENT TOTAL KNEE Right   . TUBAL LIGATION    . VAGINAL HYSTERECTOMY     uterine prolapse; DUB; ovaries intact.  [place]    Current Facility-Administered Medications  Medication Dose Route Frequency Provider Last Rate Last Admin  . bupivacaine liposome (EXPAREL) 1.3 % injection 266 mg  20 mL Other Once Ollen Gross, MD       Current Outpatient Medications  Medication Sig Dispense Refill Last Dose  . albuterol (PROVENTIL HFA) 108 (90 Base) MCG/ACT inhaler INHALE 2 PUFFS INTO THE LUNGS EVERY 6 HOURS AS NEEDED FOR WHEEZING OR SHORTNESS OF BREATH (Patient taking differently: Inhale 2 puffs into the lungs every 6 (six) hours as needed for wheezing or shortness of breath. ) 6.7 each 5   . aMILoride (MIDAMOR) 5 MG tablet Take 1 tablet (5 mg total) by mouth daily. 90 tablet 1   . ascorbic acid (VITAMIN C) 500 MG tablet Take 500 mg by mouth daily.     Marland Kitchen aspirin 81 MG tablet Take 81 mg by mouth daily.     . baclofen (LIORESAL) 10 MG tablet Take 3 times  daily as needed for muscle relaxant for neck (Patient not taking: Reported on 12/28/2019) 60 each 3   . carvedilol (COREG) 12.5 MG tablet Take 1 tablet (12.5 mg total) by mouth 2 (two) times daily. 180 tablet 1   . cloNIDine (CATAPRES) 0.1 MG tablet Take 1 tablet (0.1 mg total) by mouth at bedtime. 90 tablet 1   . diazepam (VALIUM) 10 MG tablet TAKE 1 TABLET BY MOUTH EVERY DAY AS NEEDED FOR MUSCLE SPASM (Patient taking differently: Take 10 mg by mouth daily as needed (muscle spasm). ) 30 tablet 5   .  diltiazem (CARDIZEM CD) 120 MG 24 hr capsule Take 1 capsule (120 mg total) by mouth every morning. 90 capsule 1   . diltiazem (CARDIZEM CD) 240 MG 24 hr capsule Take 1 capsule (240 mg total) by mouth daily. (Patient taking differently: Take 240 mg by mouth at bedtime. ) 90 capsule 1   . famotidine (PEPCID) 40 MG tablet Take 1 tablet (40 mg total) by mouth 2 (two) times daily. 180 tablet 1   . fluticasone (FLONASE) 50 MCG/ACT nasal spray PLACE 2 SPRAYS IN THE NOSTRIL EVERY DAY (Patient taking differently: Place 2 sprays into both nostrils daily as needed for allergies. ) 48 g 3   . hydrALAZINE (APRESOLINE) 25 MG tablet Take 1 tablet (25 mg total) by mouth 3 (three) times daily. 270 tablet 1   . ibuprofen (ADVIL) 800 MG tablet TAKE 1 TABLET BY MOUTH 2 TIMES DAILY AS NEEDED. (Patient taking differently: Take 800 mg by mouth 2 (two) times daily as needed for moderate pain. ) 180 tablet 1   . lactulose (CHRONULAC) 10 GM/15ML solution Take 15 mLs (10 g total) by mouth 3 (three) times daily. (Patient taking differently: Take 10 g by mouth 3 (three) times daily as needed for moderate constipation. ) 240 mL 1   . montelukast (SINGULAIR) 10 MG tablet Take 1 tablet (10 mg total) by mouth at bedtime. 90 tablet 3   . omeprazole (PRILOSEC) 40 MG capsule Take 1 capsule (40 mg total) by mouth daily. 90 capsule 3   . potassium chloride (KLOR-CON 10) 10 MEQ tablet TAKE 3 TABLETS BY MOUTH EVERY DAY (Patient taking differently: Take 30 mEq by mouth daily. ) 270 tablet 1   . rosuvastatin (CRESTOR) 40 MG tablet Take 40 mg by mouth daily.     . traMADol (ULTRAM) 50 MG tablet TAKE 1 TABLET BY MOUTH EVERY 12 HOURS AS NEEDED FOR MODERATE PAIN 60 tablet 5   . zinc sulfate 220 MG capsule Take 1 capsule (220 mg total) by mouth daily. 90 capsule 3   . amLODipine (NORVASC) 10 MG tablet Take 1 tablet (10 mg total) by mouth daily. (Patient not taking: Reported on 12/21/2019) 90 tablet 1 Not Taking at Unknown time  .  amoxicillin-clavulanate (AUGMENTIN) 875-125 MG tablet Take 1 tablet by mouth 2 (two) times daily. (Patient not taking: Reported on 12/21/2019) 20 tablet 0 Not Taking at Unknown time  . latanoprost (XALATAN) 0.005 % ophthalmic solution PLACE 1 DROP INTO BOTH EYES TWICE A DAY (Patient not taking: Reported on 12/21/2019) 15 mL 2 Not Taking at Unknown time  . rosuvastatin (CRESTOR) 20 MG tablet 1 tablet daily (Patient not taking: Reported on 12/21/2019) 90 tablet 3 Not Taking at Unknown time   Allergies  Allergen Reactions  . Ace Inhibitors Cough  . Iodinated Diagnostic Agents Hives    Chills  . Lisinopril Cough  . Sulfa Antibiotics Hives  . Red Dye   .  Tape     Surgical tape causes itching  . Yellow Dye   . Bactrim Rash  . Flexeril [Cyclobenzaprine Hcl] Nausea Only  . Latex Itching  . Naprosyn [Naproxen] Nausea And Vomiting    And high doses of NSAIDS    Social History   Tobacco Use  . Smoking status: Never Smoker  . Smokeless tobacco: Never Used  Substance Use Topics  . Alcohol use: No    Alcohol/week: 0.0 standard drinks    Family History  Problem Relation Age of Onset  . Hypertension Mother   . Stroke Mother   . Hypertension Brother   . Stroke Brother   . Cancer Maternal Grandmother        ovarian  . Hypertension Sister   . Stroke Sister [id number]       CVA     Review of Systems  Constitutional: Negative for chills and fever.  Respiratory: Negative for cough and shortness of breath.   Cardiovascular: Negative for chest pain.  Gastrointestinal: Negative for nausea and vomiting.  Musculoskeletal: Positive for arthralgias.    Objective:  Physical Exam Patient is a 64 year old female.  Well nourished and well developed. General: Alert and oriented x3, cooperative and pleasant, no acute distress. Head: normocephalic, atraumatic, neck supple. Eyes: EOMI. Respiratory: breath sounds clear in all fields, no wheezing, rales, or rhonchi. Cardiovascular: Regular rate and  rhythm, no murmurs, gallops or rubs. Musculoskeletal:  Left Knee Exam: No intra-articular effusion present. No swelling present. No Baker's cyst present. The range of motion is: 0 to 135 degrees. Moderate crepitus on range of motion of the knee. Positive medial greater than lateral joint line tenderness. The knee is stable.  Calves soft and nontender. Motor function intact in LE. Strength 5/5 LE bilaterally. Neuro: Distal pulses 2+. Sensation to light touch intact in LE.  Vital signs in last 24 hours:    Labs:   Estimated body mass index is 29.65 kg/m as calculated from the following:   Height as of 12/28/19: 4\' 11"  (1.499 m).   Weight as of 12/28/19: 66.6 kg.   Imaging Review Plain radiographs demonstrate severe degenerative joint disease of the left knee(s). The overall alignment isneutral. The bone quality appears to be adequate for age and reported activity level.      Assessment/Plan:  End stage arthritis, left knee   The patient history, physical examination, clinical judgment of the provider and imaging studies are consistent with end stage degenerative joint disease of the left knee(s) and total knee arthroplasty is deemed medically necessary. The treatment options including medical management, injection therapy arthroscopy and arthroplasty were discussed at length. The risks and benefits of total knee arthroplasty were presented and reviewed. The risks due to aseptic loosening, infection, stiffness, patella tracking problems, thromboembolic complications and other imponderables were discussed. The patient acknowledged the explanation, agreed to proceed with the plan and consent was signed. Patient is being admitted for inpatient treatment for surgery, pain control, PT, OT, prophylactic antibiotics, VTE prophylaxis, progressive ambulation and ADL's and discharge planning. The patient is planning to be discharged home.   Therapy Plans: outpatient therapy at [facility] Disposition: Home with husband Planned DVT Prophylaxis: aspirin 325mg  BID DME needed: none PCP: Dr. Trina AoSantiago/Green Cardiologist: Dr. Dulce SellarMunley TXA: IV Allergies: Latex (rash), adhesive tape (itching), Bactrim - hives , Sulfa drugs - hives Anesthesia Concerns: none BMI: 30.5 Not diabetic.  Other: Plan for SDD. Did well with Oxycodone and Tramadol last time. Will have foley catheter placed due to  urinary retention after last spinal & TKA.  Patient's anticipated LOS is less than 2 midnights, meeting these requirements: - Younger than 37 - Lives within 1 hour of care - Has a competent adult at home to recover with post-op recover - NO history of  - Chronic pain requiring opiods  - Diabetes  - Coronary Artery Disease  - Heart failure  - Heart attack  - Stroke  - DVT/VTE  - Cardiac arrhythmia  - Respiratory Failure/COPD  - Renal failure  - Anemia  - Advanced Liver disease  - Patient was instructed on what medications to stop prior to surgery. - Follow-up visit in 2 weeks with Dr. Lequita Halt - Begin physical therapy following surgery - Pre-operative lab work as pre-surgical testing - Prescriptions will be provided in hospital at time of discharge  Dennie Bible, PA-C Orthopedic Surgery [facility] [place] [phone]

## 2020-01-07 NOTE — Transfer of Care (Signed)
Immediate Anesthesia Transfer of Care Note  Patient: Davon A Casher  Procedure(s) Performed: TOTAL KNEE ARTHROPLASTY (Left Knee)  Patient Location: PACU  Anesthesia Type:Spinal  Level of Consciousness: awake, alert , oriented and patient cooperative  Airway & Oxygen Therapy: Patient Spontanous Breathing and Patient connected to face mask oxygen  Post-op Assessment: Report given to RN and Post -op Vital signs reviewed and stable  Post vital signs: Reviewed and stable  Last Vitals:  Vitals Value Taken Time  BP 119/64 1052  Temp    Pulse 65 01/07/20 1050  Resp 15 01/07/20 1050  SpO2 100 % 01/07/20 1050  Vitals shown include unvalidated device data.  Last Pain:  Vitals:   01/07/20 0655  TempSrc: Oral  PainSc:       Patients Stated Pain Goal: 4 (01/07/20 0654)  Complications: No complications documented.

## 2020-01-07 NOTE — Anesthesia Procedure Notes (Signed)
Spinal  Patient location during procedure: OR Start time: 01/07/2020 9:30 AM End time: 01/07/2020 9:33 AM Staffing Performed: resident/CRNA  Anesthesiologist: Shelton Silvas, MD Resident/CRNA: Yolonda Kida, CRNA Preanesthetic Checklist Completed: patient identified, IV checked, site marked, risks and benefits discussed, surgical consent, monitors and equipment checked, pre-op evaluation and timeout performed Spinal Block Patient position: sitting Prep: DuraPrep Patient monitoring: heart rate, continuous pulse ox and blood pressure Approach: midline Location: L3-4 Injection technique: single-shot Needle Needle type: Pencan  Needle gauge: 24 G Assessment Sensory level: T6

## 2020-01-07 NOTE — Anesthesia Postprocedure Evaluation (Signed)
Anesthesia Post Note  Patient: Brooke Wilson  Procedure(s) Performed: TOTAL KNEE ARTHROPLASTY (Left Knee)     Patient location during evaluation: PACU Anesthesia Type: Spinal Level of consciousness: oriented and awake and alert Pain management: pain level controlled Vital Signs Assessment: post-procedure vital signs reviewed and stable Respiratory status: spontaneous breathing, respiratory function stable and patient connected to nasal cannula oxygen Cardiovascular status: blood pressure returned to baseline and stable Postop Assessment: no headache, no backache and no apparent nausea or vomiting Anesthetic complications: no   No complications documented.  Last Vitals:  Vitals:   01/07/20 1115 01/07/20 1133  BP: (!) 124/55 (!) 130/52  Pulse: 61 62  Resp: 16 18  Temp:  36.4 C  SpO2: 100% 98%                  Shelton Silvas

## 2020-01-07 NOTE — Progress Notes (Signed)
Assisted Dr. Hollis with left, ultrasound guided, adductor canal block. Side rails up, monitors on throughout procedure. See vital signs in flow sheet. Tolerated Procedure well.  

## 2020-01-07 NOTE — Progress Notes (Signed)
Orthopedic Tech Progress Note Patient Details:  Brooke Wilson 1955-06-16 [id number]  CPM Left Knee CPM Left Knee: On Left Knee Flexion (Degrees): 40 Left Knee Extension (Degrees): 10  Post Interventions Patient Tolerated: Well Instructions Provided: Care of device  Saul Fordyce 01/07/2020, 10:51 AM

## 2020-01-08 ENCOUNTER — Encounter (HOSPITAL_COMMUNITY): Payer: Self-pay | Admitting: Orthopedic Surgery

## 2020-01-13 DIAGNOSIS — M25562 Pain in left knee: Secondary | ICD-10-CM | POA: Insufficient documentation

## 2020-01-13 HISTORY — DX: Pain in left knee: M25.562

## 2020-01-14 DIAGNOSIS — M25562 Pain in left knee: Secondary | ICD-10-CM | POA: Diagnosis not present

## 2020-01-18 DIAGNOSIS — M25562 Pain in left knee: Secondary | ICD-10-CM | POA: Diagnosis not present

## 2020-01-21 DIAGNOSIS — H25813 Combined forms of age-related cataract, bilateral: Secondary | ICD-10-CM | POA: Diagnosis not present

## 2020-01-21 DIAGNOSIS — Z96652 Presence of left artificial knee joint: Secondary | ICD-10-CM | POA: Insufficient documentation

## 2020-01-21 DIAGNOSIS — H35372 Puckering of macula, left eye: Secondary | ICD-10-CM | POA: Diagnosis not present

## 2020-01-21 HISTORY — DX: Presence of left artificial knee joint: Z96.652

## 2020-01-22 DIAGNOSIS — M25562 Pain in left knee: Secondary | ICD-10-CM | POA: Diagnosis not present

## 2020-01-25 DIAGNOSIS — M25562 Pain in left knee: Secondary | ICD-10-CM | POA: Diagnosis not present

## 2020-01-29 DIAGNOSIS — M25562 Pain in left knee: Secondary | ICD-10-CM | POA: Diagnosis not present

## 2020-02-05 DIAGNOSIS — M25562 Pain in left knee: Secondary | ICD-10-CM | POA: Diagnosis not present

## 2020-02-07 DIAGNOSIS — M25562 Pain in left knee: Secondary | ICD-10-CM | POA: Diagnosis not present

## 2020-02-12 DIAGNOSIS — M25562 Pain in left knee: Secondary | ICD-10-CM | POA: Diagnosis not present

## 2020-02-13 DIAGNOSIS — Z96652 Presence of left artificial knee joint: Secondary | ICD-10-CM | POA: Diagnosis not present

## 2020-02-14 ENCOUNTER — Other Ambulatory Visit: Payer: Self-pay | Admitting: Cardiology

## 2020-02-14 DIAGNOSIS — M25562 Pain in left knee: Secondary | ICD-10-CM | POA: Diagnosis not present

## 2020-02-22 ENCOUNTER — Telehealth: Payer: Self-pay | Admitting: Cardiology

## 2020-02-22 DIAGNOSIS — E782 Mixed hyperlipidemia: Secondary | ICD-10-CM

## 2020-02-22 DIAGNOSIS — I10 Essential (primary) hypertension: Secondary | ICD-10-CM

## 2020-02-22 NOTE — Telephone Encounter (Signed)
Patient is requesting orders for lab work. Please assist.

## 2020-02-22 NOTE — Telephone Encounter (Signed)
Left message on patients voicemail to please return our call.   

## 2020-02-22 NOTE — Telephone Encounter (Signed)
CMP and lipid profile 

## 2020-02-25 NOTE — Telephone Encounter (Signed)
Tried calling patient. No answer and no voicemail set up for me to leave a message. 

## 2020-03-06 ENCOUNTER — Other Ambulatory Visit: Payer: Self-pay

## 2020-03-06 ENCOUNTER — Other Ambulatory Visit: Payer: Self-pay | Admitting: Emergency Medicine

## 2020-03-06 DIAGNOSIS — I10 Essential (primary) hypertension: Secondary | ICD-10-CM

## 2020-03-06 DIAGNOSIS — R7303 Prediabetes: Secondary | ICD-10-CM

## 2020-03-06 DIAGNOSIS — E782 Mixed hyperlipidemia: Secondary | ICD-10-CM | POA: Diagnosis not present

## 2020-03-06 NOTE — Progress Notes (Signed)
Cbc

## 2020-03-07 ENCOUNTER — Telehealth: Payer: Self-pay | Admitting: Cardiology

## 2020-03-07 LAB — COMPREHENSIVE METABOLIC PANEL
ALT: 13 IU/L (ref 0–32)
AST: 12 IU/L (ref 0–40)
Albumin/Globulin Ratio: 1.7 (ref 1.2–2.2)
Albumin: 4 g/dL (ref 3.8–4.8)
Alkaline Phosphatase: 87 IU/L (ref 44–121)
BUN/Creatinine Ratio: 28 (ref 12–28)
BUN: 19 mg/dL (ref 8–27)
Bilirubin Total: <0.2 mg/dL (ref 0.0–1.2)
CO2: 19 mmol/L — ABNORMAL LOW (ref 20–29)
Calcium: 9.3 mg/dL (ref 8.7–10.3)
Chloride: 108 mmol/L — ABNORMAL HIGH (ref 96–106)
Creatinine, Ser: 0.68 mg/dL (ref 0.57–1.00)
GFR calc Af Amer: 107 mL/min/{1.73_m2} (ref >59)
GFR calc non Af Amer: 93 mL/min/{1.73_m2} (ref >59)
Globulin, Total: 2.4 g/dL (ref 1.5–4.5)
Glucose: 94 mg/dL (ref 65–99)
Potassium: 4.2 mmol/L (ref 3.5–5.2)
Sodium: 140 mmol/L (ref 134–144)
Total Protein: 6.4 g/dL (ref 6.0–8.5)

## 2020-03-07 LAB — CBC
Hematocrit: 37.5 % (ref 34.0–46.6)
Hemoglobin: 11.6 g/dL (ref 11.1–15.9)
MCH: 25.9 pg — ABNORMAL LOW (ref 26.6–33.0)
MCHC: 30.9 g/dL — ABNORMAL LOW (ref 31.5–35.7)
MCV: 84 fL (ref 79–97)
Platelets: 242 10*3/uL (ref 150–450)
RBC: 4.48 x10E6/uL (ref 3.77–5.28)
RDW: 14.6 % (ref 11.7–15.4)
WBC: 8.9 10*3/uL (ref 3.4–10.8)

## 2020-03-07 LAB — LIPID PANEL
Chol/HDL Ratio: 2.1 ratio (ref 0.0–4.4)
Cholesterol, Total: 173 mg/dL (ref 100–199)
HDL: 81 mg/dL (ref >39)
LDL Chol Calc (NIH): 75 mg/dL (ref 0–99)
Triglycerides: 95 mg/dL (ref 0–149)
VLDL Cholesterol Cal: 17 mg/dL (ref 5–40)

## 2020-03-07 LAB — HEMOGLOBIN A1C
Est. average glucose Bld gHb Est-mCnc: 108 mg/dL
Hgb A1c MFr Bld: 5.4 % (ref 4.8–5.6)

## 2020-03-07 NOTE — Telephone Encounter (Signed)
Spoke with patient, she is requesting if Dr. Dulce Sellar would order labs for CBC and A1C. Message sent to Dr. Dulce Sellar for further instructions.

## 2020-03-07 NOTE — Telephone Encounter (Signed)
    Pt is returning call to get lab results 

## 2020-03-09 ENCOUNTER — Encounter: Payer: Self-pay | Admitting: Registered Nurse

## 2020-03-09 NOTE — Progress Notes (Signed)
Established Patient Office Visit  Subjective:  Patient ID: Brooke Wilson, female    DOB: 12/21/1955  Age: 65 y.o. MRN: [id number]  CC:  Chief Complaint  Patient presents with  . Medical Clearance    PAtient states she is here for an surgical clearance for her left knee replacement 01/07/2020.     HPI Brooke Wilson presents for surgical clearance  Has upcoming L TKA on 01/07/20 Has paperwork with her.  No conditions that would preclude this surgery Not taking thinners   Past Medical History:  Diagnosis Date  . Acute maxillary sinusitis 11/12/2015  . Allergy   . Anxiety state 05/17/2011  . AR (allergic rhinitis) 02/17/2012  . Asthma   . BMI 31.0-31.9,adult 03/23/2011  . Cough 03/23/2011  . Dental disease 02/17/2012   [place] in [place] 01/2012   . Essential hypertension, benign 03/23/2011  . GERD (gastroesophageal reflux disease) 05/17/2011  . H/O varicella   . History of measles, mumps, or rubella   . Hyperlipemia 03/23/2011  . Hyperlipidemia   . Hypertension   . Hypertensive disorder 09/30/2016  . Insomnia 05/17/2011  . Mid back pain 02/17/2012  . Neck pain 02/17/2012  . Obesity 09/30/2016  . 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 RAD (reactive airway disease) 02/17/2012    Past Surgical History:  Procedure Laterality Date  . REPLACEMENT TOTAL KNEE Right   . TOTAL KNEE ARTHROPLASTY Left 01/07/2020   Procedure: TOTAL KNEE ARTHROPLASTY;  Surgeon: Ollen Gross, MD;  Location: [facility];  Service: Orthopedics;  Laterality: Left;   . TUBAL LIGATION    . VAGINAL HYSTERECTOMY     uterine prolapse; DUB; ovaries intact.  [place]    Family History  Problem Relation Age of Onset  . Hypertension Mother   . Stroke Mother   . Hypertension Brother   . Stroke Brother   . Cancer Maternal Grandmother        ovarian  . Hypertension Sister   . Stroke Sister 5       CVA    Social History   Socioeconomic History  . Marital status: Married    Spouse name:  Not on file  . Number of children: Not on file  . Years of education: Not on file  . Highest education level: Not on file  Occupational History  . Not on file  Tobacco Use  . Smoking status: Never Smoker  . Smokeless tobacco: Never Used  Vaping Use  . Vaping Use: Never used  Substance and Sexual Activity  . Alcohol use: No    Alcohol/week: 0.0 standard drinks  . Drug use: No  . Sexual activity: Yes    Birth control/protection: Post-menopausal, Surgical    Comment: Hysterectomy  Other Topics Concern  . Not on file  Social History Narrative   Marital status:  Married x 38 years; from the [place]; moved to [place] in 1987.      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      Advanced Directives: DNR/DNI; HCPOA: [place]   Social Determinants of Health   Financial Resource Strain: Not on file  Food Insecurity: Not on file  Transportation Needs: Not on file  Physical Activity: Not on file  Stress: Not on file  Social Connections: Not on file  Intimate Partner Violence: Not on file    Outpatient Medications Prior to Visit  Medication Sig Dispense Refill  . albuterol (PROVENTIL HFA) 108 (90 Base) MCG/ACT inhaler INHALE 2 PUFFS INTO THE LUNGS EVERY 6 HOURS AS NEEDED FOR WHEEZING OR SHORTNESS OF BREATH (Patient taking differently: Inhale 2 puffs into the lungs every 6 (six) hours as needed for wheezing or shortness of breath. ) 6.7 each 5  . aMILoride (MIDAMOR) 5 MG tablet Take 1 tablet (5 mg total) by mouth daily. 90 tablet 1  . ascorbic acid (VITAMIN C) 500 MG tablet Take 500 mg by mouth daily.    . carvedilol (COREG) 12.5 MG tablet Take 1 tablet (12.5 mg total) by mouth 2 (two) times daily. 180 tablet 1  . cloNIDine (CATAPRES) 0.1 MG tablet Take 1 tablet (0.1  mg total) by mouth at bedtime. 90 tablet 1  . diazepam (VALIUM) 10 MG tablet TAKE 1 TABLET BY MOUTH EVERY DAY AS NEEDED FOR MUSCLE SPASM (Patient taking differently: Take 10 mg by mouth daily as needed (muscle spasm). ) 30 tablet 5  . diltiazem (CARDIZEM CD) 120 MG 24 hr capsule Take 1 capsule (120 mg total) by mouth every morning. 90 capsule 1  . diltiazem (CARDIZEM CD) 240 MG 24 hr capsule Take 1 capsule (240 mg total) by mouth daily. (Patient taking differently: Take 240 mg by mouth at bedtime. ) 90 capsule 1  . famotidine (PEPCID) 40 MG tablet Take 1 tablet (40 mg total) by mouth 2 (two) times daily. 180 tablet 1  . fluticasone (FLONASE) 50 MCG/ACT nasal spray PLACE 2 SPRAYS IN THE NOSTRIL EVERY DAY (Patient taking differently: Place 2 sprays into both nostrils daily as needed for allergies. ) 48 g 3  . lactulose (CHRONULAC) 10 GM/15ML solution Take 15 mLs (10 g total) by mouth 3 (three) times daily. (Patient taking differently: Take 10 g by mouth 3 (three) times daily as needed for moderate constipation. ) 240 mL 1  . montelukast (SINGULAIR) 10 MG tablet Take 1 tablet (10 mg total) by mouth at bedtime. 90 tablet 3  . omeprazole (PRILOSEC) 40 MG capsule Take 1 capsule (40 mg total) by mouth daily. 90 capsule 3  . potassium chloride (KLOR-CON 10) 10 MEQ tablet TAKE 3 TABLETS BY MOUTH EVERY DAY (Patient taking differently: Take 30 mEq by mouth daily. ) 270 tablet 1  . rosuvastatin (CRESTOR) 40 MG tablet Take 40 mg by mouth daily.    . traMADol (ULTRAM) 50 MG tablet TAKE 1 TABLET BY MOUTH EVERY 12 HOURS AS NEEDED FOR MODERATE PAIN 60 tablet 5  . zinc sulfate 220 MG capsule Take 1 capsule (220 mg total) by mouth daily. 90 capsule 3  . aspirin 81 MG tablet Take 81 mg by mouth daily.    . hydrALAZINE (APRESOLINE) 25 MG tablet Take 1 tablet (25 mg total) by mouth 3 (three) times daily. 270 tablet 1  . ibuprofen (ADVIL) 800 MG tablet TAKE 1 TABLET BY MOUTH 2 TIMES DAILY AS NEEDED. (Patient taking  differently: Take 800 mg by mouth 2 (two) times daily as needed for moderate pain. ) 180 tablet 1  . baclofen (LIORESAL) 10 MG tablet Take 3 times daily as needed for muscle relaxant for neck (Patient not taking: Reported on 12/28/2019) 60 each 3  . amLODipine (NORVASC) 10 MG tablet Take 1 tablet (10 mg  total) by mouth daily. (Patient not taking: Reported on 12/21/2019) 90 tablet 1  . amoxicillin-clavulanate (AUGMENTIN) 875-125 MG tablet Take 1 tablet by mouth 2 (two) times daily. (Patient not taking: Reported on 12/21/2019) 20 tablet 0  . latanoprost (XALATAN) 0.005 % ophthalmic solution PLACE 1 DROP INTO BOTH EYES TWICE A DAY (Patient not taking: Reported on 12/21/2019) 15 mL 2  . rosuvastatin (CRESTOR) 20 MG tablet 1 tablet daily (Patient not taking: Reported on 12/21/2019) 90 tablet 3   No facility-administered medications prior to visit.    Allergies  Allergen Reactions  . Ace Inhibitors Cough  . Iodinated Diagnostic Agents Hives    Chills  . Lisinopril Cough  . Sulfa Antibiotics Hives  . Red Dye   . Tape     Surgical tape causes itching  . Yellow Dye   . Bactrim Rash  . Flexeril [Cyclobenzaprine Hcl] Nausea Only  . Latex Itching  . Naprosyn [Naproxen] Nausea And Vomiting    And high doses of NSAIDS    ROS Review of Systems  Constitutional: Negative.   HENT: Negative.   Eyes: Negative.   Respiratory: Negative.   Cardiovascular: Negative.   Gastrointestinal: Negative.   Genitourinary: Negative.   Musculoskeletal: Negative.   Skin: Negative.   Neurological: Negative.   Psychiatric/Behavioral: Negative.   All other systems reviewed and are negative.     Objective:    Physical Exam Vitals and nursing note reviewed.  Constitutional:      General: She is not in acute distress.    Appearance: Normal appearance. She is normal weight. She is not ill-appearing, toxic-appearing or diaphoretic.  Cardiovascular:     Rate and Rhythm: Normal rate and regular rhythm.     Heart  sounds: Normal heart sounds. No murmur heard. No friction rub. No gallop.   Pulmonary:     Effort: Pulmonary effort is normal. No respiratory distress.     Breath sounds: Normal breath sounds. No stridor. No wheezing, rhonchi or rales.  Chest:     Chest wall: No tenderness.  Skin:    General: Skin is warm and dry.  Neurological:     General: No focal deficit present.     Mental Status: She is alert and oriented to person, place, and time. Mental status is at baseline.  Psychiatric:        Mood and Affect: Mood normal.        Behavior: Behavior normal.        Thought Content: Thought content normal.        Judgment: Judgment normal.     BP 114/67   Pulse 76   Temp 97.8 F (36.6 C) (Temporal)   Resp 18   Ht 4\' 11"  (1.499 m)   Wt 146 lb 12.8 oz (66.6 kg)   SpO2 96%   BMI 29.65 kg/m  Wt Readings from Last 3 Encounters:  01/07/20 146 lb 12.8 oz (66.6 kg)  12/28/19 146 lb 12.8 oz (66.6 kg)  12/25/19 146 lb 2 oz (66.3 kg)     Health Maintenance Due  Topic Date Due  . PAP SMEAR-Modifier  11/14/2019    There are no preventive care reminders to display for this patient.  Lab Results  Component Value Date   TSH 1.990 04/18/2018   Lab Results  Component Value Date   WBC 8.9 03/06/2020   HGB 11.6 03/06/2020   HCT 37.5 03/06/2020   MCV 84 03/06/2020   PLT 242 03/06/2020   Lab Results  Component  Value Date   NA 140 03/06/2020   K 4.2 03/06/2020   CO2 19 (L) 03/06/2020   GLUCOSE 94 03/06/2020   BUN 19 03/06/2020   CREATININE 0.68 03/06/2020   BILITOT <0.2 03/06/2020   ALKPHOS 87 03/06/2020   AST 12 03/06/2020   ALT 13 03/06/2020   PROT 6.4 03/06/2020   ALBUMIN 4.0 03/06/2020   CALCIUM 9.3 03/06/2020   ANIONGAP 7 12/25/2019   Lab Results  Component Value Date   CHOL 173 03/06/2020   Lab Results  Component Value Date   HDL 81 03/06/2020   Lab Results  Component Value Date   LDLCALC 75 03/06/2020   Lab Results  Component Value Date   TRIG 95  03/06/2020   Lab Results  Component Value Date   CHOLHDL 2.1 03/06/2020   Lab Results  Component Value Date   HGBA1C 5.4 03/06/2020      Assessment & Plan:   Problem List Items Addressed This Visit   None   Visit Diagnoses    Preoperative clearance    -  Primary   Relevant Orders   EKG 12-Lead (Completed)      No orders of the defined types were placed in this encounter.   Follow-up: No follow-ups on file.   PLAN  She has already completed preoperative labs  These are reviewed and found satisfactory  ekg reviewed. No acute changes in comparison with study on 09/06/19  Patient encouraged to call clinic with any questions, comments, or concerns.  Janeece Ageeichard Candance Bohlman, NP

## 2020-03-10 ENCOUNTER — Telehealth: Payer: Self-pay

## 2020-03-10 NOTE — Telephone Encounter (Signed)
Spoke with patient regarding results and recommendation.  Patient verbalizes understanding and is agreeable to plan of care. Advised patient to call back with any issues or concerns.  

## 2020-03-10 NOTE — Telephone Encounter (Signed)
Patient returning call.

## 2020-03-10 NOTE — Telephone Encounter (Signed)
I tried calling the patient to inform her of approved requested labs however she does not have her voice mail set up and did not answer.

## 2020-03-10 NOTE — Telephone Encounter (Signed)
Tried calling patient. No answer and no voicemail set up for me to leave a message. 

## 2020-03-10 NOTE — Telephone Encounter (Signed)
-----   Message from Baldo Daub, MD sent at 03/07/2020  8:39 AM EST ----- Good results for CBC and hemoglobin A1c  No changes

## 2020-03-11 NOTE — Telephone Encounter (Signed)
I tried calling the patient  again and still no answer or a way to leave a message(VM not set up)

## 2020-03-12 ENCOUNTER — Encounter: Payer: Federal, State, Local not specified - PPO | Admitting: Registered Nurse

## 2020-03-13 ENCOUNTER — Encounter: Payer: Self-pay | Admitting: Registered Nurse

## 2020-03-14 ENCOUNTER — Encounter: Payer: Self-pay | Admitting: Family Medicine

## 2020-03-14 ENCOUNTER — Other Ambulatory Visit: Payer: Self-pay

## 2020-03-14 ENCOUNTER — Ambulatory Visit (INDEPENDENT_AMBULATORY_CARE_PROVIDER_SITE_OTHER): Payer: Federal, State, Local not specified - PPO | Admitting: Family Medicine

## 2020-03-14 VITALS — BP 105/66 | HR 73 | Temp 97.9°F | Resp 18 | Ht 59.0 in | Wt 144.8 lb

## 2020-03-14 DIAGNOSIS — J4520 Mild intermittent asthma, uncomplicated: Secondary | ICD-10-CM

## 2020-03-14 DIAGNOSIS — K219 Gastro-esophageal reflux disease without esophagitis: Secondary | ICD-10-CM

## 2020-03-14 DIAGNOSIS — G8929 Other chronic pain: Secondary | ICD-10-CM

## 2020-03-14 DIAGNOSIS — M542 Cervicalgia: Secondary | ICD-10-CM

## 2020-03-14 DIAGNOSIS — I10 Essential (primary) hypertension: Secondary | ICD-10-CM

## 2020-03-14 DIAGNOSIS — J029 Acute pharyngitis, unspecified: Secondary | ICD-10-CM

## 2020-03-14 DIAGNOSIS — J3089 Other allergic rhinitis: Secondary | ICD-10-CM

## 2020-03-14 MED ORDER — TRAMADOL HCL 50 MG PO TABS: ORAL_TABLET | ORAL | 1 refills | Status: DC

## 2020-03-14 MED ORDER — ALBUTEROL SULFATE HFA 108 (90 BASE) MCG/ACT IN AERS: INHALATION_SPRAY | RESPIRATORY_TRACT | 5 refills | Status: DC

## 2020-03-14 MED ORDER — MONTELUKAST SODIUM 10 MG PO TABS
10.0000 mg | ORAL_TABLET | Freq: Every day | ORAL | 1 refills | Status: DC
Start: 2020-03-14 — End: 2020-09-29

## 2020-03-14 MED ORDER — LACTULOSE 10 GM/15ML PO SOLN
10.0000 g | Freq: Three times a day (TID) | ORAL | 2 refills | Status: DC
Start: 2020-03-14 — End: 2020-12-25

## 2020-03-14 MED ORDER — FAMOTIDINE 40 MG PO TABS: 40.0000 mg | ORAL_TABLET | Freq: Two times a day (BID) | ORAL | 1 refills | Status: DC

## 2020-03-14 MED ORDER — ROSUVASTATIN CALCIUM 40 MG PO TABS
40.0000 mg | ORAL_TABLET | Freq: Every day | ORAL | 1 refills | Status: DC
Start: 2020-03-14 — End: 2020-05-16

## 2020-03-14 MED ORDER — FLUTICASONE PROPIONATE 50 MCG/ACT NA SUSP: NASAL | 3 refills | Status: DC

## 2020-03-14 MED ORDER — GABAPENTIN 300 MG PO CAPS: ORAL_CAPSULE | ORAL | 1 refills | Status: DC

## 2020-03-14 MED ORDER — CARVEDILOL 12.5 MG PO TABS: 12.5000 mg | ORAL_TABLET | Freq: Two times a day (BID) | ORAL | 1 refills | Status: DC

## 2020-03-14 MED ORDER — HYDRALAZINE HCL 25 MG PO TABS
25.0000 mg | ORAL_TABLET | Freq: Three times a day (TID) | ORAL | 1 refills | Status: DC
Start: 2020-03-14 — End: 2020-05-16

## 2020-03-14 MED ORDER — DIAZEPAM 10 MG PO TABS
ORAL_TABLET | ORAL | 1 refills | Status: DC
Start: 2020-03-14 — End: 2020-09-22

## 2020-03-14 MED ORDER — POTASSIUM CHLORIDE ER 10 MEQ PO TBCR
EXTENDED_RELEASE_TABLET | ORAL | 1 refills | Status: DC
Start: 2020-03-14 — End: 2020-09-29

## 2020-03-14 MED ORDER — CLONIDINE HCL 0.1 MG PO TABS
0.1000 mg | ORAL_TABLET | Freq: Every day | ORAL | 1 refills | Status: DC
Start: 2020-03-14 — End: 2020-05-16

## 2020-03-14 MED ORDER — AMILORIDE HCL 5 MG PO TABS
5.0000 mg | ORAL_TABLET | Freq: Every day | ORAL | 1 refills | Status: DC
Start: 2020-03-14 — End: 2020-09-29

## 2020-03-14 MED ORDER — PREDNISONE 20 MG PO TABS: ORAL_TABLET | ORAL | 0 refills | Status: DC

## 2020-03-14 MED ORDER — BACLOFEN 10 MG PO TABS: ORAL_TABLET | ORAL | 3 refills | Status: DC

## 2020-03-14 MED ORDER — OMEPRAZOLE 40 MG PO CPDR
40.0000 mg | DELAYED_RELEASE_CAPSULE | Freq: Every day | ORAL | 1 refills | Status: DC
Start: 2020-03-14 — End: 2020-09-29

## 2020-03-14 NOTE — Patient Instructions (Addendum)
Take Tylenol 500 mg 2 pills 3 times daily as needed for sore throat throat.  I will give you a couple of days of prednisone since that has been successful in the past, but you should not need to take it long-term.  This is probably an allergic sore throat.  Drink plenty of fluids, get enough rest, and use lozenges as needed.  Continue your regular medication.  Primary care provider is closing its doors the end of March.  You will need to find a new primary care doctor.  In the meanwhile return as needed.  I would recommend minimizing the use of the diazepam (Valium) which is very habit-forming.  When you get a new doctor you need to review all your medications.   If you have lab work done today you will be contacted with your lab results within the next 2 weeks.  If you have not heard from [place] then please contact us. The fastest way to get your results is to register for My Chart.   IF you received an x-ray today, you will receive an invoice from [facility]. Please contact [facility] at [phone] with questions or concerns regarding your invoice.   IF you received labwork today, you will receive an invoice from [place]. Please contact [facility] at [phone] with questions or concerns regarding your invoice.   Our billing staff will not be able to assist you with questions regarding bills from these companies.  You will be contacted with the lab results as soon as they are available. The fastest way to get your results is to activate your My Chart account. Instructions are located on the last page of this paperwork. If you have not heard from [place] regarding the results in 2 weeks, please contact this office.

## 2020-03-14 NOTE — Progress Notes (Signed)
Patient ID: Brooke Wilson, female    DOB: 06-May-1955  Age: 65 y.o. MRN: [id number]  Chief Complaint  Patient presents with  . Medication Refill    Patient states she needs a medication refill on all medications. Per patient states she has been having some problems with her allergies.    Subjective:   Patient is here for refill of a long list of medications.  She has no major acute complaints.  She recently had her knee replaced.  Of note this practice will be closing in March, and she will need to find a new provider.  She takes most of her medicines fairly regularly.  The albuterol is as needed, the baclofen frequent as needed, Flonase as needed, but most of the meds are regularly.  She needs both the Prilosec and Pepcid and then take some Tums at times on top of that.  Current allergies, medications, problem list, past/family and social histories reviewed.  She has a slight sore throat.  She did a home Covid test this morning which was negative.  Objective:  BP 105/66   Pulse 73   Temp 97.9 F (36.6 C) (Temporal)   Resp 18   Ht 4\' 11"  (1.499 m)   Wt 144 lb 12.8 oz (65.7 kg)   SpO2 98%   BMI 29.25 kg/m   Chest clear.  Heart rate without murmur.  Throat not erythematous.  No significant nodes.  Sounds allergic.  Assessment & Plan:   Assessment: 1. Allergic pharyngitis   2. Mild intermittent asthma without complication   3. Chronic neck pain   4. Essential hypertension   5. Gastroesophageal reflux disease without esophagitis   6. Non-seasonal allergic rhinitis, unspecified trigger       Plan: See instructions.  A number of her medications list dye allergies, but these are all things she is already taking.  I continued them.  No orders of the defined types were placed in this encounter.   No orders of the defined types were placed in this encounter.        Patient Instructions   Take Tylenol 500 mg 2 pills 3 times daily as needed for sore throat  throat.  I will give you a couple of days of prednisone since that has been successful in the past, but you should not need to take it long-term.  This is probably an allergic sore throat.  Drink plenty of fluids, get enough rest, and use lozenges as needed.  Continue your regular medication.  Primary care provider is closing its doors the end of March.  You will need to find a new primary care doctor.  In the meanwhile return as needed.  I would recommend minimizing the use of the diazepam (Valium) which is very habit-forming.  When you get a new doctor you need to review all your medications.   If you have lab work done today you will be contacted with your lab results within the next 2 weeks.  If you have not heard from [place] then please contact us. The fastest way to get your results is to register for My Chart.   IF you received an x-ray today, you will receive an invoice from [facility]. Please contact [facility] at [phone] with questions or concerns regarding your invoice.   IF you received labwork today, you will receive an invoice from [place]. Please contact [facility] at [phone] with questions or concerns regarding your invoice.   Our billing staff will not be able  to assist you with questions regarding bills from these companies.  You will be contacted with the lab results as soon as they are available. The fastest way to get your results is to activate your My Chart account. Instructions are located on the last page of this paperwork. If you have not heard from [place] regarding the results in 2 weeks, please contact this office.         Return if symptoms worsen or fail to improve.   Janace Hoard, MD 03/14/2020

## 2020-03-18 NOTE — Telephone Encounter (Signed)
I was not able to reach or leave a message (VM not set up)

## 2020-03-18 NOTE — Telephone Encounter (Signed)
Mailed a letter to the patient to return our call.

## 2020-03-20 ENCOUNTER — Ambulatory Visit: Payer: Federal, State, Local not specified - PPO | Admitting: Cardiology

## 2020-04-18 ENCOUNTER — Ambulatory Visit: Payer: Federal, State, Local not specified - PPO | Admitting: Family

## 2020-04-18 ENCOUNTER — Ambulatory Visit: Payer: Federal, State, Local not specified - PPO | Admitting: Cardiology

## 2020-04-18 ENCOUNTER — Ambulatory Visit: Payer: Federal, State, Local not specified - PPO | Admitting: Family Medicine

## 2020-04-18 ENCOUNTER — Encounter: Payer: Self-pay | Admitting: Family

## 2020-04-18 ENCOUNTER — Other Ambulatory Visit: Payer: Self-pay

## 2020-04-18 VITALS — BP 126/62 | HR 72 | Temp 96.8°F | Ht 59.0 in | Wt 151.6 lb

## 2020-04-18 DIAGNOSIS — E782 Mixed hyperlipidemia: Secondary | ICD-10-CM

## 2020-04-18 DIAGNOSIS — M542 Cervicalgia: Secondary | ICD-10-CM

## 2020-04-18 DIAGNOSIS — N182 Chronic kidney disease, stage 2 (mild): Secondary | ICD-10-CM | POA: Diagnosis not present

## 2020-04-18 DIAGNOSIS — K219 Gastro-esophageal reflux disease without esophagitis: Secondary | ICD-10-CM

## 2020-04-18 DIAGNOSIS — M150 Primary generalized (osteo)arthritis: Secondary | ICD-10-CM

## 2020-04-18 DIAGNOSIS — M8949 Other hypertrophic osteoarthropathy, multiple sites: Secondary | ICD-10-CM

## 2020-04-18 DIAGNOSIS — R7303 Prediabetes: Secondary | ICD-10-CM | POA: Diagnosis not present

## 2020-04-18 DIAGNOSIS — I10 Essential (primary) hypertension: Secondary | ICD-10-CM

## 2020-04-18 DIAGNOSIS — J302 Other seasonal allergic rhinitis: Secondary | ICD-10-CM

## 2020-04-18 DIAGNOSIS — M159 Polyosteoarthritis, unspecified: Secondary | ICD-10-CM

## 2020-04-18 NOTE — Patient Instructions (Signed)

## 2020-04-18 NOTE — Progress Notes (Signed)
Provider: Marlowe Sax FNP-C   System, Provider Not In  Patient Care Team: System, Provider Not In as PCP - General Bettina Gavia Hilton Cork, MD as Referring Physician (Cardiology) Jamal Maes, MD as Consulting Physician (Nephrology)  Extended Emergency Contact Information Primary Emergency Contact: Bessire,Sotero Address: [address] [address] [place] Litter of [place] Phone: [phone] Mobile Phone: [phone] Relation: Spouse Secondary Emergency Contact: [place] Mobile Phone: [phone] Relation: Daughter  Code Status: Full Code  Goals of care: Advanced Directive information Advanced Directives 12/25/2019  Does Patient Have a Medical Advance Directive? No  Would patient like information on creating a medical advance directive? No - Patient declined     Chief Complaint  Patient presents with  . Medical Management of Chronic Issues    Patient here today to establish care since having knee replacements. CT was recently done with Urologist.     HPI:  Pt is a 65 y.o. female seen today to establish care for medical management of chronic diseases.she denies any acute issues today.she has a medical history of hypertension,Hyperlipidemia,Chronic Kidney Disease stage 3,Prediabetes,Osteoarthritis of multiple sites,seasonal allergies,chronic neck pain among other condition.   Had Knee replacement  11/22/ 2021 done by Dr.Aluisio Pilar Plate . Pain controlled on tramadol 50 mg tablet every 12 hrs and Ibuprofen.   Hypertension - No home blood pressure readings for review.Her blood pressure is within normal range today.She denies any symptoms of hypotension.current on amiloride 5 mg tablet daily,coreg 12.5 mg tablet twice daily,Diltiazem 240 mg 24 hr daily and clonidine  Hyperperlipidemia - on rosuvastatin 40 mg tablet daily.recent LDL was 75 ( 03/06/2020). States does exercise four times in the gymn.    Allergies - takes Prednisone when allergies are really  bad which works for her.Also on singulair.   Spasm - chronic pain on neck.takes valium for spasm.    Constipation - on lactulose works best nothing else.   Diverticulitis - had sepsis due to perforated sigmoid colon admitted 11/30/2018 -12/04/2018      Past Medical History:  Diagnosis Date  . Acute maxillary sinusitis 11/12/2015  . Allergy   . Anxiety state 05/17/2011  . AR (allergic rhinitis) 02/17/2012  . Asthma   . BMI 31.0-31.9,adult 03/23/2011  . Cough 03/23/2011  . Dental disease 02/17/2012   [place] 01[place]   . Essential hypertension, benign 03/23/2011  . GERD (gastroesophageal reflux disease) 05/17/2011  . H/O varicella   . History of measles, mumps, or rubella   . Hyperlipemia 03/23/2011  . Hyperlipidemia   . Hypertension   . Hypertensive disorder 09/30/2016  . Insomnia 05/17/2011  . Mid back pain 02/17/2012  . Neck pain 02/17/2012  . Obesity 09/30/2016  . 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 RAD (reactive airway disease) 02/17/2012   Past Surgical History:  Procedure Laterality Date  . REPLACEMENT TOTAL KNEE Right   . TOTAL KNEE ARTHROPLASTY Left 01/07/2020   Procedure: TOTAL KNEE ARTHROPLASTY;  Surgeon: Gaynelle Arabian, MD;  Location: [facility];  Service: Orthopedics;  Laterality: Left;  91mn  . TUBAL LIGATION    . VAGINAL HYSTERECTOMY     uterine prolapse; DUB; ovaries intact.  [place]    Allergies  Allergen Reactions  . Ace Inhibitors Cough  . Iodinated Diagnostic Agents Hives    Chills  . Lisinopril Cough  . Sulfa Antibiotics Hives  . Red Dye   . Tape     Surgical tape causes itching  . Yellow Dye   .  Bactrim Rash  . Flexeril [Cyclobenzaprine Hcl] Nausea Only  . Latex Itching  . Naprosyn [Naproxen] Nausea And Vomiting    And high doses of NSAIDS    Allergies as of 04/18/2020      Reactions   Ace Inhibitors Cough   Iodinated Diagnostic Agents Hives   Chills   Lisinopril Cough   Sulfa Antibiotics Hives   Red Dye    Tape    Surgical tape  causes itching   Yellow Dye    Bactrim Rash   Flexeril [cyclobenzaprine Hcl] Nausea Only   Latex Itching   Naprosyn [naproxen] Nausea And Vomiting   And high doses of NSAIDS      Medication List       Accurate as of April 18, 2020  1:28 PM. If you have any questions, ask your nurse or doctor.        STOP taking these medications   gabapentin 300 MG capsule Commonly known as: Neurontin Stopped by: Sandrea Hughs, NP     TAKE these medications   albuterol 108 (90 Base) MCG/ACT inhaler Commonly known as: Proventil HFA INHALE 2 PUFFS INTO THE LUNGS EVERY 6 HOURS AS NEEDED FOR WHEEZING OR SHORTNESS OF BREATH   aMILoride 5 MG tablet Commonly known as: MIDAMOR Take 1 tablet (5 mg total) by mouth daily.   ascorbic acid 500 MG tablet Commonly known as: VITAMIN C Take 500 mg by mouth daily.   baclofen 10 MG tablet Commonly known as: LIORESAL Take 3 times daily as needed for muscle relaxant for neck   carvedilol 12.5 MG tablet Commonly known as: COREG Take 1 tablet (12.5 mg total) by mouth 2 (two) times daily.   cloNIDine 0.1 MG tablet Commonly known as: CATAPRES Take 1 tablet (0.1 mg total) by mouth at bedtime.   diazepam 10 MG tablet Commonly known as: VALIUM TAKE 1 TABLET BY MOUTH EVERY DAY AS NEEDED FOR MUSCLE SPASM   diltiazem 120 MG 24 hr capsule Commonly known as: CARDIZEM CD Take 1 capsule (120 mg total) by mouth every morning. What changed: Another medication with the same name was changed. Make sure you understand how and when to take each.   diltiazem 240 MG 24 hr capsule Commonly known as: CARDIZEM CD Take 1 capsule (240 mg total) by mouth daily. What changed: when to take this   famotidine 40 MG tablet Commonly known as: PEPCID Take 1 tablet (40 mg total) by mouth 2 (two) times daily.   fluticasone 50 MCG/ACT nasal spray Commonly known as: FLONASE PLACE 2 SPRAYS IN THE NOSTRIL EVERY DAY   hydrALAZINE 25 MG tablet Commonly known as:  APRESOLINE Take 1 tablet (25 mg total) by mouth 3 (three) times daily.   ibuprofen 800 MG tablet Commonly known as: ADVIL Take 800 mg by mouth in the morning, at noon, and at bedtime.   lactulose 10 GM/15ML solution Commonly known as: CHRONULAC Take 15 mLs (10 g total) by mouth 3 (three) times daily.   montelukast 10 MG tablet Commonly known as: SINGULAIR Take 1 tablet (10 mg total) by mouth at bedtime.   omeprazole 40 MG capsule Commonly known as: PRILOSEC Take 1 capsule (40 mg total) by mouth daily.   potassium chloride 10 MEQ tablet Commonly known as: Klor-Con 10 TAKE 3 TABLETS BY MOUTH EVERY DAY   predniSONE 20 MG tablet Commonly known as: DELTASONE Take 1 daily for 3 days for sore throat.   rosuvastatin 40 MG tablet Commonly known as: CRESTOR Take  1 tablet (40 mg total) by mouth daily.   traMADol 50 MG tablet Commonly known as: ULTRAM TAKE 1 TABLET BY MOUTH EVERY 12 HOURS AS NEEDED FOR MODERATE PAIN   zinc sulfate 220 (50 Zn) MG capsule Take 1 capsule (220 mg total) by mouth daily.       Review of Systems  Constitutional: Negative for appetite change, chills, fatigue and fever.  HENT: Negative for congestion, dental problem, hearing loss, postnasal drip, rhinorrhea, sinus pressure, sinus pain, sneezing and sore throat.   Eyes: Positive for visual disturbance. Negative for discharge, redness and itching.       Wears eye glasses sees Dr.Patel   Respiratory: Negative for cough, chest tightness, shortness of breath and wheezing.   Cardiovascular: Negative for chest pain, palpitations and leg swelling.  Gastrointestinal: Positive for constipation. Negative for abdominal distention, abdominal pain, diarrhea, nausea and vomiting.  Endocrine: Negative for cold intolerance, heat intolerance, polydipsia, polyphagia and polyuria.  Genitourinary: Negative for difficulty urinating, dysuria, flank pain, frequency, urgency, vaginal bleeding and vaginal discharge.   Musculoskeletal: Positive for arthralgias and back pain. Negative for gait problem, joint swelling, myalgias and neck pain.  Skin: Negative for color change, pallor and rash.  Neurological: Negative for dizziness, speech difficulty, weakness, light-headedness and headaches.  Hematological: Does not bruise/bleed easily.  Psychiatric/Behavioral: Negative for agitation, behavioral problems, hallucinations, self-injury, sleep disturbance and suicidal ideas. The patient is not nervous/anxious.        Sleeps 8 hrs     Immunization History  Administered Date(s) Administered  . Influenza,inj,Quad PF,6+ Mos 12/14/2018, 10/18/2019  . Influenza,inj,quad, With Preservative 11/16/2014, 11/16/2018  . Influenza-Unspecified 11/13/2014, 12/17/2015, 12/16/2016  . Moderna Sars-Covid-2 Vaccination 02/26/2019, 03/24/2019, 01/03/2020  . Td 12/17/2006   Pertinent  Health Maintenance Due  Topic Date Due  . PAP SMEAR-Modifier  11/14/2019  . MAMMOGRAM  08/30/2021  . COLONOSCOPY (Pts 45-96yr Insurance coverage will need to be confirmed)  03/29/2029  . INFLUENZA VACCINE  Completed   Fall Risk  04/18/2020 03/14/2020 12/28/2019 12/13/2019 04/19/2019  Falls in the past year? 0 0 0 0 0  Number falls in past yr: 0 0 0 0 0  Injury with Fall? - 0 0 0 0  Risk for fall due to : - - - - Impaired balance/gait  Risk for fall due to: Comment - - - - candidate for knee pain  Follow up - Falls evaluation completed Falls evaluation completed Falls evaluation completed -   Functional Status Survey:    Vitals:   04/18/20 1305  BP: 126/62  Pulse: 72  Temp: (!) 96.8 F (36 C)  SpO2: 98%  Weight: 151 lb 9.6 oz (68.8 kg)  Height: '4\' 11"'  (1.499 m)   Body mass index is 30.62 kg/m. Physical Exam Vitals reviewed.  Constitutional:      General: She is not in acute distress.    Appearance: She is obese. She is not ill-appearing.  HENT:     Head: Normocephalic.     Right Ear: Tympanic membrane, ear canal and external ear  normal. There is no impacted cerumen.     Left Ear: Tympanic membrane, ear canal and external ear normal. There is no impacted cerumen.     Nose: Nose normal. No congestion or rhinorrhea.     Mouth/Throat:     Mouth: Mucous membranes are moist.     Pharynx: Oropharynx is clear. No oropharyngeal exudate or posterior oropharyngeal erythema.  Eyes:     General: No scleral icterus.  Right eye: No discharge.        Left eye: No discharge.     Extraocular Movements: Extraocular movements intact.     Conjunctiva/sclera: Conjunctivae normal.     Pupils: Pupils are equal, round, and reactive to light.     Comments: Corrective lens in place   Neck:     Vascular: No carotid bruit.  Cardiovascular:     Rate and Rhythm: Normal rate and regular rhythm.     Pulses: Normal pulses.     Heart sounds: Normal heart sounds. No murmur heard. No friction rub. No gallop.   Pulmonary:     Effort: Pulmonary effort is normal. No respiratory distress.     Breath sounds: Normal breath sounds. No wheezing, rhonchi or rales.  Abdominal:     General: Bowel sounds are normal. There is no distension.     Palpations: Abdomen is soft. There is no mass.     Tenderness: There is no abdominal tenderness. There is no right CVA tenderness, left CVA tenderness, guarding or rebound.  Musculoskeletal:        General: No swelling or tenderness. Normal range of motion.     Cervical back: Normal range of motion. No rigidity or tenderness.     Right lower leg: No edema.     Left lower leg: No edema.  Lymphadenopathy:     Cervical: No cervical adenopathy.  Skin:    General: Skin is warm and dry.     Coloration: Skin is not pale.     Findings: No bruising, erythema or rash.  Neurological:     Mental Status: She is alert and oriented to person, place, and time.     Cranial Nerves: No cranial nerve deficit.     Sensory: No sensory deficit.     Motor: No weakness.     Coordination: Coordination normal.     Gait: Gait  normal.  Psychiatric:        Mood and Affect: Mood normal.        Behavior: Behavior normal.        Thought Content: Thought content normal.        Judgment: Judgment normal.     Labs reviewed: Recent Labs    09/04/19 1350 12/25/19 1021 03/06/20 0856  NA 143 141 140  K 5.0 4.0 4.2  CL 109* 112* 108*  CO2 21 22 19*  GLUCOSE 80 107* 94  BUN '24 18 19  ' CREATININE 0.86 0.81 0.68  CALCIUM 9.5 9.4 9.3   Recent Labs    12/25/19 1021 03/06/20 0856  AST 15 12  ALT 11 13  ALKPHOS 90 87  BILITOT 0.3 <0.2  PROT 7.3 6.4  ALBUMIN 3.8 4.0   Recent Labs    09/04/19 1350 12/25/19 1021 03/06/20 1028  WBC 6.5 5.8 8.9  NEUTROABS 3.7  --   --   HGB 12.2 12.4 11.6  HCT 38.5 40.9 37.5  MCV 84 87.6 84  PLT 221 283 242   Lab Results  Component Value Date   TSH 1.990 04/18/2018   Lab Results  Component Value Date   HGBA1C 5.4 03/06/2020   Lab Results  Component Value Date   CHOL 173 03/06/2020   HDL 81 03/06/2020   LDLCALC 75 03/06/2020   TRIG 95 03/06/2020   CHOLHDL 2.1 03/06/2020    Significant Diagnostic Results in last 30 days:  No results found.  Assessment/Plan 1. Essential hypertension B/p at goal.continue on amiloride 5 mg tablet daily,coreg  12.5 mg tablet twice daily,Diltiazem 240 mg 24 hr daily and clonidine - on Statin  - CBC with Differential/Platelet; Future - CMP with eGFR([facility]); Future - TSH; Future  2. Gastroesophageal reflux disease without esophagitis Stable on famotidine and Omeprazole no GI bleed symptoms.  - CBC with Differential/Platelet; Future  3. Prediabetes Previous glucose high. - dietary and lifestyle modification discussed at length. - TSH; Future - Hemoglobin A1c; Future  4. CKD (chronic kidney disease), stage II Continue to avoid Nephrotoxins and dose all medication for renal clearance.   5. Primary osteoarthritis involving multiple joints Continue on current pain regimen.PDMP reviewed. Narcortic use contract discussed and  signed this visit.  - Urine Drug Screen w/Alc, no confirm([facility])  6. Seasonal allergies Continue on flonase and singluair   7. Mixed hyperlipidemia Dietary modification and exercise advised. - continue on Rosuvastatin  - Lipid panel; Future  8. Neck pain Continue current pain regimen. - Urine Drug Screen w/Alc, no confirm([facility])  Family/ staff Communication: Reviewed plan of care with patient verbalized understanding.   Labs/tests ordered:  - CBC with Differential/Platelet; Future - CMP with eGFR([facility]); Future - TSH; Future - Lipid panel; Future - Hemoglobin A1c; Future - Urine Drug Screen w/Alc, no confirm([facility])  Next Appointment : 6 months for medical management of chronic issues.Fasting labs prior to visit.   Sandrea Hughs, NP

## 2020-04-20 LAB — DRUG MONITOR, PANEL 1, W/CONF, URINE
Alphahydroxyalprazolam: NEGATIVE ng/mL (ref <25)
Alphahydroxymidazolam: NEGATIVE ng/mL (ref <50)
Alphahydroxytriazolam: NEGATIVE ng/mL (ref <50)
Aminoclonazepam: NEGATIVE ng/mL (ref <25)
Amphetamines: NEGATIVE ng/mL (ref <500)
Barbiturates: NEGATIVE ng/mL (ref <300)
Benzodiazepines: POSITIVE ng/mL — AB (ref <100)
Cocaine Metabolite: NEGATIVE ng/mL (ref <150)
Creatinine: 81.2 mg/dL
Hydroxyethylflurazepam: NEGATIVE ng/mL (ref <50)
Lorazepam: NEGATIVE ng/mL (ref <50)
Marijuana Metabolite: NEGATIVE ng/mL (ref <20)
Methadone Metabolite: NEGATIVE ng/mL (ref <100)
Nordiazepam: 205 ng/mL — ABNORMAL HIGH (ref <50)
Opiates: NEGATIVE ng/mL (ref <100)
Oxazepam: 919 ng/mL — ABNORMAL HIGH (ref <50)
Oxidant: NEGATIVE ug/mL
Oxycodone: NEGATIVE ng/mL (ref <100)
Phencyclidine: NEGATIVE ng/mL (ref <25)
Temazepam: 219 ng/mL — ABNORMAL HIGH (ref <50)
pH: 5.8 (ref 4.5–9.0)

## 2020-04-20 LAB — DM TEMPLATE

## 2020-05-15 NOTE — Progress Notes (Signed)
Cardiology Office Note:    Date:  05/16/2020   ID:  Brooke Wilson, DOB 04-17-1955, MRN [id number]  PCP:  System, Provider Not In  Cardiologist:  Norman Herrlich, MD    Referring MD: No ref. provider found    ASSESSMENT:    1. Resistant hypertension   2. Mixed hyperlipidemia    PLAN:    In order of problems listed above:  1. Well-controlled I appreciate nephrology and  clearly MRA was helpful for blood pressure control edema and hypokalemia continue current regimen recheck renal function potassium and she declines decreasing the dose with her calcium channel blocker at this time we will continue to trend blood pressures at home.  She is on multidrug regimen including distal diuretic beta-blocker central active clonidine calcium channel blocker hydralazine. 2. Stable continue her statin lipids at target   Next appointment: 6 months   Medication Adjustments/Labs and Tests Ordered: Current medicines are reviewed at length with the patient today.  Concerns regarding medicines are outlined above.  No orders of the defined types were placed in this encounter.  No orders of the defined types were placed in this encounter.   Chief Complaint  Patient presents with  . Hypertension    History of Present Illness:    Brooke Wilson is a 65 y.o. female with a hx of  difficult to control resistant hypertension and hyperlipidemia 09/06/2019 last seen.  In the interim she has been seen by nephrology [facility] loop diuretic discontinued placed on distal diuretic.  Blood pressure has been in range today she is well controlled often decrease the dose of her calcium channel blocker she has had edema and she declines at this time.  We will recheck her renal function potassium no headache shortness of breath palpitations or syncope has mild edema improvement support hose lower extremities  Compliance with diet, lifestyle and medications: Yes Past Medical History:  Diagnosis Date   . Acute maxillary sinusitis 11/12/2015  . Allergy   . Anxiety state 05/17/2011  . AR (allergic rhinitis) 02/17/2012  . Asthma   . BMI 31.0-31.9,adult 03/23/2011  . Cough 03/23/2011  . Dental disease 02/17/2012   [place] in [place] 01/2012   . Essential hypertension, benign 03/23/2011  . GERD (gastroesophageal reflux disease) 05/17/2011  . H/O varicella   . History of measles, mumps, or rubella   . Hyperlipemia 03/23/2011  . Hyperlipidemia   . Hypertension   . Hypertensive disorder 09/30/2016  . Insomnia 05/17/2011  . Mid back pain 02/17/2012  . Neck pain 02/17/2012  . Obesity 09/30/2016  . 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 RAD (reactive airway disease) 02/17/2012    Past Surgical History:  Procedure Laterality Date  . REPLACEMENT TOTAL KNEE Right   . TOTAL KNEE ARTHROPLASTY Left 01/07/2020   Procedure: TOTAL KNEE ARTHROPLASTY;  Surgeon: Ollen Gross, MD;  Location: [facility];  Service: Orthopedics;  Laterality: Left;   . TUBAL LIGATION    . VAGINAL HYSTERECTOMY     uterine prolapse; DUB; ovaries intact.  [place]    Current Medications: Current Meds  Medication Sig  . albuterol (PROVENTIL HFA) 108 (90 Base) MCG/ACT inhaler INHALE 2 PUFFS INTO THE LUNGS EVERY 6 HOURS AS NEEDED FOR WHEEZING OR SHORTNESS OF BREATH  . aMILoride (MIDAMOR) 5 MG tablet Take 1 tablet (5 mg total) by mouth daily.  Marland Kitchen ascorbic acid (VITAMIN C) 500 MG tablet Take 500 mg by mouth daily.  . baclofen (LIORESAL) 10 MG tablet Take 3 times  daily as needed for muscle relaxant for neck  . carvedilol (COREG) 12.5 MG tablet Take 1 tablet (12.5 mg total) by mouth 2 (two) times daily.  . cloNIDine (CATAPRES) 0.1 MG tablet Take 1 tablet (0.1 mg total) by mouth at bedtime.  . diazepam (VALIUM) 10 MG tablet TAKE 1 TABLET BY MOUTH EVERY DAY AS NEEDED FOR MUSCLE SPASM  . diltiazem (CARDIZEM CD) 120 MG 24 hr capsule Take 1 capsule (120 mg total) by mouth every morning.  . diltiazem (CARDIZEM CD) 240 MG 24 hr capsule Take 1  capsule (240 mg total) by mouth daily. (Patient taking differently: Take 240 mg by mouth at bedtime.)  . famotidine (PEPCID) 40 MG tablet Take 1 tablet (40 mg total) by mouth 2 (two) times daily.  . fluticasone (FLONASE) 50 MCG/ACT nasal spray PLACE 2 SPRAYS IN THE NOSTRIL EVERY DAY  . hydrALAZINE (APRESOLINE) 25 MG tablet Take 1 tablet (25 mg total) by mouth 3 (three) times daily.  Marland Kitchen. ibuprofen (ADVIL) 800 MG tablet Take 800 mg by mouth in the morning, at noon, and at bedtime.  Marland Kitchen. lactulose (CHRONULAC) 10 GM/15ML solution Take 15 mLs (10 g total) by mouth 3 (three) times daily.  . montelukast (SINGULAIR) 10 MG tablet Take 1 tablet (10 mg total) by mouth at bedtime.  Marland Kitchen. omeprazole (PRILOSEC) 40 MG capsule Take 1 capsule (40 mg total) by mouth daily.  . potassium chloride (KLOR-CON 10) 10 MEQ tablet TAKE 3 TABLETS BY MOUTH EVERY DAY  . predniSONE (DELTASONE) 20 MG tablet Take 1 daily for 3 days for sore throat.  . rosuvastatin (CRESTOR) 40 MG tablet Take 1 tablet (40 mg total) by mouth daily.  . traMADol (ULTRAM) 50 MG tablet TAKE 1 TABLET BY MOUTH EVERY 12 HOURS AS NEEDED FOR MODERATE PAIN  . zinc sulfate 220 MG capsule Take 1 capsule (220 mg total) by mouth daily.     Allergies:   Ace inhibitors, Iodinated diagnostic agents, Lisinopril, Sulfa antibiotics, Red dye, Tape, Yellow dye, Bactrim, Flexeril [cyclobenzaprine hcl], Latex, and Naprosyn [naproxen]   Social History   Socioeconomic History  . Marital status: Married    Spouse name: Not on file  . Number of children: Not on file  . Years of education: Not on file  . Highest education level: Not on file  Occupational History  . Not on file  Tobacco Use  . Smoking status: Never Smoker  . Smokeless tobacco: Never Used  Vaping Use  . Vaping Use: Never used  Substance and Sexual Activity  . Alcohol use: No    Alcohol/week: 0.0 standard drinks  . Drug use: No  . Sexual activity: Yes    Birth control/protection: Post-menopausal,  Surgical    Comment: Hysterectomy  Other Topics Concern  . Not on file  Social History Narrative   Marital status:  Mar[place] x 38 years; [place]; moved to [place] in 1987.      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      Advanced Directives: DNR/DNI; HCPOA: Sati Hulen-En[place]         Tobacco use, amount per day now: None.   Past tobacco use, amount  per day: None.   How many years did you use tobacco: N/A   Alcohol use (drinks per week): N/A   Diet: Regular.   Do you drink/eat things with caffeine: No.   Marital status:  Married                                What year were you married? 1981   Do you live in a house, apartment, assisted living, condo, trailer, etc.? House.   Is it one or more stories?    How many persons live in your home? 2   Do you have pets in your home?( please list) None.   Highest Level of education completed? Bachelors Degree in Nursing.   Current or past profession: RN   Do you exercise?     Yes                             Type and how often? 4 times a week at the gym.   Do you have a living will? No.   Do you have a DNR form? No.                                  If not, do you want to discuss one?   Do you have signed POA/HPOA forms?  No.                      If so, please bring to you appointment      Do you have any difficulty bathing or dressing yourself? No.   Do you have any difficulty preparing food or eating? No.   Do you have any difficulty managing your medications? No.   Do you have any difficulty managing your finances? No.   Do you have any difficulty affording your medications? No.   Social Determinants of Health   Financial Resource Strain: Not on file  Food Insecurity: Not on file  Transportation Needs: Not on file   Physical Activity: Not on file  Stress: Not on file  Social Connections: Not on file     Family History: The patient's family history includes Cancer in her maternal grandmother; Hypertension in her brother, mother, and sister; Stroke in her brother and mother; Stroke (age of onset: 84) in her sister. ROS:   Please see the history of present illness.    All other systems reviewed and are negative.  EKGs/Labs/Other Studies Reviewed:    The following studies were reviewed today:  Recent Labs: 03/06/2020: ALT 13; BUN 19; Creatinine, Ser 0.68; Hemoglobin 11.6; Platelets 242; Potassium 4.2; Sodium 140  Recent Lipid Panel    Component Value Date/Time   CHOL 173 03/06/2020 0856   TRIG 95 03/06/2020 0856   HDL 81 03/06/2020 0856   CHOLHDL 2.1 03/06/2020 0856   CHOLHDL 2.9 02/17/2015 1956   VLDL 35 (H) 02/17/2015 1956   LDLCALC 75 03/06/2020 0856    Physical Exam:    VS:  BP 112/60   Pulse 74   Ht 4\' 11"  (1.499 m)   Wt 151 lb (68.5 kg)   SpO2 97%   BMI 30.50 kg/m     Wt Readings from Last 3 Encounters:  05/16/20 151 lb (68.5 kg)  04/18/20 151 lb 9.6 oz (68.8 kg)  03/14/20 144 lb  12.8 oz (65.7 kg)     GEN:  Well nourished, well developed in no acute distress HEENT: Normal NECK: No JVD; No carotid bruits LYMPHATICS: No lymphadenopathy CARDIAC: RRR, no murmurs, rubs, gallops RESPIRATORY:  Clear to auscultation without rales, wheezing or rhonchi  ABDOMEN: Soft, non-tender, non-distended MUSCULOSKELETAL: Mild bilateral lower extremity dependent edema; No deformity  SKIN: Warm and dry NEUROLOGIC:  Alert and oriented x 3 PSYCHIATRIC:  Normal affect    Signed, Norman Herrlich, MD  05/16/2020 8:14 AM    [facility]

## 2020-05-16 ENCOUNTER — Ambulatory Visit: Payer: Federal, State, Local not specified - PPO | Admitting: Cardiology

## 2020-05-16 ENCOUNTER — Encounter: Payer: Self-pay | Admitting: Cardiology

## 2020-05-16 ENCOUNTER — Other Ambulatory Visit: Payer: Self-pay

## 2020-05-16 VITALS — BP 112/60 | HR 74 | Ht 59.0 in | Wt 151.0 lb

## 2020-05-16 DIAGNOSIS — K5904 Chronic idiopathic constipation: Secondary | ICD-10-CM

## 2020-05-16 DIAGNOSIS — E782 Mixed hyperlipidemia: Secondary | ICD-10-CM | POA: Diagnosis not present

## 2020-05-16 DIAGNOSIS — K5730 Diverticulosis of large intestine without perforation or abscess without bleeding: Secondary | ICD-10-CM

## 2020-05-16 DIAGNOSIS — R748 Abnormal levels of other serum enzymes: Secondary | ICD-10-CM

## 2020-05-16 DIAGNOSIS — R933 Abnormal findings on diagnostic imaging of other parts of digestive tract: Secondary | ICD-10-CM | POA: Insufficient documentation

## 2020-05-16 DIAGNOSIS — I10 Essential (primary) hypertension: Secondary | ICD-10-CM | POA: Diagnosis not present

## 2020-05-16 HISTORY — DX: Chronic idiopathic constipation: K59.04

## 2020-05-16 HISTORY — DX: Abnormal findings on diagnostic imaging of other parts of digestive tract: R93.3

## 2020-05-16 HISTORY — DX: Abnormal levels of other serum enzymes: R74.8

## 2020-05-16 HISTORY — DX: Diverticulosis of large intestine without perforation or abscess without bleeding: K57.30

## 2020-05-16 MED ORDER — CLONIDINE HCL 0.1 MG PO TABS
0.1000 mg | ORAL_TABLET | Freq: Every day | ORAL | 3 refills | Status: DC
Start: 2020-05-16 — End: 2020-09-29

## 2020-05-16 MED ORDER — CARVEDILOL 12.5 MG PO TABS: 12.5000 mg | ORAL_TABLET | Freq: Two times a day (BID) | ORAL | 3 refills | Status: DC

## 2020-05-16 MED ORDER — DILTIAZEM HCL ER COATED BEADS 120 MG PO CP24
120.0000 mg | ORAL_CAPSULE | Freq: Every morning | ORAL | 3 refills | Status: DC
Start: 2020-05-16 — End: 2020-09-29

## 2020-05-16 MED ORDER — DILTIAZEM HCL ER COATED BEADS 240 MG PO CP24
240.0000 mg | ORAL_CAPSULE | Freq: Every day | ORAL | 3 refills | Status: DC
Start: 2020-05-16 — End: 2020-09-29

## 2020-05-16 MED ORDER — ROSUVASTATIN CALCIUM 40 MG PO TABS
40.0000 mg | ORAL_TABLET | Freq: Every day | ORAL | 3 refills | Status: DC
Start: 2020-05-16 — End: 2020-09-29

## 2020-05-16 MED ORDER — HYDRALAZINE HCL 25 MG PO TABS
25.0000 mg | ORAL_TABLET | Freq: Three times a day (TID) | ORAL | 3 refills | Status: DC
Start: 2020-05-16 — End: 2020-09-29

## 2020-05-16 NOTE — Patient Instructions (Signed)
Medication Instructions:  Your physician recommends that you continue on your current medications as directed. Please refer to the Current Medication list given to you today.  *If you need a refill on your cardiac medications before your next appointment, please call your pharmacy*   Lab Work: Your physician recommends that you return for lab work in: TODAY BMP0 If you have labs (blood work) drawn today and your tests are completely normal, you will receive your results only by: Marland Kitchen MyChart Message (if you have MyChart) OR . A paper copy in the mail If you have any lab test that is abnormal or we need to change your treatment, we will call you to review the results.   Testing/Procedures: None   Follow-Up: At [facility], you and your health needs are our priority.  As part of our continuing mission to provide you with exceptional heart care, we have created designated Provider Care Teams.  These Care Teams include your primary Cardiologist (physician) and Advanced Practice Providers (APPs -  Physician Assistants and Nurse Practitioners) who all work together to provide you with the care you need, when you need it.  We recommend signing up for the patient portal called "MyChart".  Sign up information is provided on this After Visit Summary.  MyChart is used to connect with patients for Virtual Visits (Telemedicine).  Patients are able to view lab/test results, encounter notes, upcoming appointments, etc.  Non-urgent messages can be sent to your provider as well.   To learn more about what you can do with MyChart, go to [email or web address].    Your next appointment:   6 month(s)  The format for your next appointment:   In Person  Provider:   Norman Herrlich, MD  Other Instructions

## 2020-05-16 NOTE — Addendum Note (Signed)
Addended by: Delorse Limber I on: 05/16/2020 08:22 AM   Modules accepted: Orders

## 2020-05-17 LAB — BASIC METABOLIC PANEL
BUN/Creatinine Ratio: 21 (ref 12–28)
BUN: 15 mg/dL (ref 8–27)
CO2: 20 mmol/L (ref 20–29)
Calcium: 9.2 mg/dL (ref 8.7–10.3)
Chloride: 109 mmol/L — ABNORMAL HIGH (ref 96–106)
Creatinine, Ser: 0.72 mg/dL (ref 0.57–1.00)
Glucose: 95 mg/dL (ref 65–99)
Potassium: 4.9 mmol/L (ref 3.5–5.2)
Sodium: 145 mmol/L — ABNORMAL HIGH (ref 134–144)
eGFR: 93 mL/min/{1.73_m2} (ref >59)

## 2020-05-19 ENCOUNTER — Telehealth: Payer: Self-pay

## 2020-05-19 NOTE — Telephone Encounter (Signed)
Tried calling patient. No answer and no voicemail set up for me to leave a message. 

## 2020-05-20 ENCOUNTER — Telehealth: Payer: Self-pay

## 2020-05-20 NOTE — Telephone Encounter (Signed)
Patient returning call.

## 2020-05-20 NOTE — Telephone Encounter (Signed)
Spoke with patient regarding results and recommendation.  Patient verbalizes understanding and is agreeable to plan of care. Advised patient to call back with any issues or concerns.  

## 2020-05-20 NOTE — Telephone Encounter (Signed)
Tried calling patient. No answer and no voicemail set up for me to leave a message. 

## 2020-09-06 DIAGNOSIS — Z1231 Encounter for screening mammogram for malignant neoplasm of breast: Secondary | ICD-10-CM | POA: Diagnosis not present

## 2020-09-22 ENCOUNTER — Other Ambulatory Visit: Payer: Self-pay | Admitting: Family

## 2020-09-22 ENCOUNTER — Other Ambulatory Visit: Payer: Self-pay

## 2020-09-22 MED ORDER — DIAZEPAM 10 MG PO TABS: ORAL_TABLET | ORAL | 1 refills | Status: DC

## 2020-09-22 MED ORDER — TRAMADOL HCL 50 MG PO TABS: ORAL_TABLET | ORAL | 1 refills | Status: DC

## 2020-09-22 MED ORDER — DIAZEPAM 10 MG PO TABS
ORAL_TABLET | ORAL | 1 refills | Status: DC
Start: 2020-09-22 — End: 2020-09-22

## 2020-09-22 NOTE — Telephone Encounter (Signed)
Patient called and stated that she had spoke to Jettie Booze, CMA and she informed her to call the pharmacy and see which medication she needed refills on. She stated that she called the pharmacy and they told her he needed refills on everything. She told me she needs refills on Valium and Tramadol. I called the pharmacy to clarify which medications patient needs refills on. The pharmacist stated that she did speak with patient and she had only told her that she needed refill on Valium and tramadol the two medications that the patient requested. Patient has appointment scheduled 09/29/2020 with Richarda Blade, NP Patient has updated agreement on file.  Medications pended and sent to Richarda Blade, NP for approval.

## 2020-09-29 ENCOUNTER — Other Ambulatory Visit: Payer: Federal, State, Local not specified - PPO

## 2020-09-29 ENCOUNTER — Other Ambulatory Visit: Payer: Self-pay

## 2020-09-29 ENCOUNTER — Encounter: Payer: Self-pay | Admitting: Family

## 2020-09-29 ENCOUNTER — Ambulatory Visit: Payer: Federal, State, Local not specified - PPO | Admitting: Family

## 2020-09-29 VITALS — BP 124/64 | HR 61 | Temp 97.8°F | Resp 16 | Ht 59.0 in | Wt 149.2 lb

## 2020-09-29 DIAGNOSIS — E782 Mixed hyperlipidemia: Secondary | ICD-10-CM

## 2020-09-29 DIAGNOSIS — I10 Essential (primary) hypertension: Secondary | ICD-10-CM

## 2020-09-29 DIAGNOSIS — J4520 Mild intermittent asthma, uncomplicated: Secondary | ICD-10-CM

## 2020-09-29 DIAGNOSIS — Z124 Encounter for screening for malignant neoplasm of cervix: Secondary | ICD-10-CM

## 2020-09-29 DIAGNOSIS — Z0001 Encounter for general adult medical examination with abnormal findings: Secondary | ICD-10-CM | POA: Diagnosis not present

## 2020-09-29 DIAGNOSIS — M159 Polyosteoarthritis, unspecified: Secondary | ICD-10-CM

## 2020-09-29 DIAGNOSIS — J3089 Other allergic rhinitis: Secondary | ICD-10-CM

## 2020-09-29 DIAGNOSIS — K219 Gastro-esophageal reflux disease without esophagitis: Secondary | ICD-10-CM | POA: Diagnosis not present

## 2020-09-29 DIAGNOSIS — R399 Unspecified symptoms and signs involving the genitourinary system: Secondary | ICD-10-CM

## 2020-09-29 DIAGNOSIS — G8929 Other chronic pain: Secondary | ICD-10-CM

## 2020-09-29 DIAGNOSIS — R7303 Prediabetes: Secondary | ICD-10-CM | POA: Diagnosis not present

## 2020-09-29 DIAGNOSIS — M8949 Other hypertrophic osteoarthropathy, multiple sites: Secondary | ICD-10-CM

## 2020-09-29 DIAGNOSIS — M25562 Pain in left knee: Secondary | ICD-10-CM

## 2020-09-29 DIAGNOSIS — M542 Cervicalgia: Secondary | ICD-10-CM

## 2020-09-29 DIAGNOSIS — Z23 Encounter for immunization: Secondary | ICD-10-CM

## 2020-09-29 DIAGNOSIS — N182 Chronic kidney disease, stage 2 (mild): Secondary | ICD-10-CM

## 2020-09-29 DIAGNOSIS — Z6830 Body mass index (BMI) 30.0-30.9, adult: Secondary | ICD-10-CM

## 2020-09-29 DIAGNOSIS — E669 Obesity, unspecified: Secondary | ICD-10-CM

## 2020-09-29 LAB — POCT URINALYSIS DIPSTICK
Bilirubin, UA: NEGATIVE
Blood, UA: NEGATIVE
Glucose, UA: NEGATIVE
Ketones, UA: NEGATIVE
Leukocytes, UA: NEGATIVE
Nitrite, UA: NEGATIVE
Odor: NORMAL
Protein, UA: NEGATIVE
Spec Grav, UA: 1.01 (ref 1.010–1.025)
Urobilinogen, UA: NEGATIVE E.U./dL — AB
pH, UA: 5 (ref 5.0–8.0)

## 2020-09-29 MED ORDER — ZINC SULFATE 220 (50 ZN) MG PO CAPS: 220.0000 mg | ORAL_CAPSULE | Freq: Every day | ORAL | 3 refills | Status: DC

## 2020-09-29 MED ORDER — CLONIDINE HCL 0.1 MG PO TABS: 0.1000 mg | ORAL_TABLET | Freq: Every day | ORAL | 3 refills | Status: DC

## 2020-09-29 MED ORDER — HYDRALAZINE HCL 25 MG PO TABS: 25.0000 mg | ORAL_TABLET | Freq: Three times a day (TID) | ORAL | 3 refills | Status: DC

## 2020-09-29 MED ORDER — ALBUTEROL SULFATE HFA 108 (90 BASE) MCG/ACT IN AERS: INHALATION_SPRAY | RESPIRATORY_TRACT | 5 refills | Status: DC

## 2020-09-29 MED ORDER — MONTELUKAST SODIUM 10 MG PO TABS: 10.0000 mg | ORAL_TABLET | Freq: Every day | ORAL | 1 refills | Status: DC

## 2020-09-29 MED ORDER — FLUTICASONE PROPIONATE 50 MCG/ACT NA SUSP: NASAL | 3 refills | Status: DC

## 2020-09-29 MED ORDER — IBUPROFEN 800 MG PO TABS: 800.0000 mg | ORAL_TABLET | Freq: Three times a day (TID) | ORAL | 1 refills | Status: DC

## 2020-09-29 MED ORDER — SHINGRIX 50 MCG/0.5ML IM SUSR: 0.5000 mL | Freq: Once | INTRAMUSCULAR | 0 refills | Status: AC

## 2020-09-29 MED ORDER — AMILORIDE HCL 5 MG PO TABS: 5.0000 mg | ORAL_TABLET | Freq: Every day | ORAL | 1 refills | Status: DC

## 2020-09-29 MED ORDER — CARVEDILOL 12.5 MG PO TABS: 12.5000 mg | ORAL_TABLET | Freq: Two times a day (BID) | ORAL | 3 refills | Status: DC

## 2020-09-29 MED ORDER — DILTIAZEM HCL ER COATED BEADS 120 MG PO CP24: 120.0000 mg | ORAL_CAPSULE | Freq: Every morning | ORAL | 3 refills | Status: DC

## 2020-09-29 MED ORDER — ROSUVASTATIN CALCIUM 40 MG PO TABS: 40.0000 mg | ORAL_TABLET | Freq: Every day | ORAL | 3 refills | Status: DC

## 2020-09-29 MED ORDER — POTASSIUM CHLORIDE ER 10 MEQ PO TBCR: EXTENDED_RELEASE_TABLET | ORAL | 1 refills | Status: DC

## 2020-09-29 MED ORDER — FAMOTIDINE 40 MG PO TABS: 40.0000 mg | ORAL_TABLET | Freq: Two times a day (BID) | ORAL | 1 refills | Status: DC

## 2020-09-29 MED ORDER — OMEPRAZOLE 40 MG PO CPDR: 40.0000 mg | DELAYED_RELEASE_CAPSULE | Freq: Every day | ORAL | 1 refills | Status: DC

## 2020-09-29 MED ORDER — DILTIAZEM HCL ER COATED BEADS 240 MG PO CP24: 240.0000 mg | ORAL_CAPSULE | Freq: Every day | ORAL | 3 refills | Status: DC

## 2020-09-29 MED ORDER — PREDNISONE 20 MG PO TABS: ORAL_TABLET | ORAL | 0 refills | Status: DC

## 2020-09-29 MED ORDER — BACLOFEN 10 MG PO TABS: ORAL_TABLET | ORAL | 1 refills | Status: DC

## 2020-09-29 NOTE — Patient Instructions (Addendum)
Calorie Counting for Weight Loss Calories are units of energy. Your body needs a certain number of calories from food to keep going throughout the day. When you eat or drink more calories than your body needs, your body stores the extra calories mostly as fat. When you eat or drink fewer calories than your body needs, your body burns fat to get the energy it needs. Calorie counting means keeping track of how many calories you eat and drink each day. Calorie counting can be helpful if you need to lose weight. If you eat fewer calories than your body needs, you should lose weight. Ask your health care provider what a healthy weight is for you. For calorie counting to work, you will need to eat the right number of calories each day to lose a healthy amount of weight per week. A dietitian can help you figure out how many calories you need in a day and will suggest ways to reach your calorie goal. A healthy amount of weight to lose each week is usually 1-2 lb (0.5-0.9 kg). This usually means that your daily calorie intake should be reduced by 500-750 calories. Eating 1,200-1,500 calories a day can help most women lose weight. Eating 1,500-1,800 calories a day can help most men lose weight. What do I need to know about calorie counting? Work with your health care provider or dietitian to determine how many calories you should get each day. To meet your daily calorie goal, you will need to: Find out how many calories are in each food that you would like to eat. Try to do this before you eat. Decide how much of the food you plan to eat. Keep a food log. Do this by writing down what you ate and how many calories it had. To successfully lose weight, it is important to balance calorie counting with a healthy lifestyle that includes regular activity. Where do I find calorie information? The number of calories in a food can be found on a Nutrition Facts label. If a food does not have a Nutrition Facts label, try to  look up the calories online or ask your dietitian for help. Remember that calories are listed per serving. If you choose to have more than one serving of a food, you will have to multiply the calories per serving by the number of servings you plan to eat. For example, the label on a package of bread might say that a serving size is 1 slice and that there are 90 calories in a serving. If you eat 1 slice, you will have eaten 90 calories. If you eat 2 slices, you will have eaten 180 calories. How do I keep a food log? After each time that you eat, record the following in your food log as soon as possible: What you ate. Be sure to include toppings, sauces, and other extras on the food. How much you ate. This can be measured in cups, ounces, or number of items. How many calories were in each food and drink. The total number of calories in the food you ate. Keep your food log near you, such as in a pocket-sized notebook or on an app or website on your mobile phone. Some programs will calculate calories for you and show you how many calories you have left to meet your daily goal. What are some portion-control tips? Know how many calories are in a serving. This will help you know how many servings you can have of a certain food.   Use a measuring cup to measure serving sizes. You could also try weighing out portions on a kitchen scale. With time, you will be able to estimate serving sizes for some foods. Take time to put servings of different foods on your favorite plates or in your favorite bowls and cups so you know what a serving looks like. Try not to eat straight from a food's packaging, such as from a bag or box. Eating straight from the package makes it hard to see how much you are eating and can lead to overeating. Put the amount you would like to eat in a cup or on a plate to make sure you are eating the right portion. Use smaller plates, glasses, and bowls for smaller portions and to prevent  overeating. Try not to multitask. For example, avoid watching TV or using your computer while eating. If it is time to eat, sit down at a table and enjoy your food. This will help you recognize when you are full. It will also help you be more mindful of what and how much you are eating. What are tips for following this plan? Reading food labels Check the calorie count compared with the serving size. The serving size may be smaller than what you are used to eating. Check the source of the calories. Try to choose foods that are high in protein, fiber, and vitamins, and low in saturated fat, trans fat, and sodium. Shopping Read nutrition labels while you shop. This will help you make healthy decisions about which foods to buy. Pay attention to nutrition labels for low-fat or fat-free foods. These foods sometimes have the same number of calories or more calories than the full-fat versions. They also often have added sugar, starch, or salt to make up for flavor that was removed with the fat. Make a grocery list of lower-calorie foods and stick to it. Cooking Try to cook your favorite foods in a healthier way. For example, try baking instead of frying. Use low-fat dairy products. Meal planning Use more fruits and vegetables. One-half of your plate should be fruits and vegetables. Include lean proteins, such as chicken, turkey, and fish. Lifestyle Each week, aim to do one of the following: 150 minutes of moderate exercise, such as walking. 75 minutes of vigorous exercise, such as running. General information Know how many calories are in the foods you eat most often. This will help you calculate calorie counts faster. Find a way of tracking calories that works for you. Get creative. Try different apps or programs if writing down calories does not work for you. What foods should I eat?  Eat nutritious foods. It is better to have a nutritious, high-calorie food, such as an avocado, than a food with  few nutrients, such as a bag of potato chips. Use your calories on foods and drinks that will fill you up and will not leave you hungry soon after eating. Examples of foods that fill you up are nuts and nut butters, vegetables, lean proteins, and high-fiber foods such as whole grains. High-fiber foods are foods with more than 5 g of fiber per serving. Pay attention to calories in drinks. Low-calorie drinks include water and unsweetened drinks. The items listed above may not be a complete list of foods and beverages you can eat. Contact a dietitian for more information. What foods should I limit? Limit foods or drinks that are not good sources of vitamins, minerals, or protein or that are high in unhealthy fats. These include:   Candy. Other sweets. Sodas, specialty coffee drinks, alcohol, and juice. The items listed above may not be a complete list of foods and beverages you should avoid. Contact a dietitian for more information. How do I count calories when eating out? Pay attention to portions. Often, portions are much larger when eating out. Try these tips to keep portions smaller: Consider sharing a meal instead of getting your own. If you get your own meal, eat only half of it. Before you start eating, ask for a container and put half of your meal into it. When available, consider ordering smaller portions from the menu instead of full portions. Pay attention to your food and drink choices. Knowing the way food is cooked and what is included with the meal can help you eat fewer calories. If calories are listed on the menu, choose the lower-calorie options. Choose dishes that include vegetables, fruits, whole grains, low-fat dairy products, and lean proteins. Choose items that are boiled, broiled, grilled, or steamed. Avoid items that are buttered, battered, fried, or served with cream sauce. Items labeled as crispy are usually fried, unless stated otherwise. Choose water, low-fat milk,  unsweetened iced tea, or other drinks without added sugar. If you want an alcoholic beverage, choose a lower-calorie option, such as a glass of wine or light beer. Ask for dressings, sauces, and syrups on the side. These are usually high in calories, so you should limit the amount you eat. If you want a salad, choose a garden salad and ask for grilled meats. Avoid extra toppings such as bacon, cheese, or fried items. Ask for the dressing on the side, or ask for olive oil and vinegar or lemon to use as dressing. Estimate how many servings of a food you are given. Knowing serving sizes will help you be aware of how much food you are eating at restaurants. Where to find more information Centers for Disease Control and Prevention: www.cdc.gov [place] Department of Agriculture: myplate.gov Summary Calorie counting means keeping track of how many calories you eat and drink each day. If you eat fewer calories than your body needs, you should lose weight. A healthy amount of weight to lose per week is usually 1-2 lb (0.5-0.9 kg). This usually means reducing your daily calorie intake by 500-750 calories. The number of calories in a food can be found on a Nutrition Facts label. If a food does not have a Nutrition Facts label, try to look up the calories online or ask your dietitian for help. Use smaller plates, glasses, and bowls for smaller portions and to prevent overeating. Use your calories on foods and drinks that will fill you up and not leave you hungry shortly after a meal. This information is not intended to replace advice given to you by your health care provider. Make sure you discuss any questions you have with your health care provider. Document Revised: 03/15/2019 Document Reviewed: 03/15/2019 Elsevier Patient Education  2022 [employer] Inc.    Why follow it? Research shows. Those who follow the Mediterranean diet have a reduced risk of heart disease  The diet is associated with a reduced incidence  of Parkinson's and Alzheimer's diseases People following the diet may have longer life expectancies and lower rates of chronic diseases  The Dietary Guidelines for Americans recommends the Mediterranean diet as an eating plan to promote health and prevent disease  What Is the Mediterranean Diet?  Healthy eating plan based on typical foods and recipes of Mediterranean-style cooking The diet is primarily a   plant based diet; these foods should make up a majority of meals   Starches - Plant based foods should make up a majority of meals - They are an important sources of vitamins, minerals, energy, antioxidants, and fiber - Choose whole grains, foods high in fiber and minimally processed items  - Typical grain sources include wheat, oats, barley, corn, brown rice, bulgar, farro, millet, polenta, couscous  - Various types of beans include chickpeas, lentils, fava beans, black beans, white beans   Fruits  Veggies - Large quantities of antioxidant rich fruits & veggies; 6 or more servings  - Vegetables can be eaten raw or lightly drizzled with oil and cooked  - Vegetables common to the traditional Mediterranean Diet include: artichokes, arugula, beets, broccoli, brussel sprouts, cabbage, carrots, celery, collard greens, cucumbers, eggplant, kale, leeks, lemons, lettuce, mushrooms, okra, onions, peas, peppers, potatoes, pumpkin, radishes, rutabaga, shallots, spinach, sweet potatoes, turnips, zucchini - Fruits common to the Mediterranean Diet include: apples, apricots, avocados, cherries, clementines, dates, figs, grapefruits, grapes, melons, nectarines, oranges, peaches, pears, pomegranates, strawberries, tangerines  Fats - Replace butter and margarine with healthy oils, such as olive oil, canola oil, and tahini  - Limit nuts to no more than a handful a day  - Nuts include walnuts, almonds, pecans, pistachios, pine nuts  - Limit or avoid candied, honey roasted or heavily salted nuts - Olives are central  to the Mediterranean diet - can be eaten whole or used in a variety of dishes   Meats Protein - Limiting red meat: no more than a few times a month - When eating red meat: choose lean cuts and keep the portion to the size of deck of cards - Eggs: approx. 0 to 4 times a week  - Fish and lean poultry: at least 2 a week  - Healthy protein sources include, chicken, turkey, lean beef, lamb - Increase intake of seafood such as tuna, salmon, trout, mackerel, shrimp, scallops - Avoid or limit high fat processed meats such as sausage and bacon  Dairy - Include moderate amounts of low fat dairy products  - Focus on healthy dairy such as fat free yogurt, skim milk, low or reduced fat cheese - Limit dairy products higher in fat such as whole or 2% milk, cheese, ice cream  Alcohol - Moderate amounts of red wine is ok  - No more than 5 oz daily for women (all ages) and men older than age 65  - No more than 10 oz of wine daily for men younger than 65  Other - Limit sweets and other desserts  - Use herbs and spices instead of salt to flavor foods  - Herbs and spices common to the traditional Mediterranean Diet include: basil, bay leaves, chives, cloves, cumin, fennel, garlic, lavender, marjoram, mint, oregano, parsley, pepper, rosemary, sage, savory, sumac, tarragon, thyme   It's not just a diet, it's a lifestyle:  The Mediterranean diet includes lifestyle factors typical of those in the region  Foods, drinks and meals are best eaten with others and savored Daily physical activity is important for overall good health This could be strenuous exercise like running and aerobics This could also be more leisurely activities such as walking, housework, yard-work, or taking the stairs Moderation is the key; a balanced and healthy diet accommodates most foods and drinks Consider portion sizes and frequency of consumption of certain foods   Meal Ideas & Options:  Breakfast:  Whole wheat toast or whole wheat  English muffins with peanut   butter & hard boiled egg Steel cut oats topped with apples & cinnamon and skim milk  Fresh fruit: banana, strawberries, melon, berries, peaches  Smoothies: strawberries, bananas, greek yogurt, peanut butter Low fat greek yogurt with blueberries and granola  Egg white omelet with spinach and mushrooms Breakfast couscous: whole wheat couscous, apricots, skim milk, cranberries  Sandwiches:  Hummus and grilled vegetables (peppers, zucchini, squash) on whole wheat bread   Grilled chicken on whole wheat pita with lettuce, tomatoes, cucumbers or tzatziki  Tuna salad on whole wheat bread: tuna salad made with greek yogurt, olives, red peppers, capers, green onions Garlic rosemary lamb pita: lamb sauted with garlic, rosemary, salt & pepper; add lettuce, cucumber, greek yogurt to pita - flavor with lemon juice and black pepper  Seafood:  Mediterranean grilled salmon, seasoned with garlic, basil, parsley, lemon juice and black pepper Shrimp, lemon, and spinach whole-grain pasta salad made with low fat greek yogurt  Seared scallops with lemon orzo  Seared tuna steaks seasoned salt, pepper, coriander topped with tomato mixture of olives, tomatoes, olive oil, minced garlic, parsley, green onions and cappers  Meats:  Herbed greek chicken salad with kalamata olives, cucumber, feta  Red bell peppers stuffed with spinach, bulgur, lean ground beef (or lentils) & topped with feta   Kebabs: skewers of chicken, tomatoes, onions, zucchini, squash  Turkey burgers: made with red onions, mint, dill, lemon juice, feta cheese topped with roasted red peppers Vegetarian Cucumber salad: cucumbers, artichoke hearts, celery, red onion, feta cheese, tossed in olive oil & lemon juice  Hummus and whole grain pita points with a greek salad (lettuce, tomato, feta, olives, cucumbers, red onion) Lentil soup with celery, carrots made with vegetable broth, garlic, salt and pepper  Tabouli salad:  parsley, bulgur, mint, scallions, cucumbers, tomato, radishes, lemon juice, olive oil, salt and pepper.      

## 2020-09-29 NOTE — Progress Notes (Signed)
Provider: Marlowe Sax FNP-C   Gissele Narducci, Nelda Bucks, NP  Patient Care Team: Kallista Pae, Nelda Bucks, NP as PCP - General (Family Medicine) Richardo Priest, MD as Referring Physician (Cardiology) Jamal Maes, MD as Consulting Physician (Nephrology)  Extended Emergency Contact Information Primary Emergency Contact: Pandey,Sotero Address: [address]          [place] [address] Johnnette Litter of [place] Phone: [phone] Mobile Phone: [phone] Relation: Spouse Secondary Emergency Contact: [place] Mobile Phone: [phone] Relation: Daughter  Code Status:  Full Code  Goals of care: Advanced Directive information Advanced Directives 09/29/2020  Does Patient Have a Medical Advance Directive? No  Would patient like information on creating a medical advance directive? No - Patient declined     Chief Complaint  Patient presents with   Medical Management of Chronic Issues    5 Month follow up    Annual Exam    HPI:  Pt is a 65 y.o. female seen today for Annual Physical Exam.  States has itching with urination.follows up with Urologist tells had trace of blood in her urine and urologist wanted her to have urine specimen checked on her routine visit with PCP. She denies denies any fever,chills,nausea,vomiting,abdominal pain,flank pain,urgency,frequency or difficult urination.  Recently had a mammogram at [facility].No record for review.will obtain records.   Due for her shingrix vaccine and Pneumococcal vaccine.she declines her PNA vac here today states will get it at her pharmacy.   She would like all her medication to be send to pharmacy for 90 days supply.   Also due for a pap smear.she denies any abdominal pain,bloating,distension,vaginal discharge or bleeding   Past Medical History:  Diagnosis Date   Acute maxillary sinusitis 11/12/2015   Allergy    Anxiety state 05/17/2011   AR (allergic rhinitis) 02/17/2012   Asthma    BMI 31.0-31.9,adult  03/23/2011   Cough 03/23/2011   Dental disease 02/17/2012   [place] in [place] 01/2012    Essential hypertension, benign 03/23/2011   GERD (gastroesophageal reflux disease) 05/17/2011   H/O varicella    History of measles, mumps, or rubella    Hyperlipemia 03/23/2011   Hyperlipidemia    Hypertension    Hypertensive disorder 09/30/2016   Insomnia 05/17/2011   Mid back pain 02/17/2012   Neck pain 02/17/2012   Obesity 09/30/2016   Pelvic floor relaxation 09/30/2016   RAD (reactive airway disease) 02/17/2012   Past Surgical History:  Procedure Laterality Date   REPLACEMENT TOTAL KNEE Right    TOTAL KNEE ARTHROPLASTY Left 01/07/2020   Procedure: TOTAL KNEE ARTHROPLASTY;  Surgeon: Gaynelle Arabian, MD;  Location: [facility];  Service: Orthopedics;  Laterality: Left;  1mn   TUBAL LIGATION     VAGINAL HYSTERECTOMY     uterine prolapse; DUB; ovaries intact.  [place]    Allergies  Allergen Reactions   Ace Inhibitors Cough   Iodinated Diagnostic Agents Hives    Chills   Lisinopril Cough   Sulfa Antibiotics Hives   Red Dye    Tape     Surgical tape causes itching   Yellow Dye    Bactrim Rash   Flexeril [Cyclobenzaprine Hcl] Nausea Only   Latex Itching   Naprosyn [Naproxen] Nausea And Vomiting    And high doses of NSAIDS    Allergies as of 09/29/2020       Reactions   Ace Inhibitors Cough   Iodinated Diagnostic Agents Hives   Chills   Lisinopril Cough   Sulfa Antibiotics Hives  Red Dye    Tape    Surgical tape causes itching   Yellow Dye    Bactrim Rash   Flexeril [cyclobenzaprine Hcl] Nausea Only   Latex Itching   Naprosyn [naproxen] Nausea And Vomiting   And high doses of NSAIDS        Medication List        Accurate as of September 29, 2020  8:56 AM. If you have any questions, ask your nurse or doctor.          albuterol 108 (90 Base) MCG/ACT inhaler Commonly known as: Proventil HFA INHALE 2 PUFFS INTO THE LUNGS EVERY 6 HOURS AS NEEDED FOR WHEEZING OR SHORTNESS OF  BREATH   aMILoride 5 MG tablet Commonly known as: MIDAMOR Take 1 tablet (5 mg total) by mouth daily.   ascorbic acid 500 MG tablet Commonly known as: VITAMIN C Take 500 mg by mouth daily.   baclofen 10 MG tablet Commonly known as: LIORESAL Take 3 times daily as needed for muscle relaxant for neck   carvedilol 12.5 MG tablet Commonly known as: COREG Take 1 tablet (12.5 mg total) by mouth 2 (two) times daily.   cloNIDine 0.1 MG tablet Commonly known as: CATAPRES Take 1 tablet (0.1 mg total) by mouth at bedtime.   diazepam 10 MG tablet Commonly known as: VALIUM TAKE 1 TABLET BY MOUTH EVERY DAY AS NEEDED FOR MUSCLE SPASM   diltiazem 120 MG 24 hr capsule Commonly known as: CARDIZEM CD Take 1 capsule (120 mg total) by mouth every morning.   diltiazem 240 MG 24 hr capsule Commonly known as: CARDIZEM CD Take 1 capsule (240 mg total) by mouth at bedtime.   famotidine 40 MG tablet Commonly known as: PEPCID Take 1 tablet (40 mg total) by mouth 2 (two) times daily.   fluticasone 50 MCG/ACT nasal spray Commonly known as: FLONASE PLACE 2 SPRAYS IN THE NOSTRIL EVERY DAY   hydrALAZINE 25 MG tablet Commonly known as: APRESOLINE Take 1 tablet (25 mg total) by mouth 3 (three) times daily.   ibuprofen 800 MG tablet Commonly known as: ADVIL Take 1 tablet (800 mg total) by mouth in the morning, at noon, and at bedtime.   lactulose 10 GM/15ML solution Commonly known as: CHRONULAC Take 15 mLs (10 g total) by mouth 3 (three) times daily.   montelukast 10 MG tablet Commonly known as: SINGULAIR Take 1 tablet (10 mg total) by mouth at bedtime.   omeprazole 40 MG capsule Commonly known as: PRILOSEC Take 1 capsule (40 mg total) by mouth daily.   potassium chloride 10 MEQ tablet Commonly known as: Klor-Con 10 TAKE 3 TABLETS BY MOUTH EVERY DAY   predniSONE 20 MG tablet Commonly known as: DELTASONE Take 1 daily for 3 days for sore throat.   rosuvastatin 40 MG tablet Commonly  known as: CRESTOR Take 1 tablet (40 mg total) by mouth daily.   traMADol 50 MG tablet Commonly known as: ULTRAM TAKE 1 TABLET BY MOUTH EVERY 12 HOURS AS NEEDED FOR MODERATE PAIN   zinc sulfate 220 (50 Zn) MG capsule Take 1 capsule (220 mg total) by mouth daily.        Review of Systems  Constitutional:  Negative for appetite change, chills, fatigue, fever and unexpected weight change.  HENT:  Negative for congestion, dental problem, ear discharge, ear pain, facial swelling, hearing loss, nosebleeds, postnasal drip, rhinorrhea, sinus pressure, sinus pain, sneezing, sore throat, tinnitus and trouble swallowing.   Eyes:  Negative for pain, discharge,  redness, itching and visual disturbance.  Respiratory:  Negative for cough, chest tightness, shortness of breath and wheezing.   Cardiovascular:  Negative for chest pain, palpitations and leg swelling.  Gastrointestinal:  Negative for abdominal distention, abdominal pain, blood in stool, constipation, diarrhea, nausea and vomiting.  Endocrine: Negative for cold intolerance, heat intolerance, polydipsia, polyphagia and polyuria.  Genitourinary:  Negative for difficulty urinating, dysuria, flank pain, frequency and urgency.  Musculoskeletal:  Negative for arthralgias, back pain, gait problem, joint swelling, myalgias, neck pain and neck stiffness.  Skin:  Negative for color change, pallor, rash and wound.  Neurological:  Negative for dizziness, syncope, speech difficulty, weakness, light-headedness, numbness and headaches.  Hematological:  Does not bruise/bleed easily.  Psychiatric/Behavioral:  Negative for agitation, behavioral problems, confusion, hallucinations, self-injury, sleep disturbance and suicidal ideas. The patient is not nervous/anxious.    Immunization History  Administered Date(s) Administered   Influenza,inj,Quad PF,6+ Mos 12/14/2018, 10/18/2019   Influenza,inj,quad, With Preservative 11/16/2014, 11/16/2018    Influenza-Unspecified 11/13/2014, 12/17/2015, 12/16/2016   Moderna Sars-Covid-2 Vaccination 02/26/2019, 03/24/2019, 01/03/2020   Td 12/17/2006   Pertinent  Health Maintenance Due  Topic Date Due   PAP SMEAR-Modifier  11/14/2019   INFLUENZA VACCINE  09/15/2020   MAMMOGRAM  08/30/2021   COLONOSCOPY (Pts 45-22yr Insurance coverage will need to be confirmed)  03/29/2029   Fall Risk  09/29/2020 04/18/2020 03/14/2020 12/28/2019 12/13/2019  Falls in the past year? 0 0 0 0 0  Number falls in past yr: 0 0 0 0 0  Injury with Fall? 0 - 0 0 0  Risk for fall due to : No Fall Risks - - - -  Risk for fall due to: Comment - - - - -  Follow up Falls evaluation completed - Falls evaluation completed Falls evaluation completed Falls evaluation completed   Functional Status Survey:    Vitals:   09/29/20 0832  BP: 124/64  Pulse: 61  Resp: 16  Temp: 97.8 F (36.6 C)  SpO2: 98%  Weight: 149 lb 3.2 oz (67.7 kg)  Height: '4\' 11"'  (1.499 m)   Body mass index is 30.13 kg/m. Physical Exam Vitals reviewed.  Constitutional:      General: She is not in acute distress.    Appearance: Normal appearance. She is obese. She is not ill-appearing or diaphoretic.  HENT:     Head: Normocephalic.     Right Ear: Tympanic membrane, ear canal and external ear normal. There is no impacted cerumen.     Left Ear: Tympanic membrane, ear canal and external ear normal. There is no impacted cerumen.     Nose: Nose normal. No congestion or rhinorrhea.     Mouth/Throat:     Mouth: Mucous membranes are moist.     Pharynx: Oropharynx is clear. No oropharyngeal exudate or posterior oropharyngeal erythema.  Eyes:     General: No scleral icterus.       Right eye: No discharge.        Left eye: No discharge.     Extraocular Movements: Extraocular movements intact.     Conjunctiva/sclera: Conjunctivae normal.     Pupils: Pupils are equal, round, and reactive to light.  Neck:     Vascular: No carotid bruit.  Cardiovascular:      Rate and Rhythm: Normal rate and regular rhythm.     Pulses: Normal pulses.     Heart sounds: Normal heart sounds. No murmur heard.   No friction rub. No gallop.  Pulmonary:     Effort:  Pulmonary effort is normal. No respiratory distress.     Breath sounds: Normal breath sounds. No wheezing, rhonchi or rales.  Chest:     Chest wall: No tenderness.  Abdominal:     General: Bowel sounds are normal. There is no distension.     Palpations: Abdomen is soft. There is no mass.     Tenderness: There is no abdominal tenderness. There is no right CVA tenderness, left CVA tenderness, guarding or rebound.  Musculoskeletal:        General: No swelling or tenderness. Normal range of motion.     Cervical back: Normal range of motion. No rigidity or tenderness.     Right lower leg: No edema.     Left lower leg: No edema.     Comments: Bilateral knee high compression stockings in place    Lymphadenopathy:     Cervical: No cervical adenopathy.  Skin:    General: Skin is warm and dry.     Coloration: Skin is not pale.     Findings: No bruising, erythema, lesion or rash.  Neurological:     Mental Status: She is alert and oriented to person, place, and time.     Cranial Nerves: No cranial nerve deficit.     Motor: No weakness.     Coordination: Coordination normal.     Gait: Gait normal.  Psychiatric:        Mood and Affect: Mood normal.        Speech: Speech normal.        Behavior: Behavior normal.        Thought Content: Thought content normal.        Judgment: Judgment normal.    Labs reviewed: Recent Labs    12/25/19 1021 03/06/20 0856 05/16/20 0821  NA 141 140 145*  K 4.0 4.2 4.9  CL 112* 108* 109*  CO2 22 19* 20  GLUCOSE 107* 94 95  BUN '18 19 15  ' CREATININE 0.81 0.68 0.72  CALCIUM 9.4 9.3 9.2   Recent Labs    12/25/19 1021 03/06/20 0856  AST 15 12  ALT 11 13  ALKPHOS 90 87  BILITOT 0.3 <0.2  PROT 7.3 6.4  ALBUMIN 3.8 4.0   Recent Labs    12/25/19 1021  03/06/20 1028  WBC 5.8 8.9  HGB 12.4 11.6  HCT 40.9 37.5  MCV 87.6 84  PLT 283 242   Lab Results  Component Value Date   TSH 1.990 04/18/2018   Lab Results  Component Value Date   HGBA1C 5.4 03/06/2020   Lab Results  Component Value Date   CHOL 173 03/06/2020   HDL 81 03/06/2020   LDLCALC 75 03/06/2020   TRIG 95 03/06/2020   CHOLHDL 2.1 03/06/2020    Significant Diagnostic Results in last 30 days:  No results found.  Assessment/Plan  1. Essential hypertension B/p well under control. - continue on Amiloride, carvedilol,diltiazem,Hydralazine and Clonidine  - continue to follow up with Cardiologist  - carvedilol (COREG) 12.5 MG tablet; Take 1 tablet (12.5 mg total) by mouth 2 (two) times daily.  Dispense: 180 tablet; Refill: 3 - cloNIDine (CATAPRES) 0.1 MG tablet; Take 1 tablet (0.1 mg total) by mouth at bedtime.  Dispense: 90 tablet; Refill: 3 - diltiazem (CARDIZEM CD) 120 MG 24 hr capsule; Take 1 capsule (120 mg total) by mouth every morning.  Dispense: 90 capsule; Refill: 3 - diltiazem (CARDIZEM CD) 240 MG 24 hr capsule; Take 1 capsule (240 mg total) by mouth  at bedtime.  Dispense: 90 capsule; Refill: 3 - hydrALAZINE (APRESOLINE) 25 MG tablet; Take 1 tablet (25 mg total) by mouth 3 (three) times daily.  Dispense: 270 tablet; Refill: 3 - CBC with Differential/Platelet; Future - TSH; Future - CMP with eGFR([facility]); Future  2. Mixed hyperlipidemia Continue on rosuvastatin  - continue dietary modification and exercise  - rosuvastatin (CRESTOR) 40 MG tablet; Take 1 tablet (40 mg total) by mouth daily.  Dispense: 90 tablet; Refill: 3 - Lipid panel; Future  3. Gastroesophageal reflux disease without esophagitis Symptoms well controlled on current medication  - famotidine (PEPCID) 40 MG tablet; Take 1 tablet (40 mg total) by mouth 2 (two) times daily.  Dispense: 180 tablet; Refill: 1 - omeprazole (PRILOSEC) 40 MG capsule; Take 1 capsule (40 mg total) by mouth daily.   Dispense: 90 capsule; Refill: 1  4. Prediabetes Previous hyperglycemia. - Hemoglobin A1c; Future  5. CKD (chronic kidney disease), stage II Previous CR at baseline.labs drawn today.  6. Primary osteoarthritis involving multiple joints Continue on current pain regimen.  7. Body mass index (BMI) of 30.0-30.9 in adult BMI 30.13  - has lost 2 lbs over four months  - continue with dietary modification and exercise   8. Mild intermittent asthma without complication Symptoms stable. Continue on Albuterol and Montelukast  - albuterol (PROVENTIL HFA) 108 (90 Base) MCG/ACT inhaler; INHALE 2 PUFFS INTO THE LUNGS EVERY 6 HOURS AS NEEDED FOR WHEEZING OR SHORTNESS OF BREATH  Dispense: 6.7 each; Refill: 5 - montelukast (SINGULAIR) 10 MG tablet; Take 1 tablet (10 mg total) by mouth at bedtime.  Dispense: 90 tablet; Refill: 1  9. Chronic neck pain Chronic. - Continue on baclofen and valium  - baclofen (LIORESAL) 10 MG tablet; Take 3 times daily as needed for muscle relaxant for neck  Dispense: 90 each; Refill: 1  10. Non-seasonal allergic rhinitis, unspecified trigger Continue on Fluticasone  - on Prednisone is effective and ABX when symptoms worsen.   - fluticasone (FLONASE) 50 MCG/ACT nasal spray; PLACE 2 SPRAYS IN THE NOSTRIL EVERY DAY  Dispense: 48 g; Refill: 3  11. Symptoms of urinary tract infection Report trace of blood in the urine at the Urologist.she was advised to get U/A rechecked by PCP.No record for review reports burning with urination.  - encouraged to increase water intake to 6-8 glasses daily - Urine Culture - POC Urinalysis Dipstick  12. Encounter for general adult medical examination with abnormal findings Up to date with immunization except shingrix,PNA and COVID-19 booster vaccine. Declined PNA vac today states will get it at her pharmaacy.Advised to get Shigrix vaccine at the Pharmacy.script send today.  Medication and labs reviewed patient counselled regarding yearly  exam, diet, regular sustained exercise for at least 30 minutes x 3 /week,COVID-19 hand hygiene, mask and social distancing per CDC guidelines. Proper use of sun screen and protective clothing, recommended schedule for routine labs.  - CBC with Differential/Platelet; Future - TSH; Future - Lipid panel; Future - Hemoglobin A1c; Future - CMP with eGFR([facility]); Future  13. Need for zoster vaccination Advised to get shingrix vaccine at her pharmacy  - Zoster Vaccine Adjuvanted [facility]) injection; Inject 0.5 mLs into the muscle once for 1 dose.  Dispense: 0.5 mL; Refill: 0  14. Screening for cervical cancer No bleeding,discharge or tenderness.  - PAP, Image Guided [[facility]]  15. Pain in joint of left knee Continue current pain regimen   16. Obesity (BMI 30-39.9) BMI 30.13  - dietary modification and exercise.  Family/ staff Communication: Reviewed plan of care with patient verbalized understanding   Labs/tests ordered:  - CBC with Differential/Platelet - CMP with eGFR([facility]) - TSH - Hgb A1C - Lipid panel  Next Appointment : 6 months for medical management of chronic issues.Fasting Labs prior to visit.    Sandrea Hughs, NP

## 2020-09-30 ENCOUNTER — Other Ambulatory Visit: Payer: Self-pay

## 2020-09-30 LAB — PAP IG (IMAGE GUIDED)

## 2020-10-01 LAB — CBC WITH DIFFERENTIAL/PLATELET
Absolute Monocytes: 350 cells/uL (ref 200–950)
Basophils Absolute: 60 cells/uL (ref 0–200)
Basophils Relative: 1.3 %
Eosinophils Absolute: 198 cells/uL (ref 15–500)
Eosinophils Relative: 4.3 %
HCT: 40.7 % (ref 35.0–45.0)
Hemoglobin: 12.5 g/dL (ref 11.7–15.5)
Lymphs Abs: 1877 cells/uL (ref 850–3900)
MCH: 25.5 pg — ABNORMAL LOW (ref 27.0–33.0)
MCHC: 30.7 g/dL — ABNORMAL LOW (ref 32.0–36.0)
MCV: 83.1 fL (ref 80.0–100.0)
MPV: 10.6 fL (ref 7.5–12.5)
Monocytes Relative: 7.6 %
Neutro Abs: 2116 cells/uL (ref 1500–7800)
Neutrophils Relative %: 46 %
Platelets: 260 10*3/uL (ref 140–400)
RBC: 4.9 10*6/uL (ref 3.80–5.10)
RDW: 14 % (ref 11.0–15.0)
Total Lymphocyte: 40.8 %
WBC: 4.6 10*3/uL (ref 3.8–10.8)

## 2020-10-01 LAB — LIPID PANEL
Cholesterol: 126 mg/dL (ref <200)
HDL: 56 mg/dL (ref >=50)
LDL Cholesterol (Calc): 55 mg/dL (calc)
Non-HDL Cholesterol (Calc): 70 mg/dL (calc) (ref <130)
Total CHOL/HDL Ratio: 2.3 (calc) (ref <5.0)
Triglycerides: 74 mg/dL (ref <150)

## 2020-10-01 LAB — COMPLETE METABOLIC PANEL WITH GFR
AG Ratio: 1.5 (calc) (ref 1.0–2.5)
ALT: 18 U/L (ref 6–29)
AST: 18 U/L (ref 10–35)
Albumin: 3.8 g/dL (ref 3.6–5.1)
Alkaline phosphatase (APISO): 81 U/L (ref 37–153)
BUN: 15 mg/dL (ref 7–25)
CO2: 25 mmol/L (ref 20–32)
Calcium: 8.9 mg/dL (ref 8.6–10.4)
Chloride: 110 mmol/L (ref 98–110)
Creat: 0.72 mg/dL (ref 0.50–1.05)
Globulin: 2.5 g/dL (calc) (ref 1.9–3.7)
Glucose, Bld: 89 mg/dL (ref 65–99)
Potassium: 4.5 mmol/L (ref 3.5–5.3)
Sodium: 142 mmol/L (ref 135–146)
Total Bilirubin: 0.2 mg/dL (ref 0.2–1.2)
Total Protein: 6.3 g/dL (ref 6.1–8.1)
eGFR: 93 mL/min/{1.73_m2} (ref >=60)

## 2020-10-01 LAB — URINE CULTURE
MICRO NUMBER:: 12245933
Result:: NO GROWTH
SPECIMEN QUALITY:: ADEQUATE

## 2020-10-01 LAB — TSH: TSH: 1.83 mIU/L (ref 0.40–4.50)

## 2020-10-01 LAB — HEMOGLOBIN A1C
Hgb A1c MFr Bld: 5.5 % of total Hgb (ref <5.7)
Mean Plasma Glucose: 111 mg/dL
eAG (mmol/L): 6.2 mmol/L

## 2020-10-03 IMAGING — CT CT ABD-PELV W/O CM
2 of 4 series · 13 of 46 positions shown, 15 images · non-contrast
Comparison: Abdomen ultrasound 11/17/2009.
COMPARISON: Abdomen ultrasound 11/17/2009.

Addendum:
CLINICAL DATA: 62-year-old female with lower abdominal pain.

EXAM:
CT ABDOMEN AND PELVIS WITHOUT CONTRAST
TECHNIQUE: Multidetector CT imaging of the abdomen and pelvis was performed
following the standard protocol without IV contrast.

[Series 3: a/p w/o 5mm · axial · non-contrast · 0.80mm/px · z∈[+736,+1141]mm · 10 of 97 slices shown, 12 images]
[im 8/97  soft-tissue]
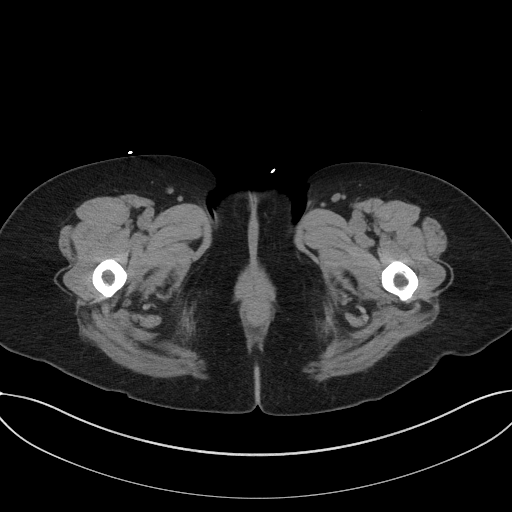
[im 8/97  bone]
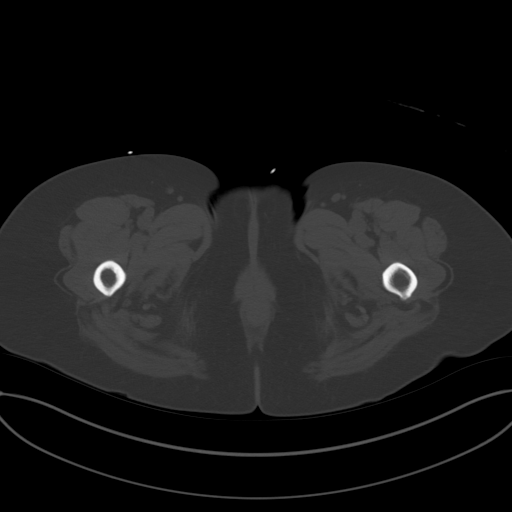
[im 16/97  soft-tissue]
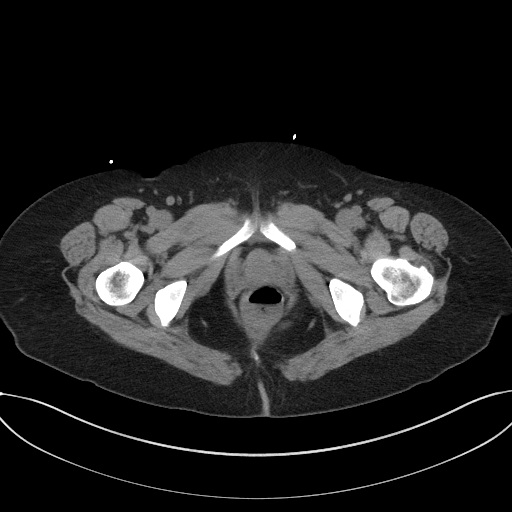
[im 27/97  soft-tissue]
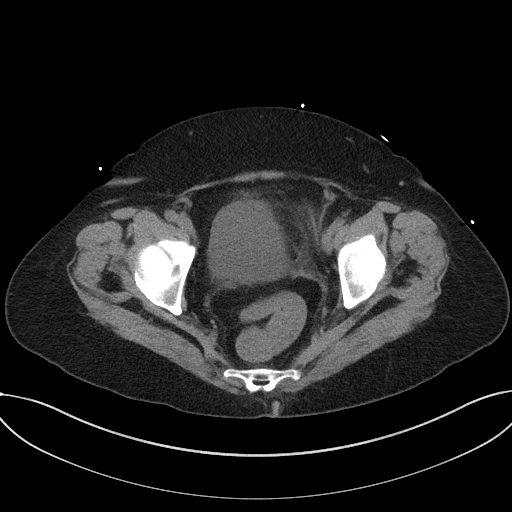
[im 35/97  soft-tissue]
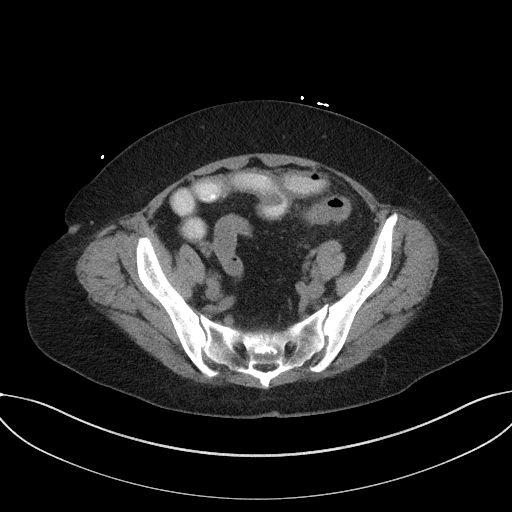
[im 43/97  soft-tissue]
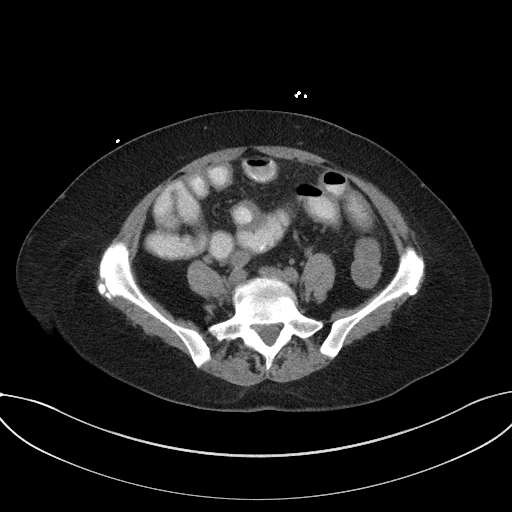
[im 54/97  soft-tissue]
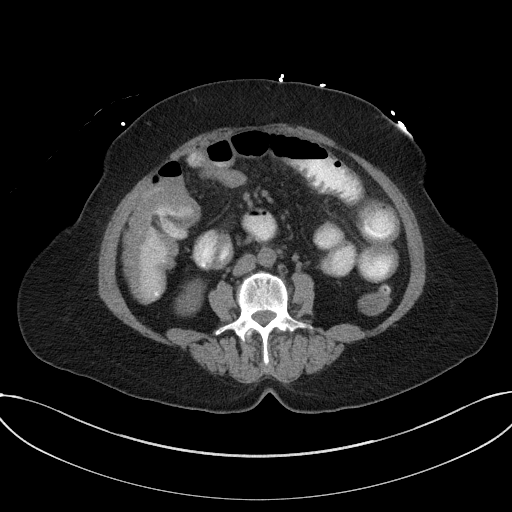
[im 62/97  soft-tissue]
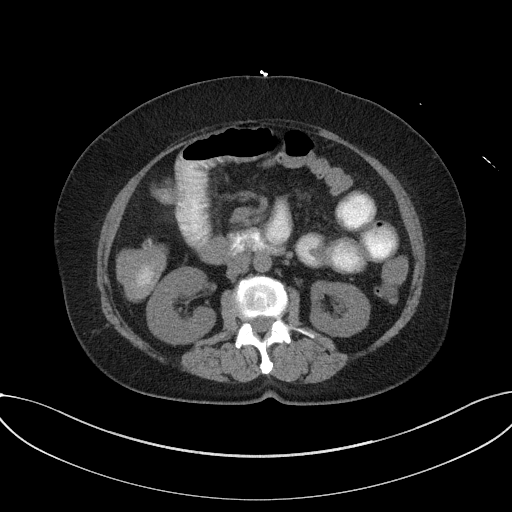
[im 73/97  soft-tissue]
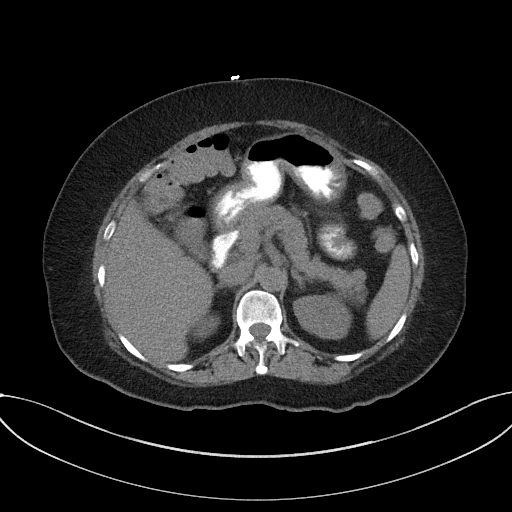
[im 81/97  soft-tissue]
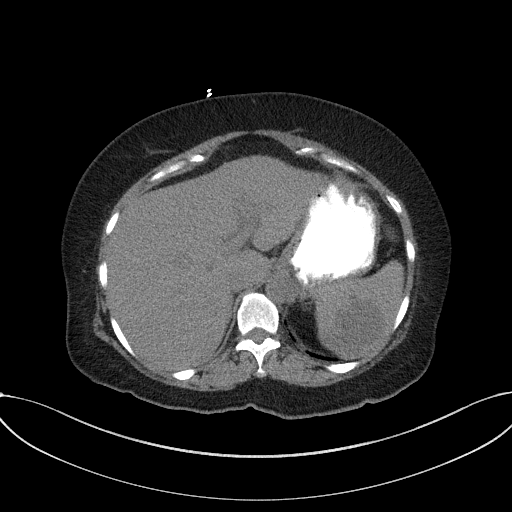
[im 81/97  bone]
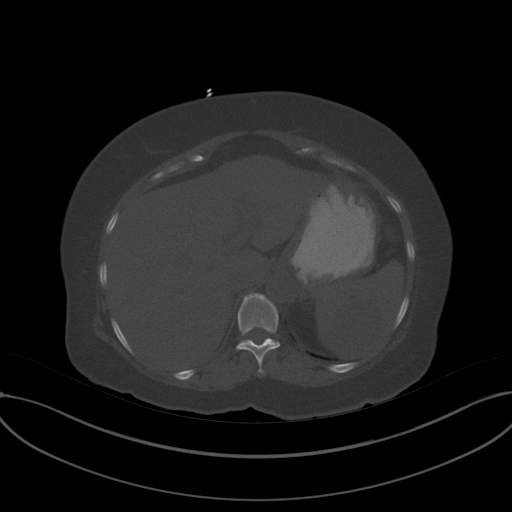
[im 89/97  soft-tissue]
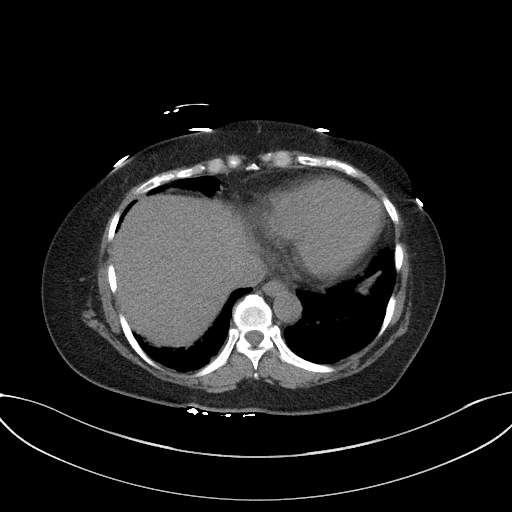

[Series 6: a/p w/o cor · coronal · non-contrast · 0.95mm/px · 3 of 151 slices shown]
[im 51/151  soft-tissue]
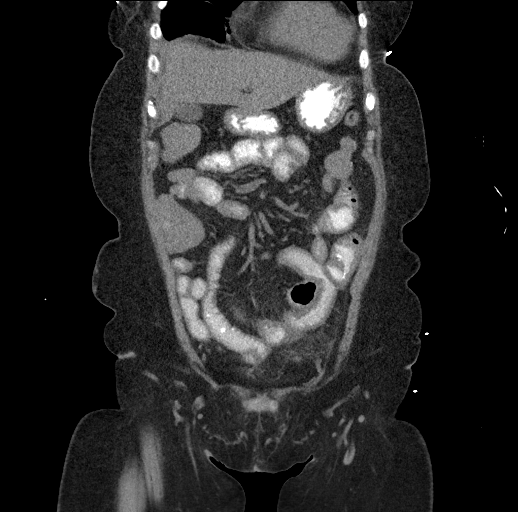
[im 67/151  soft-tissue]
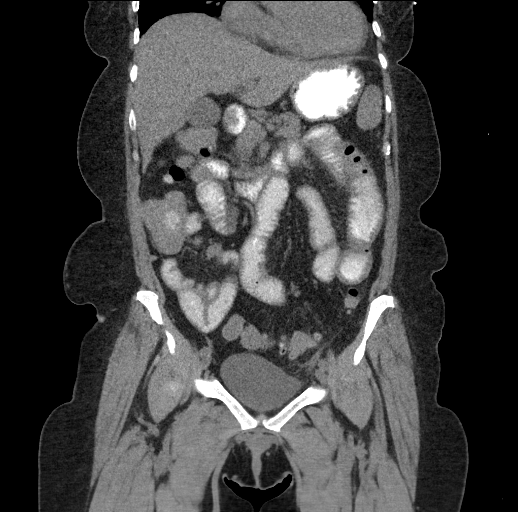
[im 84/151  soft-tissue]
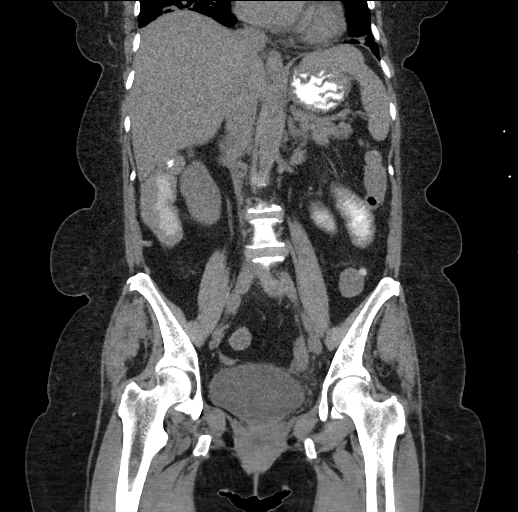

[13 of 46 positions shown; findings below may reference images not displayed]

FINDINGS: Lower chest: Mild cardiomegaly. No pericardial effusion.

Right middle lobe bronchiectasis. Bilateral lower lobe streaky
peribronchial opacity which most resembles atelectasis or scarring.
No pleural effusion.

Hepatobiliary: Trace adjacent pneumoperitoneum. Negative noncontrast
liver and gallbladder.

Pancreas: Negative.

Spleen: Chronic splenic cysts which were demonstrated by ultrasound
in 5244, benign. Simple fluid density on CT today.

Adrenals/Urinary Tract: Negative adrenal glands. Negative
noncontrast kidneys. No dilated ureters. There is inflammation along
the distal course of the left ureter as below.

Stomach/Bowel: Fluid-filled rectum and distal sigmoid colon.

Confluent inflammation affecting the proximal sigmoid in an area of
about 5 centimeters with underlying diverticulosis. Confluent
mesenteric stranding (series 3, image 66 and coronal image 58). And
thickening of the adjacent left lateral pelvic fossa. Furthermore,
there is at least trace extraluminal gas demonstrated on series 3,
image 68 and coronal image 57, plus a nearby 15 millimeter gas and
intermediate density area which might also be a small contained
perforation (coronal image 55).

And additionally, there is trace pneumoperitoneum along the anterior
superior liver seen on series 3, image 12 and coronal image 45. No
free fluid. Fluid throughout the upstream descending colon which
also demonstrates diverticulosis. Fluid in the transverse and right
colon also with diverticula.

Oral contrast was also administered and has just reached the cecum.
Upper limits of normal appendix suspected on series 3, image 51.
Some of the small bowel in the lower abdomen also appears mildly
inflamed (series 3, image 62). And small bowel is mildly dilated
throughout the abdomen.

The stomach and duodenum have a more normal appearance.

Vascular/Lymphatic: Vascular patency is not evaluated in the absence
of IV contrast. Minimal calcified aortic atherosclerosis. No
lymphadenopathy.

Reproductive: Surgically absent uterus. Diminutive or absent
ovaries.

Other: No pelvic free fluid.

Musculoskeletal: No acute osseous abnormality identified.
IMPRESSION: 1. Perforated acute diverticulitis of the proximal sigmoid colon.
There is both a small area of contained perforation along the
anterior inferior sigmoid mesentery (series 3, image 68), but also
trace pneumoperitoneum over the liver.
2. Secondary appearing inflammation of distal small bowel, with a
mild generalized ileus or bowel obstruction.
3. Non-acute findings:
Mild cardiomegaly.
Right middle lobe bronchiectasis and bilateral lung base atelectasis
or scarring.
Chronic benign splenic cysts.

ADDENDUM:
Study discussed by telephone with PA MONARD ROBERT MALARY on 12/01/2018 at 7372
hours.

*** End of Addendum ***
FINDINGS: Lower chest: Mild cardiomegaly. No pericardial effusion.

Right middle lobe bronchiectasis. Bilateral lower lobe streaky
peribronchial opacity which most resembles atelectasis or scarring.
No pleural effusion.

Hepatobiliary: Trace adjacent pneumoperitoneum. Negative noncontrast
liver and gallbladder.

Pancreas: Negative.

Spleen: Chronic splenic cysts which were demonstrated by ultrasound
in 5244, benign. Simple fluid density on CT today.

Adrenals/Urinary Tract: Negative adrenal glands. Negative
noncontrast kidneys. No dilated ureters. There is inflammation along
the distal course of the left ureter as below.

Stomach/Bowel: Fluid-filled rectum and distal sigmoid colon.

Confluent inflammation affecting the proximal sigmoid in an area of
about 5 centimeters with underlying diverticulosis. Confluent
mesenteric stranding (series 3, image 66 and coronal image 58). And
thickening of the adjacent left lateral pelvic fossa. Furthermore,
there is at least trace extraluminal gas demonstrated on series 3,
image 68 and coronal image 57, plus a nearby 15 millimeter gas and
intermediate density area which might also be a small contained
perforation (coronal image 55).

And additionally, there is trace pneumoperitoneum along the anterior
superior liver seen on series 3, image 12 and coronal image 45. No
free fluid. Fluid throughout the upstream descending colon which
also demonstrates diverticulosis. Fluid in the transverse and right
colon also with diverticula.

Oral contrast was also administered and has just reached the cecum.
Upper limits of normal appendix suspected on series 3, image 51.
Some of the small bowel in the lower abdomen also appears mildly
inflamed (series 3, image 62). And small bowel is mildly dilated
throughout the abdomen.

The stomach and duodenum have a more normal appearance.

Vascular/Lymphatic: Vascular patency is not evaluated in the absence
of IV contrast. Minimal calcified aortic atherosclerosis. No
lymphadenopathy.

Reproductive: Surgically absent uterus. Diminutive or absent
ovaries.

Other: No pelvic free fluid.

Musculoskeletal: No acute osseous abnormality identified.
IMPRESSION: 1. Perforated acute diverticulitis of the proximal sigmoid colon.
There is both a small area of contained perforation along the
anterior inferior sigmoid mesentery (series 3, image 68), but also
trace pneumoperitoneum over the liver.
2. Secondary appearing inflammation of distal small bowel, with a
mild generalized ileus or bowel obstruction.
3. Non-acute findings:
Mild cardiomegaly.
Right middle lobe bronchiectasis and bilateral lung base atelectasis
or scarring.
Chronic benign splenic cysts.

## 2020-10-26 ENCOUNTER — Other Ambulatory Visit: Payer: Self-pay | Admitting: Family

## 2020-10-26 DIAGNOSIS — G8929 Other chronic pain: Secondary | ICD-10-CM

## 2020-10-26 DIAGNOSIS — M542 Cervicalgia: Secondary | ICD-10-CM

## 2020-11-14 ENCOUNTER — Ambulatory Visit: Payer: Federal, State, Local not specified - PPO | Admitting: Cardiology

## 2020-11-21 ENCOUNTER — Telehealth: Payer: Self-pay | Admitting: Cardiology

## 2020-11-21 NOTE — Telephone Encounter (Signed)
  Pt said she needs to get lab work done before her appt with Dr. Dulce Sellar. No order on file

## 2020-11-21 NOTE — Telephone Encounter (Signed)
Reviewed chart and pt just had full workup with PCP in August does not appear to need further labs . Pt aware .Zack Seal

## 2020-11-27 ENCOUNTER — Encounter: Payer: Self-pay | Admitting: Family

## 2020-11-27 ENCOUNTER — Other Ambulatory Visit: Payer: Self-pay

## 2020-11-27 ENCOUNTER — Encounter: Payer: Self-pay | Admitting: Cardiology

## 2020-11-27 ENCOUNTER — Ambulatory Visit: Payer: Federal, State, Local not specified - PPO | Admitting: Family

## 2020-11-27 ENCOUNTER — Ambulatory Visit: Payer: Federal, State, Local not specified - PPO | Admitting: Cardiology

## 2020-11-27 VITALS — BP 110/68 | HR 76 | Ht 59.0 in | Wt 158.0 lb

## 2020-11-27 VITALS — BP 130/90 | HR 72 | Temp 97.3°F | Resp 16 | Ht 59.0 in | Wt 157.0 lb

## 2020-11-27 DIAGNOSIS — R21 Rash and other nonspecific skin eruption: Secondary | ICD-10-CM | POA: Diagnosis not present

## 2020-11-27 DIAGNOSIS — E782 Mixed hyperlipidemia: Secondary | ICD-10-CM

## 2020-11-27 DIAGNOSIS — S90822A Blister (nonthermal), left foot, initial encounter: Secondary | ICD-10-CM | POA: Diagnosis not present

## 2020-11-27 DIAGNOSIS — I10 Essential (primary) hypertension: Secondary | ICD-10-CM | POA: Diagnosis not present

## 2020-11-27 DIAGNOSIS — G729 Myopathy, unspecified: Secondary | ICD-10-CM

## 2020-11-27 DIAGNOSIS — Z96653 Presence of artificial knee joint, bilateral: Secondary | ICD-10-CM | POA: Diagnosis not present

## 2020-11-27 NOTE — Patient Instructions (Addendum)
Medication Instructions:  STOP: Rosuvastatin *If you need a refill on your cardiac medications before your next appointment, please call your pharmacy*   Lab Work: Your physician recommends that you return for lab work in: 6 weeks once you are off the statin   Lipids, CMP If you have labs (blood work) drawn today and your tests are completely normal, you will receive your results only by: MyChart Message (if you have MyChart) OR A paper copy in the mail If you have any lab test that is abnormal or we need to change your treatment, we will call you to review the results.   Testing/Procedures: None   Follow-Up: At [facility], you and your health needs are our priority.  As part of our continuing mission to provide you with exceptional heart care, we have created designated Provider Care Teams.  These Care Teams include your primary Cardiologist (physician) and Advanced Practice Providers (APPs -  Physician Assistants and Nurse Practitioners) who all work together to provide you with the care you need, when you need it.  We recommend signing up for the patient portal called "MyChart".  Sign up information is provided on this After Visit Summary.  MyChart is used to connect with patients for Virtual Visits (Telemedicine).  Patients are able to view lab/test results, encounter notes, upcoming appointments, etc.  Non-urgent messages can be sent to your provider as well.   To learn more about what you can do with MyChart, go to [email or web address].    Your next appointment:   6 month(s)  The format for your next appointment:   In Person  Provider:   Norman Herrlich, MD   Other Instructions

## 2020-11-27 NOTE — Patient Instructions (Signed)
-   keep left heel blister clean,cover with foam dressing to protect  - Notify provide for any signs of infection.

## 2020-11-27 NOTE — Progress Notes (Signed)
Provider: Richarda Blade FNP-C  Jaikob Borgwardt, Donalee Citrin, NP  Patient Care Team: Jemal Miskell, Donalee Citrin, NP as PCP - General (Family Medicine) Baldo Daub, MD as Referring Physician (Cardiology) Camille Bal, MD as Consulting Physician (Nephrology)  Extended Emergency Contact Information Primary Emergency Contact: Colantuono,Sotero Address: [address]          [place] [address] [place] Home Phone: [phone] Mobile Phone: [phone] Relation: Spouse Secondary Emergency Contact: Stringfield,Sagi Mobile Phone: [phone] Relation: Daughter  Code Status:  Full Code  Goals of care: Advanced Directive information Advanced Directives 09/29/2020  Does Patient Have a Medical Advance Directive? No  Would patient like information on creating a medical advance directive? No - Patient declined     Chief Complaint  Patient presents with   Acute Visit    Patient complains of blisters on left heel.     HPI:  Pt is a 65 y.o. female seen today for an acute visit for evaluation of left heel blister x 1 week.Started while in the gym.Has not wore any tight shoes but threw away her old sneakers.Has been exercising on Bike and sit up machine not sure if it caused any pressure.  Also had some rash on the left buttock  area which started one week ago.felt like dry and itching.Has been applying Triamcinolone cream at home.also had some valtrex tablets she brought from [place] so she took thinking it's shingles.she denies any redness,pain,burning or vesicles on the rash.   Denies any fever,chills or body aches .   Past Medical History:  Diagnosis Date   Acute maxillary sinusitis 11/12/2015   Allergy    Anxiety state 05/17/2011   AR (allergic rhinitis) 02/17/2012   Asthma    BMI 31.0-31.9,adult 03/23/2011   Cough 03/23/2011   Dental disease 02/17/2012   [place] 01[place]    Essential hypertension, benign 03/23/2011   GERD (gastroesophageal reflux disease)  05/17/2011   H/O varicella    History of measles, mumps, or rubella    Hyperlipemia 03/23/2011   Hyperlipidemia    Hypertension    Hypertensive disorder 09/30/2016   Insomnia 05/17/2011   Mid back pain 02/17/2012   Neck pain 02/17/2012   Obesity 09/30/2016   Pelvic floor relaxation 09/30/2016   RAD (reactive airway disease) 02/17/2012   Past Surgical History:  Procedure Laterality Date   REPLACEMENT TOTAL KNEE Right    TOTAL KNEE ARTHROPLASTY Left 01/07/2020   Procedure: TOTAL KNEE ARTHROPLASTY;  Surgeon: Ollen Gross, MD;  Location: [facility];  Service: Orthopedics;  Laterality: Left;    TUBAL LIGATION     VAGINAL HYSTERECTOMY     uterine prolapse; DUB; ovaries intact.  [facility]good    Allergies  Allergen Reactions   Ace Inhibitors Cough   Iodinated Diagnostic Agents Hives    Chills   Lisinopril Cough   Sulfa Antibiotics Hives   Red Dye    Tape     Surgical tape causes itching   Yellow Dye    Bactrim Rash   Flexeril [Cyclobenzaprine Hcl] Nausea Only   Latex Itching   Naprosyn [Naproxen] Nausea And Vomiting    And high doses of NSAIDS    Outpatient Encounter Medications as of 11/27/2020  Medication Sig   albuterol (PROVENTIL HFA) 108 (90 Base) MCG/ACT inhaler INHALE 2 PUFFS INTO THE LUNGS EVERY 6 HOURS AS NEEDED FOR WHEEZING OR SHORTNESS OF BREATH   aMILoride (MIDAMOR) 5 MG tablet Take 1 tablet (5 mg total) by mouth daily.  ascorbic acid (VITAMIN C) 500 MG tablet Take 500 mg by mouth daily.   baclofen (LIORESAL) 10 MG tablet Take 3 times daily as needed for muscle relaxant for neck   carvedilol (COREG) 12.5 MG tablet Take 1 tablet (12.5 mg total) by mouth 2 (two) times daily.   cloNIDine (CATAPRES) 0.1 MG tablet Take 1 tablet (0.1 mg total) by mouth at bedtime.   diazepam (VALIUM) 10 MG tablet TAKE 1 TABLET BY MOUTH EVERY DAY AS NEEDED FOR MUSCLE SPASM   diltiazem (CARDIZEM CD) 120 MG 24 hr capsule Take 1 capsule (120 mg total) by mouth every morning.   diltiazem (CARDIZEM CD)  240 MG 24 hr capsule Take 1 capsule (240 mg total) by mouth at bedtime.   famotidine (PEPCID) 40 MG tablet Take 1 tablet (40 mg total) by mouth 2 (two) times daily.   fluticasone (FLONASE) 50 MCG/ACT nasal spray PLACE 2 SPRAYS IN THE NOSTRIL EVERY DAY   hydrALAZINE (APRESOLINE) 25 MG tablet Take 1 tablet (25 mg total) by mouth 3 (three) times daily.   ibuprofen (ADVIL) 800 MG tablet Take 1 tablet (800 mg total) by mouth in the morning, at noon, and at bedtime.   lactulose (CHRONULAC) 10 GM/15ML solution Take 15 mLs (10 g total) by mouth 3 (three) times daily.   montelukast (SINGULAIR) 10 MG tablet Take 1 tablet (10 mg total) by mouth at bedtime.   omeprazole (PRILOSEC) 40 MG capsule Take 1 capsule (40 mg total) by mouth daily.   potassium chloride (KLOR-CON 10) 10 MEQ tablet TAKE 3 TABLETS BY MOUTH EVERY DAY   predniSONE (DELTASONE) 20 MG tablet Take 1 daily for 3 days for sore throat.   rosuvastatin (CRESTOR) 40 MG tablet Take 1 tablet (40 mg total) by mouth daily.   traMADol (ULTRAM) 50 MG tablet TAKE 1 TABLET BY MOUTH EVERY 12 HOURS AS NEEDED FOR MODERATE PAIN   zinc sulfate 220 (50 Zn) MG capsule Take 1 capsule (220 mg total) by mouth daily.   No facility-administered encounter medications on file as of 11/27/2020.    Review of Systems  Constitutional:  Negative for appetite change, chills, fatigue, fever and unexpected weight change.  Respiratory:  Negative for cough, chest tightness, shortness of breath and wheezing.   Cardiovascular:  Negative for chest pain, palpitations and leg swelling.  Gastrointestinal:  Negative for abdominal distention, abdominal pain, constipation, nausea and vomiting.  Musculoskeletal:  Negative for arthralgias, back pain, gait problem, joint swelling and myalgias.  Skin:  Negative for color change, pallor and rash.       Left heel blister  Rash on left buttock   Neurological:  Negative for dizziness, weakness, light-headedness, numbness and headaches.    Immunization History  Administered Date(s) Administered   Influenza,inj,Quad PF,6+ Mos 12/14/2018, 10/18/2019   Influenza,inj,quad, With Preservative 11/16/2014, 11/16/2018   Influenza-Unspecified 11/13/2014, 12/17/2015, 12/16/2016   Moderna Sars-Covid-2 Vaccination 02/26/2019, 03/24/2019, 01/03/2020   Td 12/17/2006   Pertinent  Health Maintenance Due  Topic Date Due   PAP SMEAR-Modifier  11/14/2019   INFLUENZA VACCINE  09/15/2020   MAMMOGRAM  08/30/2021   COLONOSCOPY (Pts 45-65yrs Insurance coverage will need to be confirmed)  03/29/2029   Fall Risk  09/29/2020 04/18/2020 03/14/2020 12/28/2019 12/13/2019  Falls in the past year? 0 0 0 0 0  Number falls in past yr: 0 0 0 0 0  Injury with Fall? 0 - 0 0 0  Risk for fall due to : No Fall Risks - - - -  Risk for fall due to: Comment - - - - -  Follow up Falls evaluation completed - Falls evaluation completed Falls evaluation completed Falls evaluation completed   Functional Status Survey:    Vitals:   11/27/20 1041  Height: 4\' 11"  (1.499 m)   Body mass index is 30.13 kg/m. Physical Exam Vitals reviewed.  Constitutional:      General: She is not in acute distress.    Appearance: Normal appearance. She is normal weight. She is not ill-appearing or diaphoretic.  HENT:     Head: Normocephalic.     Mouth/Throat:     Mouth: Mucous membranes are moist.     Pharynx: Oropharynx is clear. No oropharyngeal exudate or posterior oropharyngeal erythema.  Eyes:     General: No scleral icterus.       Right eye: No discharge.        Left eye: No discharge.     Conjunctiva/sclera: Conjunctivae normal.     Pupils: Pupils are equal, round, and reactive to light.  Cardiovascular:     Rate and Rhythm: Normal rate and regular rhythm.     Pulses: Normal pulses.     Heart sounds: Normal heart sounds. No murmur heard.   No friction rub. No gallop.  Pulmonary:     Effort: Pulmonary effort is normal. No respiratory distress.     Breath sounds:  Normal breath sounds. No wheezing, rhonchi or rales.  Chest:     Chest wall: No tenderness.  Abdominal:     Palpations: Abdomen is soft.  Musculoskeletal:        General: No swelling or tenderness. Normal range of motion.     Right lower leg: No edema.     Left lower leg: No edema.  Skin:    General: Skin is warm and dry.     Coloration: Skin is not pale.     Findings: No bruising, erythema or lesion.     Comments: Left lateral heel blood filled blister non-tender to touch and without any erythema.   Left posterior upper thigh area with x 4 scab rash non-tender to touch and without any erythema.No vesicles noted.   Neurological:     Mental Status: She is alert and oriented to person, place, and time.     Motor: No weakness.     Gait: Gait normal.  Psychiatric:        Speech: Speech normal.    Labs reviewed: Recent Labs    03/06/20 0856 05/16/20 0821 09/29/20 0807  NA 140 145* 142  K 4.2 4.9 4.5  CL 108* 109* 110  CO2 19* 20 25  GLUCOSE 94 95 89  BUN 19 15 15   CREATININE 0.68 0.72 0.72  CALCIUM 9.3 9.2 8.9   Recent Labs    12/25/19 1021 03/06/20 0856 09/29/20 0807  AST 15 12 18   ALT 11 13 18   ALKPHOS 90 87  --   BILITOT 0.3 <0.2 0.2  PROT 7.3 6.4 6.3  ALBUMIN 3.8 4.0  --    Recent Labs    12/25/19 1021 03/06/20 1028 09/29/20 0807  WBC 5.8 8.9 4.6  NEUTROABS  --   --  2,116  HGB 12.4 11.6 12.5  HCT 40.9 37.5 40.7  MCV 87.6 84 83.1  PLT 283 242 260   Lab Results  Component Value Date   TSH 1.83 09/29/2020   Lab Results  Component Value Date   HGBA1C 5.5 09/29/2020   Lab Results  Component Value Date  CHOL 126 09/29/2020   HDL 56 09/29/2020   LDLCALC 55 09/29/2020   TRIG 74 09/29/2020   CHOLHDL 2.3 09/29/2020    Significant Diagnostic Results in last 30 days:  No results found.  Assessment/Plan 1. Blister of left heel, initial encounter Afebrile.suspect possible from pressure from shoes or exercise machine.  Left lateral heel blood  filled blister non-tender to touch and without any erythema.  - avoid any tight fitting shoes  - keep left heel blister clean,cover with foam dressing to protect  - Notify provide for any signs of infection.    2. Rash and nonspecific skin eruption Afebrile  Left posterior upper thigh area with x 4 scab rash non-tender to touch and without any erythema.No vesicles noted.  - continue with Triamcinolone cream twice daily  - Notify provider if symptoms worsen or failed to resolve   Family/ staff Communication: Reviewed plan of care with patient  Labs/tests ordered: None   Next Appointment:   Caesar Bookman, NP

## 2020-11-27 NOTE — Progress Notes (Signed)
Cardiology Office Note:    Date:  11/27/2020   ID:  Brooke Wilson, DOB Aug 15, 1955, MRN [id number]  PCP:  Caesar Bookman, NP  Cardiologist:  Norman Herrlich, MD    Referring MD: Caesar Bookman, NP    ASSESSMENT:    1. Resistant hypertension   2. Mixed hyperlipidemia   3. Myopathy    PLAN:    In order of problems listed above:  Marked improvement since she started distal diuretic I offered to decrease her calcium channel blocker she declined but I told her if she continues to have hypotension to skip the 120 mg tablet of Cardizem CD.  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  Concerns regarding medicines are outlined above.  No orders of the defined types were placed in this encounter.  No orders of the defined types were placed in this encounter.   Chief Complaint  Patient presents with   Follow-up   Hypertension    History of Present Illness:    Brooke Wilson is a 65 y.o. female with a hx of resistant hypertension and hyperlipidemia last seen 05/16/2020.  She had been seen by nephrology was given an MRA that improved both hypokalemia and blood pressure control and with peripheral edema her calcium channel blocker dosage was decreased.  She was continued on other medications including hydralazine and centrally active clonidine and beta-blocker. 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 Her blood pressure is very well controlled she is done well with the distal diuretic and her most recent labs done 09/29/2020 shows a creatinine 0.72 potassium 4.5 hemoglobin 12.5 cholesterol 126 LDL 55 triglycerides 74 HDL 56 all good numbers  Past Medical History:  Diagnosis Date   Acute maxillary sinusitis 11/12/2015   Allergy    Anxiety state 05/17/2011   AR (allergic rhinitis) 02/17/2012   Asthma    BMI 31.0-31.9,adult 03/23/2011   Cough 03/23/2011   Dental disease 02/17/2012   [place] in [place] 01/2012    Essential hypertension, benign 03/23/2011   GERD (gastroesophageal reflux disease) 05/17/2011   H/O varicella    History of measles, mumps, or rubella    Hyperlipemia 03/23/2011   Hyperlipidemia    Hypertension    Hypertensive disorder 09/30/2016   Insomnia 05/17/2011   Mid back pain 02/17/2012   Neck pain 02/17/2012   Obesity 09/30/2016   Pelvic floor relaxation 09/30/2016   RAD (reactive airway disease) 02/17/2012    Past Surgical History:  Procedure Laterality Date   REPLACEMENT TOTAL KNEE Right    TOTAL KNEE ARTHROPLASTY Left 01/07/2020   Procedure: TOTAL KNEE ARTHROPLASTY;  Surgeon: Ollen Gross, MD;  Location: [facility];  Service: Orthopedics;  Laterality: Left;    TUBAL LIGATION     VAGINAL HYSTERECTOMY     uterine prolapse; DUB; ovaries intact.  [place]    Current Medications: Current Meds  Medication Sig   albuterol (PROVENTIL HFA) 108 (90 Base) MCG/ACT inhaler INHALE 2 PUFFS INTO  THE LUNGS EVERY 6 HOURS AS NEEDED FOR WHEEZING OR SHORTNESS OF BREATH   aMILoride (MIDAMOR) 5 MG tablet Take 1 tablet (5 mg total) by mouth daily.   ascorbic acid (VITAMIN C) 500 MG tablet Take 500 mg by mouth daily.   baclofen (LIORESAL) 10 MG tablet Take 3 times daily as needed for muscle relaxant for neck   carvedilol (COREG) 12.5 MG tablet Take 1 tablet (12.5 mg total) by mouth 2 (two) times daily.   cloNIDine (CATAPRES) 0.1 MG tablet Take 1 tablet (0.1 mg  total) by mouth at bedtime.   diazepam (VALIUM) 10 MG tablet TAKE 1 TABLET BY MOUTH EVERY DAY AS NEEDED FOR MUSCLE SPASM   diltiazem (CARDIZEM CD) 120 MG 24 hr capsule Take 1 capsule (120 mg total) by mouth every morning.   diltiazem (CARDIZEM CD) 240 MG 24 hr capsule Take 1 capsule (240 mg total) by mouth at bedtime.   famotidine (PEPCID) 40 MG tablet Take 1 tablet (40 mg total) by mouth 2 (two) times daily.   fluticasone (FLONASE) 50 MCG/ACT nasal spray PLACE 2 SPRAYS IN THE NOSTRIL EVERY DAY   hydrALAZINE (APRESOLINE) 25 MG tablet Take 1 tablet (25 mg total) by mouth 3 (three) times daily.   ibuprofen (ADVIL) 800 MG tablet Take 1 tablet (800 mg total) by mouth in the morning, at noon, and at bedtime.   lactulose (CHRONULAC) 10 GM/15ML solution Take 15 mLs (10 g total) by mouth 3 (three) times daily.   montelukast (SINGULAIR) 10 MG tablet Take 1 tablet (10 mg total) by mouth at bedtime.   omeprazole (PRILOSEC) 40 MG capsule Take 1 capsule (40 mg total) by mouth daily.   potassium chloride (KLOR-CON 10) 10 MEQ tablet TAKE 3 TABLETS BY MOUTH EVERY DAY   predniSONE (DELTASONE) 20 MG tablet Take 1 daily for 3 days for sore throat.   rosuvastatin (CRESTOR) 40 MG tablet Take 1 tablet (40 mg total) by mouth daily.   traMADol (ULTRAM) 50 MG tablet TAKE 1 TABLET BY MOUTH EVERY 12 HOURS AS NEEDED FOR MODERATE PAIN   zinc sulfate 220 (50 Zn) MG capsule Take 1 capsule (220 mg total) by mouth daily.     Allergies:   Ace inhibitors, Iodinated diagnostic agents, Lisinopril, Sulfa antibiotics, Red dye, Tape, Yellow dye, Bactrim, Flexeril [cyclobenzaprine hcl], Latex, and Naprosyn [naproxen]   Social History   Socioeconomic History   Marital status: Married    Spouse name: Not on file   Number of children: Not on file   Years of education: Not on file   Highest education level: Not on file  Occupational History   Not on file  Tobacco Use   Smoking status: Never   Smokeless tobacco: Never  Vaping  Use   Vaping Use: Never used  Substance and Sexual Activity   Alcohol use: No    Alcohol/week: 0.0 standard drinks   Drug use: No   Sexual activity: Yes    Birth control/protection: Post-menopausal, Surgical    Comment: Hysterectomy  Other Topics Concern   Not on file  Social History Narrative   Marital status:  Married x 38 years; from the [place]; moved to [place] in 1987.      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      Advanced Directives: DNR/DNI; HCPOA: Sati Forse-Enicole         Tobacco use, amount per day now: None.   Past tobacco use, amount per day: None.   How many years did you use tobacco: N/A   Alcohol use (drinks per week): N/A   Diet: Regular.   Do you drink/eat things with caffeine: No.   Marital status:  Married                                What year were you married? 1981   Do you live in a house, apartment, assisted living, condo, trailer, etc.? House.   Is it one or more stories?    How many persons live in your home? 2   Do you have pets in your home?( please list) None.   Highest Level of education completed? Bachelors Degree in Nursing.   Current or past profession: RN   Do you exercise?     Yes                             Type and how often? 4 times a week at the gym.   Do you have a living will? No.   Do you have a DNR form? No.                                  If not, do you want to discuss one?   Do you have signed POA/HPOA forms?  No.                      If so, please bring to you appointment      Do you have any difficulty bathing or dressing yourself? No.   Do you have any difficulty preparing food or eating? No.   Do you have any difficulty managing your medications? No.   Do you have any difficulty managing your finances? No.    Do you have any difficulty affording your medications? No.   Social Determinants of Health   Financial Resource Strain: Not on file  Food Insecurity: Not on file  Transportation Needs: Not on file  Physical Activity: Not on file  Stress: Not on file  Social Connections: Not on file     Family History: The patient's family history includes Cancer in her maternal grandmother; Hypertension in her brother, mother, and sister; Stroke in her brother and mother; Stroke (age of onset: 74) in her sister. ROS:   Please see the history of present illness.    All other systems reviewed and are negative.  EKGs/Labs/Other Studies Reviewed:    The following studies were reviewed today:  Recent Labs: 09/29/2020: ALT 18; BUN 15; Creat 0.72; Hemoglobin 12.5; Platelets 260; Potassium 4.5; Sodium 142; TSH 1.83  Recent Lipid Panel    Component Value Date/Time   CHOL 126 09/29/2020 0807   CHOL 173 03/06/2020 0856   TRIG 74 09/29/2020 0807   HDL 56 09/29/2020 0807   HDL 81 03/06/2020 0856   CHOLHDL 2.3 09/29/2020 0807   VLDL 35 (H) 02/17/2015 1956   LDLCALC 55 09/29/2020 0807    Physical Exam:    VS:  BP 110/68 (  BP Location: Left Arm, Patient Position: Sitting, Cuff Size: Normal)   Pulse 76   Ht 4\' 11"  (1.499 m)   Wt 158 lb (71.7 kg)   SpO2 96%   BMI 31.91 kg/m     Wt Readings from Last 3 Encounters:  11/27/20 158 lb (71.7 kg)  11/27/20 157 lb (71.2 kg)  09/29/20 149 lb 3.2 oz (67.7 kg)     GEN:  Well nourished, well developed in no acute distress HEENT: Normal NECK: No JVD; No carotid bruits LYMPHATICS: No lymphadenopathy CARDIAC: RRR, no murmurs, rubs, gallops RESPIRATORY:  Clear to auscultation without rales, wheezing or rhonchi  ABDOMEN: Soft, non-tender, non-distended MUSCULOSKELETAL:  No edema; No deformity  SKIN: Warm and dry NEUROLOGIC:  Alert and oriented x 3 PSYCHIATRIC:  Normal affect    Signed, 10/01/20, MD  11/27/2020 3:39 PM    [facility]

## 2020-12-25 ENCOUNTER — Other Ambulatory Visit: Payer: Self-pay | Admitting: Family

## 2020-12-25 ENCOUNTER — Encounter: Payer: Self-pay | Admitting: Family

## 2020-12-25 ENCOUNTER — Other Ambulatory Visit: Payer: Self-pay

## 2020-12-25 ENCOUNTER — Ambulatory Visit: Payer: Federal, State, Local not specified - PPO | Admitting: Family

## 2020-12-25 VITALS — BP 126/80 | HR 77 | Temp 97.1°F | Resp 16 | Ht 59.0 in

## 2020-12-25 DIAGNOSIS — J3089 Other allergic rhinitis: Secondary | ICD-10-CM

## 2020-12-25 DIAGNOSIS — J029 Acute pharyngitis, unspecified: Secondary | ICD-10-CM | POA: Diagnosis not present

## 2020-12-25 DIAGNOSIS — M542 Cervicalgia: Secondary | ICD-10-CM

## 2020-12-25 DIAGNOSIS — R059 Cough, unspecified: Secondary | ICD-10-CM | POA: Diagnosis not present

## 2020-12-25 DIAGNOSIS — R0989 Other specified symptoms and signs involving the circulatory and respiratory systems: Secondary | ICD-10-CM

## 2020-12-25 DIAGNOSIS — J4520 Mild intermittent asthma, uncomplicated: Secondary | ICD-10-CM

## 2020-12-25 DIAGNOSIS — G8929 Other chronic pain: Secondary | ICD-10-CM

## 2020-12-25 DIAGNOSIS — K219 Gastro-esophageal reflux disease without esophagitis: Secondary | ICD-10-CM

## 2020-12-25 DIAGNOSIS — I10 Essential (primary) hypertension: Secondary | ICD-10-CM

## 2020-12-25 DIAGNOSIS — E782 Mixed hyperlipidemia: Secondary | ICD-10-CM

## 2020-12-25 LAB — POCT RAPID STREP A (OFFICE): Rapid Strep A Screen: NEGATIVE

## 2020-12-25 MED ORDER — FLUTICASONE PROPIONATE 50 MCG/ACT NA SUSP: NASAL | 3 refills | Status: DC

## 2020-12-25 MED ORDER — ALBUTEROL SULFATE HFA 108 (90 BASE) MCG/ACT IN AERS: INHALATION_SPRAY | RESPIRATORY_TRACT | 5 refills | Status: DC

## 2020-12-25 MED ORDER — TRAMADOL HCL 50 MG PO TABS
ORAL_TABLET | ORAL | 1 refills | Status: DC
Start: 2020-12-25 — End: 2021-12-03

## 2020-12-25 MED ORDER — AMOXICILLIN-POT CLAVULANATE 875-125 MG PO TABS: 1.0000 | ORAL_TABLET | Freq: Two times a day (BID) | ORAL | 0 refills | Status: DC

## 2020-12-25 MED ORDER — OMEPRAZOLE 40 MG PO CPDR: 40.0000 mg | DELAYED_RELEASE_CAPSULE | Freq: Every day | ORAL | 1 refills | Status: DC

## 2020-12-25 MED ORDER — ROSUVASTATIN CALCIUM 40 MG PO TABS: 40.0000 mg | ORAL_TABLET | Freq: Every day | ORAL | 3 refills | Status: DC

## 2020-12-25 MED ORDER — MONTELUKAST SODIUM 10 MG PO TABS
10.0000 mg | ORAL_TABLET | Freq: Every day | ORAL | 1 refills | Status: DC
Start: 2020-12-25 — End: 2021-09-23

## 2020-12-25 MED ORDER — POTASSIUM CHLORIDE ER 10 MEQ PO TBCR: EXTENDED_RELEASE_TABLET | ORAL | 1 refills | Status: DC

## 2020-12-25 MED ORDER — FAMOTIDINE 40 MG PO TABS
40.0000 mg | ORAL_TABLET | Freq: Two times a day (BID) | ORAL | 1 refills | Status: DC
Start: 2020-12-25 — End: 2021-09-23

## 2020-12-25 MED ORDER — CLONIDINE HCL 0.1 MG PO TABS
0.1000 mg | ORAL_TABLET | Freq: Every day | ORAL | 3 refills | Status: DC
Start: 2020-12-25 — End: 2021-09-23

## 2020-12-25 MED ORDER — AMILORIDE HCL 5 MG PO TABS: 5.0000 mg | ORAL_TABLET | Freq: Every day | ORAL | 1 refills | Status: DC

## 2020-12-25 MED ORDER — PREDNISONE 20 MG PO TABS: ORAL_TABLET | ORAL | 0 refills | Status: AC

## 2020-12-25 MED ORDER — DILTIAZEM HCL ER COATED BEADS 120 MG PO CP24
120.0000 mg | ORAL_CAPSULE | Freq: Every morning | ORAL | 3 refills | Status: DC
Start: 2020-12-25 — End: 2021-06-01

## 2020-12-25 MED ORDER — LACTULOSE 10 GM/15ML PO SOLN: 10.0000 g | Freq: Three times a day (TID) | ORAL | 2 refills | Status: DC

## 2020-12-25 MED ORDER — DIAZEPAM 10 MG PO TABS: ORAL_TABLET | ORAL | 1 refills | Status: DC

## 2020-12-25 MED ORDER — HYDRALAZINE HCL 25 MG PO TABS: 25.0000 mg | ORAL_TABLET | Freq: Three times a day (TID) | ORAL | 3 refills | Status: DC

## 2020-12-25 MED ORDER — CARVEDILOL 12.5 MG PO TABS: 12.5000 mg | ORAL_TABLET | Freq: Two times a day (BID) | ORAL | 3 refills | Status: DC

## 2020-12-25 MED ORDER — DILTIAZEM HCL ER COATED BEADS 240 MG PO CP24: 240.0000 mg | ORAL_CAPSULE | Freq: Every day | ORAL | 3 refills | Status: DC

## 2020-12-25 MED ORDER — BACLOFEN 10 MG PO TABS: ORAL_TABLET | ORAL | 1 refills | Status: DC

## 2020-12-25 MED ORDER — IBUPROFEN 800 MG PO TABS: 800.0000 mg | ORAL_TABLET | Freq: Three times a day (TID) | ORAL | 1 refills | Status: DC

## 2020-12-25 NOTE — Patient Instructions (Signed)
-   Notify [provider if symptoms worsen or not resolved

## 2020-12-25 NOTE — Progress Notes (Signed)
Provider: Richarda Blade FNP-C  Enrico Eaddy, Donalee Citrin, NP  Patient Care Team: Reah Justo, Donalee Citrin, NP as PCP - General (Family Medicine) Baldo Daub, MD as Referring Physician (Cardiology) Camille Bal, MD as Consulting Physician (Nephrology)  Extended Emergency Contact Information Primary Emergency Contact: Kuchera,Sotero Address: [address]          [place] [address] [place] Home Phone: [phone] Mobile Phone: [phone] Relation: Spouse Secondary Emergency Contact: Sobiech,Sagi Mobile Phone: [phone] Relation: Daughter  Code Status:  Full Code  Goals of care: Advanced Directive information Advanced Directives 12/25/2020  Does Patient Have a Medical Advance Directive? No  Would patient like information on creating a medical advance directive? No - Patient declined     Chief Complaint  Patient presents with   Acute Visit    Patient complains of runny nose, sore throat, and cough. 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    HPI:  Pt is a 65 y.o. 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 She denies any fever or chills. Appetite is good.  She requests all her medication to be refilled for 90 day supply.   Past Medical History:  Diagnosis Date   Acute maxillary sinusitis 11/12/2015   Allergy    Anxiety state 05/17/2011   AR (allergic rhinitis) 02/17/2012   Asthma    BMI 31.0-31.9,adult 03/23/2011   Cough 03/23/2011   Dental disease 02/17/2012   [place] 01[place]2012    Essential hypertension, benign 03/23/2011   GERD (gastroesophageal reflux disease)  05/17/2011   H/O varicella    History of measles, mumps, or rubella    Hyperlipemia 03/23/2011   Hyperlipidemia    Hypertension    Hypertensive disorder 09/30/2016   Insomnia 05/17/2011   Mid back pain 02/17/2012   Neck pain 02/17/2012   Obesity 09/30/2016   Pelvic floor relaxation 09/30/2016   RAD (reactive airway disease) 02/17/2012   Past Surgical History:  Procedure Laterality Date   REPLACEMENT TOTAL KNEE Right    TOTAL KNEE ARTHROPLASTY Left 01/07/2020   Procedure: TOTAL KNEE ARTHROPLASTY;  Surgeon: Ollen Gross, MD;  Location: [facility];  Service: Orthopedics;  Laterality: Left;    TUBAL LIGATION     VAGINAL HYSTERECTOMY     uterine prolapse; DUB; ovaries intact.  [facility]    Allergies  Allergen Reactions   Ace Inhibitors Cough   Iodinated Diagnostic Agents Hives    Chills   Lisinopril Cough   Sulfa Antibiotics Hives   Red Dye    Tape     Surgical tape causes itching   Yellow Dye    Bactrim Rash   Flexeril [Cyclobenzaprine Hcl] Nausea Only   Latex Itching   Naprosyn [Naproxen] Nausea And Vomiting    And high doses of NSAIDS    Outpatient Encounter Medications as of 12/25/2020  Medication Sig   albuterol (PROVENTIL HFA) 108 (90 Base) MCG/ACT inhaler INHALE 2 PUFFS INTO THE LUNGS EVERY 6 HOURS AS NEEDED FOR WHEEZING OR SHORTNESS OF BREATH   aMILoride (MIDAMOR) 5 MG tablet Take 1 tablet (5 mg total) by mouth daily.   ascorbic acid (VITAMIN C) 500 MG  tablet Take 500 mg by mouth daily.   baclofen (LIORESAL) 10 MG tablet Take 3 times daily as needed for muscle relaxant for neck   carvedilol (COREG) 12.5 MG tablet Take 1 tablet (12.5 mg total) by mouth 2 (two) times daily.   cloNIDine (CATAPRES) 0.1 MG tablet Take 1 tablet (0.1 mg total) by mouth at bedtime.   diazepam (VALIUM) 10 MG tablet TAKE 1 TABLET BY MOUTH EVERY DAY AS NEEDED FOR MUSCLE SPASM   diltiazem (CARDIZEM CD) 120 MG 24 hr capsule Take 1 capsule (120 mg total) by mouth every morning.   diltiazem (CARDIZEM CD)  240 MG 24 hr capsule Take 1 capsule (240 mg total) by mouth at bedtime.   famotidine (PEPCID) 40 MG tablet Take 1 tablet (40 mg total) by mouth 2 (two) times daily.   fluticasone (FLONASE) 50 MCG/ACT nasal spray PLACE 2 SPRAYS IN THE NOSTRIL EVERY DAY   hydrALAZINE (APRESOLINE) 25 MG tablet Take 1 tablet (25 mg total) by mouth 3 (three) times daily.   ibuprofen (ADVIL) 800 MG tablet Take 1 tablet (800 mg total) by mouth in the morning, at noon, and at bedtime.   lactulose (CHRONULAC) 10 GM/15ML solution Take 15 mLs (10 g total) by mouth 3 (three) times daily.   montelukast (SINGULAIR) 10 MG tablet Take 1 tablet (10 mg total) by mouth at bedtime.   omeprazole (PRILOSEC) 40 MG capsule Take 1 capsule (40 mg total) by mouth daily.   potassium chloride (KLOR-CON 10) 10 MEQ tablet TAKE 3 TABLETS BY MOUTH EVERY DAY   predniSONE (DELTASONE) 20 MG tablet Take 1 daily for 3 days for sore throat.   rosuvastatin (CRESTOR) 40 MG tablet Take 1 tablet (40 mg total) by mouth daily.   traMADol (ULTRAM) 50 MG tablet TAKE 1 TABLET BY MOUTH EVERY 12 HOURS AS NEEDED FOR MODERATE PAIN   zinc sulfate 220 (50 Zn) MG capsule Take 1 capsule (220 mg total) by mouth daily.   No facility-administered encounter medications on file as of 12/25/2020.    Review of Systems  Constitutional:  Negative for appetite change, chills, fatigue and fever.  HENT:  Positive for congestion, rhinorrhea and sore throat. Negative for sinus pressure, sinus pain and sneezing.   Eyes:  Positive for itching. Negative for pain, discharge and redness.  Respiratory:  Positive for cough. Negative for chest tightness, shortness of breath and wheezing.   Cardiovascular:  Negative for chest pain, palpitations and leg swelling.  Gastrointestinal:  Negative for abdominal distention, abdominal pain, diarrhea, nausea and vomiting.  Musculoskeletal:  Positive for arthralgias and back pain. Negative for gait problem, joint swelling and myalgias.  Skin:   Negative for color change, pallor and rash.  Neurological:  Negative for dizziness and headaches.   Immunization History  Administered Date(s) Administered   Influenza,inj,Quad PF,6+ Mos 12/14/2018, 10/18/2019   Influenza,inj,quad, With Preservative 11/16/2014, 11/16/2018   Influenza-Unspecified 11/13/2014, 12/17/2015, 12/16/2016   Moderna Sars-Covid-2 Vaccination 02/26/2019, 03/24/2019, 01/03/2020   Td 12/17/2006   Pertinent  Health Maintenance Due  Topic Date Due   PAP SMEAR-Modifier  11/14/2019   INFLUENZA VACCINE  09/15/2020   DEXA SCAN  Never done   MAMMOGRAM  08/30/2021   COLONOSCOPY (Pts 45-43yrs Insurance coverage will need to be confirmed)  03/29/2029   Fall Risk 03/14/2020 04/18/2020 09/29/2020 11/27/2020 12/25/2020  Falls in the past year? 0 0 0 0 0  Was there an injury with Fall? 0 - 0 0 0  Fall Risk Category Calculator 0 - 0  0 0  Fall Risk Category Low - Low Low Low  Patient Fall Risk Level - - Low fall risk Low fall risk Low fall risk  Patient at Risk for Falls Due to - - No Fall Risks No Fall Risks No Fall Risks  Patient at Risk for Falls Due to - - - - -  Fall risk Follow up Falls evaluation completed - Falls evaluation completed Falls evaluation completed Falls evaluation completed   Functional Status Survey:    Vitals:   12/25/20 1021  BP: 126/80  Pulse: 77  Resp: 16  Temp: (!) 97.1 F (36.2 C)  SpO2: 97%  Height: 4\' 11"  (1.499 m)   Body mass index is 31.91 kg/m. Physical Exam Vitals reviewed.  Constitutional:      General: She is not in acute distress.    Appearance: Normal appearance. She is normal weight. She is not ill-appearing or diaphoretic.  HENT:     Head: Normocephalic.     Right Ear: Tympanic membrane, ear canal and external ear normal. There is no impacted cerumen.     Left Ear: Tympanic membrane, ear canal and external ear normal. There is no impacted cerumen.     Nose: Congestion and rhinorrhea present.     Mouth/Throat:     Mouth:  Mucous membranes are moist.     Pharynx: Oropharynx is clear. Posterior oropharyngeal erythema present. No oropharyngeal exudate.  Eyes:     General: No scleral icterus.       Right eye: No discharge.        Left eye: No discharge.     Extraocular Movements: Extraocular movements intact.     Conjunctiva/sclera: Conjunctivae normal.     Pupils: Pupils are equal, round, and reactive to light.  Neck:     Vascular: No carotid bruit.  Cardiovascular:     Rate and Rhythm: Normal rate and regular rhythm.     Pulses: Normal pulses.     Heart sounds: Normal heart sounds. No murmur heard.   No friction rub. No gallop.  Pulmonary:     Effort: Pulmonary effort is normal. No respiratory distress.     Breath sounds: Normal breath sounds. No wheezing, rhonchi or rales.  Chest:     Chest wall: No tenderness.  Abdominal:     General: Bowel sounds are normal. There is no distension.     Palpations: Abdomen is soft. There is no mass.     Tenderness: There is no abdominal tenderness. There is no right CVA tenderness, left CVA tenderness, guarding or rebound.  Musculoskeletal:        General: No swelling or tenderness. Normal range of motion.     Cervical back: Normal range of motion. No rigidity or tenderness.     Right lower leg: No edema.     Left lower leg: No edema.  Lymphadenopathy:     Cervical: No cervical adenopathy.  Skin:    General: Skin is warm and dry.     Coloration: Skin is not pale.     Findings: No bruising, erythema, lesion or rash.  Neurological:     Mental Status: She is alert and oriented to person, place, and time.     Cranial Nerves: No cranial nerve deficit.     Sensory: No sensory deficit.     Motor: No weakness.     Coordination: Coordination normal.     Gait: Gait normal.  Psychiatric:        Mood and Affect: Mood  normal.        Speech: Speech normal.        Behavior: Behavior normal.        Thought Content: Thought content normal.        Judgment: Judgment  normal.    Labs reviewed: Recent Labs    03/06/20 0856 05/16/20 0821 09/29/20 0807  NA 140 145* 142  K 4.2 4.9 4.5  CL 108* 109* 110  CO2 19* 20 25  GLUCOSE 94 95 89  BUN 19 15 15   CREATININE 0.68 0.72 0.72  CALCIUM 9.3 9.2 8.9   Recent Labs    03/06/20 0856 09/29/20 0807  AST 12 18  ALT 13 18  ALKPHOS 87  --   BILITOT <0.2 0.2  PROT 6.4 6.3  ALBUMIN 4.0  --    Recent Labs    03/06/20 1028 09/29/20 0807  WBC 8.9 4.6  NEUTROABS  --  2,116  HGB 11.6 12.5  HCT 37.5 40.7  MCV 84 83.1  PLT 242 260   Lab Results  Component Value Date   TSH 1.83 09/29/2020   Lab Results  Component Value Date   HGBA1C 5.5 09/29/2020   Lab Results  Component Value Date   CHOL 126 09/29/2020   HDL 56 09/29/2020   LDLCALC 55 09/29/2020   TRIG 74 09/29/2020   CHOLHDL 2.3 09/29/2020    Significant Diagnostic Results in last 30 days:  No results found.  Assessment/Plan 1. Acute pharyngitis, unspecified etiology Afebrile  Pharynx Erythema with tonsillar lymph tenderness noted  - POC Rapid Strep A results negative   2. Runny nose Recommend OTC loratadine daily x 14 days  - POC Rapid Strep A  3. Cough, unspecified type Bilateral Lungs clear to Auscultation.  - POC Rapid Strep A  4. Chronic neck pain Continue on Baclofen  - baclofen (LIORESAL) 10 MG tablet; Take 3 times daily as needed for muscle relaxant for neck  Dispense: 90 each; Refill: 1  5. Essential hypertension B/p well controlled  Continue current medication  - carvedilol (COREG) 12.5 MG tablet; Take 1 tablet (12.5 mg total) by mouth 2 (two) times daily.  Dispense: 180 tablet; Refill: 3 - cloNIDine (CATAPRES) 0.1 MG tablet; Take 1 tablet (0.1 mg total) by mouth at bedtime.  Dispense: 90 tablet; Refill: 3 - diltiazem (CARDIZEM CD) 240 MG 24 hr capsule; Take 1 capsule (240 mg total) by mouth at bedtime.  Dispense: 90 capsule; Refill: 3 - diltiazem (CARDIZEM CD) 120 MG 24 hr capsule; Take 1 capsule (120 mg  total) by mouth every morning.  Dispense: 90 capsule; Refill: 3 - hydrALAZINE (APRESOLINE) 25 MG tablet; Take 1 tablet (25 mg total) by mouth 3 (three) times daily.  Dispense: 270 tablet; Refill: 3  6. Gastroesophageal reflux disease without esophagitis Famotidine effective  H/H stable  - famotidine (PEPCID) 40 MG tablet; Take 1 tablet (40 mg total) by mouth 2 (two) times daily.  Dispense: 180 tablet; Refill: 1 - omeprazole (PRILOSEC) 40 MG capsule; Take 1 capsule (40 mg total) by mouth daily.  Dispense: 90 capsule; Refill: 1  7. Non-seasonal allergic rhinitis, unspecified trigger Continue Flonase  - fluticasone (FLONASE) 50 MCG/ACT nasal spray; PLACE 2 SPRAYS IN THE NOSTRIL EVERY DAY  Dispense: 48 g; Refill: 3  8. Mild intermittent asthma without complication Flare up due to allergies  - continue on albuterol PRN  - continue Singulair  - montelukast (SINGULAIR) 10 MG tablet; Take 1 tablet (10 mg total) by mouth at bedtime.  Dispense: 90 tablet; Refill: 1 - albuterol (PROVENTIL HFA) 108 (90 Base) MCG/ACT inhaler; INHALE 2 PUFFS INTO THE LUNGS EVERY 6 HOURS AS NEEDED FOR WHEEZING OR SHORTNESS OF BREATH  Dispense: 6.7 each; Refill: 5  9. Mixed hyperlipidemia LDL  - continue on Rosuvastatin  - dietary modification and exercise advised  - rosuvastatin (CRESTOR) 40 MG tablet; Take 1 tablet (40 mg total) by mouth daily.  Dispense: 90 tablet; Refill: 3  Family/ staff Communication: Reviewed plan of care with patient verbalized understanding   Labs/tests ordered:  - POC Rapid Strep A   Next Appointment: As needed if symptoms worsen or fail to improve    Caesar Bookman, NP

## 2021-03-05 ENCOUNTER — Telehealth: Payer: Self-pay | Admitting: Cardiology

## 2021-03-05 NOTE — Telephone Encounter (Signed)
STAT if patient feels like he/she is going to faint   Are you dizzy now? no  Do you feel faint or have you passed out? no  Do you have any other symptoms? no  Have you checked your HR and BP (record if available)? Blood pressure is normal

## 2021-03-05 NOTE — Telephone Encounter (Signed)
Tried calling patient. No answer and no voicemail set up for me to leave a message. 

## 2021-03-05 NOTE — Telephone Encounter (Signed)
Pt returning phone call... please advise  

## 2021-03-05 NOTE — Telephone Encounter (Signed)
Spoke to the patient just now and she let me know that she is having dizzy spells. She is not sure where this is coming from because her blood pressure is normal 110/68 and heart rate is normal 82. I advised at this time that she should follow up with her PCP. I did go ahead and schedule her 6 month follow up while I had her on the phone for in march.    Encouraged patient to call back with any questions or concerns.

## 2021-03-16 ENCOUNTER — Other Ambulatory Visit: Payer: Self-pay | Admitting: *Deleted

## 2021-03-16 MED ORDER — DIAZEPAM 10 MG PO TABS: ORAL_TABLET | ORAL | 5 refills | Status: DC

## 2021-03-16 NOTE — Telephone Encounter (Signed)
Patient requested refill.  Pended Rx and sent to [facility] for approval.  Epic LR: 12/25/2020

## 2021-03-25 ENCOUNTER — Other Ambulatory Visit: Payer: Self-pay | Admitting: Family

## 2021-03-25 DIAGNOSIS — G8929 Other chronic pain: Secondary | ICD-10-CM

## 2021-04-27 ENCOUNTER — Ambulatory Visit: Payer: Federal, State, Local not specified - PPO | Admitting: Cardiology

## 2021-05-30 ENCOUNTER — Other Ambulatory Visit: Payer: Self-pay | Admitting: Family

## 2021-05-30 DIAGNOSIS — K219 Gastro-esophageal reflux disease without esophagitis: Secondary | ICD-10-CM

## 2021-06-01 ENCOUNTER — Telehealth: Payer: Self-pay | Admitting: Cardiology

## 2021-06-01 ENCOUNTER — Other Ambulatory Visit: Payer: Self-pay

## 2021-06-01 DIAGNOSIS — I10 Essential (primary) hypertension: Secondary | ICD-10-CM | POA: Diagnosis not present

## 2021-06-01 DIAGNOSIS — R7303 Prediabetes: Secondary | ICD-10-CM | POA: Diagnosis not present

## 2021-06-01 DIAGNOSIS — G8929 Other chronic pain: Secondary | ICD-10-CM

## 2021-06-01 DIAGNOSIS — E782 Mixed hyperlipidemia: Secondary | ICD-10-CM | POA: Diagnosis not present

## 2021-06-01 DIAGNOSIS — K219 Gastro-esophageal reflux disease without esophagitis: Secondary | ICD-10-CM

## 2021-06-01 MED ORDER — DILTIAZEM HCL ER COATED BEADS 120 MG PO CP24: 120.0000 mg | ORAL_CAPSULE | Freq: Every morning | ORAL | 3 refills | Status: DC

## 2021-06-01 MED ORDER — OMEPRAZOLE 40 MG PO CPDR: 40.0000 mg | DELAYED_RELEASE_CAPSULE | Freq: Every day | ORAL | 1 refills | Status: DC

## 2021-06-01 MED ORDER — DILTIAZEM HCL ER COATED BEADS 240 MG PO CP24: 240.0000 mg | ORAL_CAPSULE | Freq: Every day | ORAL | 3 refills | Status: DC

## 2021-06-01 MED ORDER — DIAZEPAM 10 MG PO TABS: ORAL_TABLET | ORAL | 5 refills | Status: DC

## 2021-06-01 MED ORDER — BACLOFEN 10 MG PO TABS: ORAL_TABLET | ORAL | 2 refills | Status: DC

## 2021-06-01 NOTE — Addendum Note (Signed)
Addended by: Eleonore Chiquito on: 06/01/2021 11:50 AM ? ? Modules accepted: Orders ? ?

## 2021-06-01 NOTE — Telephone Encounter (Signed)
Brooke Wilson from [facility] calling with the patient currently in their office. She states the patient said she always gets labs done prior to her appointments, but they do not have any lab orders.  ?

## 2021-06-01 NOTE — Telephone Encounter (Signed)
Patient called in to say that she is supposed to have lab work done. Explain to the the patient that their is no order in for that, and I will have send message back. Please advise.  ?

## 2021-06-01 NOTE — Telephone Encounter (Signed)
Orders placed for labs

## 2021-06-01 NOTE — Telephone Encounter (Signed)
Brooke Wilson is aware that the orders are in and released. Brooke Wilson confirms that she has the orders. ?

## 2021-06-01 NOTE — Telephone Encounter (Signed)
High risk warning populated when attempting to approve refill request. Will send to Ngetich, Dinah C, NP to review.   

## 2021-06-01 NOTE — Telephone Encounter (Signed)
Patient scheduled a follow up appointment for June but need her refills before then. Pharmacy confirmed. To Dinah to approve.  ?

## 2021-06-01 NOTE — Telephone Encounter (Signed)
? ?  Pt calling back, she also wants to include A1c test ?

## 2021-06-01 NOTE — Telephone Encounter (Signed)
Pt will make lab aware of add on. ?

## 2021-06-02 LAB — COMPREHENSIVE METABOLIC PANEL
ALT: 15 IU/L (ref 0–32)
AST: 18 IU/L (ref 0–40)
Albumin/Globulin Ratio: 1.9 (ref 1.2–2.2)
Albumin: 4.3 g/dL (ref 3.8–4.8)
Alkaline Phosphatase: 78 IU/L (ref 44–121)
BUN/Creatinine Ratio: 39 — ABNORMAL HIGH (ref 12–28)
BUN: 30 mg/dL — ABNORMAL HIGH (ref 8–27)
Bilirubin Total: <0.2 mg/dL (ref 0.0–1.2)
CO2: 20 mmol/L (ref 20–29)
Calcium: 9.2 mg/dL (ref 8.7–10.3)
Chloride: 111 mmol/L — ABNORMAL HIGH (ref 96–106)
Creatinine, Ser: 0.77 mg/dL (ref 0.57–1.00)
Globulin, Total: 2.3 g/dL (ref 1.5–4.5)
Glucose: 80 mg/dL (ref 70–99)
Potassium: 4.2 mmol/L (ref 3.5–5.2)
Sodium: 143 mmol/L (ref 134–144)
Total Protein: 6.6 g/dL (ref 6.0–8.5)
eGFR: 86 mL/min/{1.73_m2} (ref >59)

## 2021-06-02 LAB — LIPID PANEL
Chol/HDL Ratio: 2.3 ratio (ref 0.0–4.4)
Cholesterol, Total: 161 mg/dL (ref 100–199)
HDL: 69 mg/dL (ref >39)
LDL Chol Calc (NIH): 66 mg/dL (ref 0–99)
Triglycerides: 157 mg/dL — ABNORMAL HIGH (ref 0–149)
VLDL Cholesterol Cal: 26 mg/dL (ref 5–40)

## 2021-06-02 LAB — HEMOGLOBIN A1C
Est. average glucose Bld gHb Est-mCnc: 126 mg/dL
Hgb A1c MFr Bld: 6 % — ABNORMAL HIGH (ref 4.8–5.6)

## 2021-06-04 ENCOUNTER — Encounter: Payer: Self-pay | Admitting: Cardiology

## 2021-06-04 ENCOUNTER — Ambulatory Visit: Payer: Medicare Other | Admitting: Cardiology

## 2021-06-04 VITALS — BP 108/70 | HR 84 | Ht 59.0 in | Wt 156.1 lb

## 2021-06-04 DIAGNOSIS — I10 Essential (primary) hypertension: Secondary | ICD-10-CM | POA: Diagnosis not present

## 2021-06-04 DIAGNOSIS — I1A0 Resistant hypertension: Secondary | ICD-10-CM

## 2021-06-04 DIAGNOSIS — R7303 Prediabetes: Secondary | ICD-10-CM | POA: Diagnosis not present

## 2021-06-04 DIAGNOSIS — E782 Mixed hyperlipidemia: Secondary | ICD-10-CM

## 2021-06-04 NOTE — Progress Notes (Signed)
?Cardiology Office Note:   ? ?Date:  06/04/2021  ? ?ID:  Brooke Wilson, DOB 05/13/1955, MRN [id number] ? ?PCP:  Ngetich, Donalee Citrin, NP  ?Cardiologist:  Norman Herrlich, MD   ? ?Referring MD: Caesar Bookman, NP  ? ? ?ASSESSMENT:   ? ?1. Resistant hypertension   ?2. Mixed hyperlipidemia   ?3. Prediabetes   ? ?PLAN:   ? ?In order of problems listed above: ? ?Remarkable improvement she has titrated down on her multidrug regimen blood pressure is at target continue the same I told her if she has further hypotension hold hydralazine.  Certainly seeing nephrology and starting MRA was very beneficial.  Current regimen includes calcium channel blocker beta-blocker hydralazine and MRA. ?Continue her statin lipids are at target ?Well-controlled ? ? ?Next appointment: 6 months ? ? ?Medication Adjustments/Labs and Tests Ordered: ?Current medicines are reviewed at length with the patient today.  Concerns regarding medicines are outlined above.  ?No orders of the defined types were placed in this encounter. ? ?No orders of the defined types were placed in this encounter. ? ? ?Chief Complaint  ?Patient presents with  ? Follow-up  ? Hypertension  ? ? ?History of Present Illness:   ? ?Brooke Wilson is a 66 y.o. female with a hx of resistant hypertension and hyperlipidemia last seen 05/16/2020.  She had been seen by nephrology was given an MRA that improved both hypokalemia and blood pressure control and with peripheral edema her calcium channel blocker dosage was decreased last seen 11/27/2020. ?Compliance with diet, lifestyle and medications: Yes ? ?She has had a remarkable improvement in her hypertension ?At times she gets blood pressures of less than 110 and she holds medications ?She only takes clonidine if needed and has reduced her hydralazine to twice daily ?She feels much better and has no edema shortness of breath chest pain palpitation or syncope ?Her labs performed before this visit showed normal renal function  potassium 4.2 sodium 143 her lipids are ideal with an LDL of 66 her A1c 6.3 ?She has no muscle pain or weakness from her statin ?Past Medical History:  ?Diagnosis Date  ? Acute maxillary sinusitis 11/12/2015  ? Allergy   ? Anxiety state 05/17/2011  ? AR (allergic rhinitis) 02/17/2012  ? Asthma   ? BMI 31.0-31.9,adult 03/23/2011  ? Cough 03/23/2011  ? Dental disease 02/17/2012  ? [place]ppers [place] in [place] 01/2012   ? Essential hypertension, benign 03/23/2011  ? GERD (gastroesophageal reflux disease) 05/17/2011  ? H/O varicella   ? History of measles, mumps, or rubella   ? Hyperlipemia 03/23/2011  ? Hyperlipidemia   ? Hypertension   ? Hypertensive disorder 09/30/2016  ? Insomnia 05/17/2011  ? Mid back pain 02/17/2012  ? Neck pain 02/17/2012  ? Obesity 09/30/2016  ? Pelvic floor relaxation 09/30/2016  ? RAD (reactive airway disease) 02/17/2012  ? ? ?Past Surgical History:  ?Procedure Laterality Date  ? REPLACEMENT TOTAL KNEE Right   ? TOTAL KNEE ARTHROPLASTY Left 01/07/2020  ? Procedure: TOTAL KNEE ARTHROPLASTY;  Surgeon: Ollen Gross, MD;  Location: [facility];  Service: Orthopedics;  Laterality: Left;   ? TUBAL LIGATION    ? VAGINAL HYSTERECTOMY    ? uterine prolapse; DUB; ovaries intact.  [facility]  ? ? ?Current Medications: ?Current Meds  ?Medication Sig  ? albuterol (PROVENTIL HFA) 108 (90 Base) MCG/ACT inhaler INHALE 2 PUFFS INTO THE LUNGS EVERY 6 HOURS AS NEEDED FOR WHEEZING OR SHORTNESS OF BREATH  ? aMILoride (MIDAMOR) 5 MG  tablet Take 1 tablet (5 mg total) by mouth daily.  ? amoxicillin-clavulanate (AUGMENTIN) 875-125 MG tablet Take 1 tablet by mouth 2 (two) times daily.  ? ascorbic acid (VITAMIN C) 500 MG tablet Take 500 mg by mouth daily.  ? baclofen (LIORESAL) 10 MG tablet TAKE 1 TABLET BY MOUTH 3 TIMES A DAY AS NEEDED FOR MUSCLE RELAXANT FOR NECK  ? carvedilol (COREG) 12.5 MG tablet Take 1 tablet (12.5 mg total) by mouth 2 (two) times daily.  ? cloNIDine (CATAPRES) 0.1 MG tablet Take 1 tablet (0.1 mg total) by mouth at  bedtime.  ? diazepam (VALIUM) 10 MG tablet TAKE 1 TABLET BY MOUTH EVERY DAY AS NEEDED FOR MUSCLE SPASM  ? diltiazem (CARDIZEM CD) 120 MG 24 hr capsule Take 1 capsule (120 mg total) by mouth every morning.  ? diltiazem (CARDIZEM CD) 240 MG 24 hr capsule Take 1 capsule (240 mg total) by mouth at bedtime.  ? famotidine (PEPCID) 40 MG tablet Take 1 tablet (40 mg total) by mouth 2 (two) times daily.  ? fluticasone (FLONASE) 50 MCG/ACT nasal spray PLACE 2 SPRAYS IN THE NOSTRIL EVERY DAY  ? hydrALAZINE (APRESOLINE) 25 MG tablet Take 1 tablet (25 mg total) by mouth 3 (three) times daily.  ? ibuprofen (ADVIL) 800 MG tablet Take 1 tablet (800 mg total) by mouth in the morning, at noon, and at bedtime.  ? lactulose (CHRONULAC) 10 GM/15ML solution Take 15 mLs (10 g total) by mouth 3 (three) times daily.  ? montelukast (SINGULAIR) 10 MG tablet Take 1 tablet (10 mg total) by mouth at bedtime.  ? omeprazole (PRILOSEC) 40 MG capsule TAKE 1 CAPSULE (40 MG TOTAL) BY MOUTH DAILY.  ? omeprazole (PRILOSEC) 40 MG capsule Take 1 capsule (40 mg total) by mouth daily.  ? potassium chloride (KLOR-CON 10) 10 MEQ tablet TAKE 3 TABLETS BY MOUTH EVERY DAY  ? predniSONE (DELTASONE) 20 MG tablet Take 1 daily for 3 days for sore throat.  ? rosuvastatin (CRESTOR) 40 MG tablet Take 1 tablet (40 mg total) by mouth daily.  ? traMADol (ULTRAM) 50 MG tablet TAKE 1 TABLET BY MOUTH EVERY 12 HOURS AS NEEDED FOR MODERATE PAIN  ? zinc sulfate 220 (50 Zn) MG capsule Take 1 capsule (220 mg total) by mouth daily.  ?  ? ?Allergies:   Ace inhibitors, Iodinated contrast media, Lisinopril, Sulfa antibiotics, Iodine, Red dye, Tape, Yellow dye, Bactrim, Flexeril [cyclobenzaprine hcl], Latex, Naprosyn [naproxen], and Sulfamethoxazole-trimethoprim  ? ?Social History  ? ?Socioeconomic History  ? Marital status: Married  ?  Spouse name: Not on file  ? Number of children: Not on file  ? Years of education: Not on file  ? Highest education level: Not on file  ?Occupational  History  ? Not on file  ?Tobacco Use  ? Smoking status: Never  ?  Passive exposure: Never  ? Smokeless tobacco: Never  ?Vaping Use  ? Vaping Use: Never used  ?Substance and Sexual Activity  ? Alcohol use: No  ?  Alcohol/week: 0.0 standard drinks  ? Drug use: No  ? Sexual activity: Yes  ?  Birth control/protection: Post-menopausal, Surgical  ?  Comment: Hysterectomy  ?Other Topics Concern  ? Not on file  ?Social History Narrative  ? Marital status:  Married x 38 years; from the [place]; moved to [place] in 1987.  ?    Children: 3 daughters; 2 grandchildren  ?    Lives: with husband  ?    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    Tobacco: none  ?    Alcohol: none  ?   Exercise: walking at work; dancing in [place] in 2018  ?    ADLs: independent with ADLs.  ?    Advanced Directives: DNR/DNI; HCPOA: Sati Metayer-Enicole  ?   ?   ? Tobacco use, amount per day now: None.  ? Past tobacco use, amount per day: None.  ? How many years did you use tobacco: N/A  ? Alcohol use (drinks per week): N/A  ? Diet: Regular.  ? Do you drink/eat things with caffeine: No.  ? Marital status:  Married                                What year were you married? 1981  ? Do you live in a house, apartment, assisted living, condo, trailer, etc.? House.  ? Is it one or more stories?   ? How many persons live in your home? 2  ? Do you have pets in your home?( please list) None.  ? Highest Level of education completed? Bachelors Degree in Nursing.  ? Current or past profession: RN  ? Do you exercise?     Yes                             Type and how often? 4 times a week at the gym.  ? Do you have a living will? No.  ? Do you have a DNR form? No.                                  If not, do you want to discuss one?  ? Do you have signed POA/HPOA forms?  No.                      If so, please bring to you appointment  ?   ? Do you have any difficulty bathing or dressing yourself? No.  ? Do you have any difficulty preparing food or eating?  No.  ? Do you have any difficulty managing your medications? No.  ? Do you have any difficulty managing your finances? No.  ? Do you have any difficulty affording your medications? No.  ? ?Social Determinant

## 2021-06-04 NOTE — Patient Instructions (Signed)

## 2021-07-17 ENCOUNTER — Ambulatory Visit: Payer: Federal, State, Local not specified - PPO | Admitting: Family

## 2021-07-27 ENCOUNTER — Other Ambulatory Visit: Payer: Self-pay | Admitting: Family

## 2021-07-27 NOTE — Telephone Encounter (Signed)
High risk warning populated when attempting to approve refill request. Will send to Ngetich, Dinah C, NP to review.   

## 2021-09-18 ENCOUNTER — Other Ambulatory Visit: Payer: Self-pay | Admitting: Family

## 2021-09-18 NOTE — Telephone Encounter (Signed)
High risk or very high risk warning populated when attempting to refill medication. RX request sent to PCP for review and approval if warranted.   

## 2021-09-23 ENCOUNTER — Ambulatory Visit (INDEPENDENT_AMBULATORY_CARE_PROVIDER_SITE_OTHER): Payer: Medicare Other | Admitting: Family

## 2021-09-23 ENCOUNTER — Encounter: Payer: Self-pay | Admitting: Family

## 2021-09-23 VITALS — BP 100/70 | HR 75 | Temp 97.2°F | Resp 16 | Ht 59.0 in | Wt 156.4 lb

## 2021-09-23 DIAGNOSIS — Z1272 Encounter for screening for malignant neoplasm of vagina: Secondary | ICD-10-CM

## 2021-09-23 DIAGNOSIS — G8929 Other chronic pain: Secondary | ICD-10-CM | POA: Diagnosis not present

## 2021-09-23 DIAGNOSIS — J4520 Mild intermittent asthma, uncomplicated: Secondary | ICD-10-CM | POA: Diagnosis not present

## 2021-09-23 DIAGNOSIS — Z0000 Encounter for general adult medical examination without abnormal findings: Secondary | ICD-10-CM

## 2021-09-23 DIAGNOSIS — R7303 Prediabetes: Secondary | ICD-10-CM

## 2021-09-23 DIAGNOSIS — K219 Gastro-esophageal reflux disease without esophagitis: Secondary | ICD-10-CM

## 2021-09-23 DIAGNOSIS — N182 Chronic kidney disease, stage 2 (mild): Secondary | ICD-10-CM | POA: Diagnosis not present

## 2021-09-23 DIAGNOSIS — E782 Mixed hyperlipidemia: Secondary | ICD-10-CM

## 2021-09-23 DIAGNOSIS — M542 Cervicalgia: Secondary | ICD-10-CM | POA: Diagnosis not present

## 2021-09-23 DIAGNOSIS — J3089 Other allergic rhinitis: Secondary | ICD-10-CM

## 2021-09-23 DIAGNOSIS — E781 Pure hyperglyceridemia: Secondary | ICD-10-CM | POA: Diagnosis not present

## 2021-09-23 DIAGNOSIS — I10 Essential (primary) hypertension: Secondary | ICD-10-CM | POA: Diagnosis not present

## 2021-09-23 MED ORDER — LACTULOSE 10 GM/15ML PO SOLN: 10.0000 g | Freq: Three times a day (TID) | ORAL | 0 refills | Status: DC

## 2021-09-23 MED ORDER — MONTELUKAST SODIUM 10 MG PO TABS: 10.0000 mg | ORAL_TABLET | Freq: Every day | ORAL | 0 refills | Status: DC

## 2021-09-23 MED ORDER — ALBUTEROL SULFATE HFA 108 (90 BASE) MCG/ACT IN AERS: INHALATION_SPRAY | RESPIRATORY_TRACT | 5 refills | Status: DC

## 2021-09-23 MED ORDER — FLUTICASONE PROPIONATE 50 MCG/ACT NA SUSP: NASAL | 3 refills | Status: DC

## 2021-09-23 MED ORDER — ASCORBIC ACID 500 MG PO TABS
500.0000 mg | ORAL_TABLET | Freq: Every day | ORAL | 0 refills | Status: AC
Start: 2021-09-23 — End: 2022-03-22

## 2021-09-23 MED ORDER — HYDRALAZINE HCL 25 MG PO TABS: 25.0000 mg | ORAL_TABLET | Freq: Three times a day (TID) | ORAL | 0 refills | Status: DC

## 2021-09-23 MED ORDER — PREDNISONE 20 MG PO TABS: ORAL_TABLET | ORAL | 0 refills | Status: AC

## 2021-09-23 MED ORDER — FAMOTIDINE 40 MG PO TABS
40.0000 mg | ORAL_TABLET | Freq: Two times a day (BID) | ORAL | 0 refills | Status: DC
Start: 2021-09-23 — End: 2021-12-03

## 2021-09-23 MED ORDER — ROSUVASTATIN CALCIUM 40 MG PO TABS: 40.0000 mg | ORAL_TABLET | Freq: Every day | ORAL | 0 refills | Status: DC

## 2021-09-23 MED ORDER — OMEPRAZOLE 40 MG PO CPDR: 40.0000 mg | DELAYED_RELEASE_CAPSULE | Freq: Every day | ORAL | 0 refills | Status: DC

## 2021-09-23 MED ORDER — DILTIAZEM HCL ER COATED BEADS 240 MG PO CP24: 240.0000 mg | ORAL_CAPSULE | Freq: Every day | ORAL | 0 refills | Status: DC

## 2021-09-23 MED ORDER — POTASSIUM CHLORIDE ER 10 MEQ PO TBCR
EXTENDED_RELEASE_TABLET | ORAL | 1 refills | Status: DC
Start: 2021-09-23 — End: 2021-11-23

## 2021-09-23 MED ORDER — DILTIAZEM HCL ER COATED BEADS 120 MG PO CP24: 120.0000 mg | ORAL_CAPSULE | Freq: Every morning | ORAL | 0 refills | Status: DC

## 2021-09-23 MED ORDER — CARVEDILOL 12.5 MG PO TABS: 12.5000 mg | ORAL_TABLET | Freq: Two times a day (BID) | ORAL | 0 refills | Status: DC

## 2021-09-23 MED ORDER — CLONIDINE HCL 0.1 MG PO TABS: 0.1000 mg | ORAL_TABLET | Freq: Every day | ORAL | 0 refills | Status: DC

## 2021-09-23 MED ORDER — BACLOFEN 10 MG PO TABS: ORAL_TABLET | ORAL | 0 refills | Status: DC

## 2021-09-23 MED ORDER — AMOXICILLIN-POT CLAVULANATE 875-125 MG PO TABS: 1.0000 | ORAL_TABLET | Freq: Two times a day (BID) | ORAL | 0 refills | Status: DC

## 2021-09-23 MED ORDER — AMILORIDE HCL 5 MG PO TABS: 5.0000 mg | ORAL_TABLET | Freq: Every day | ORAL | 0 refills | Status: DC

## 2021-09-23 NOTE — Progress Notes (Signed)
Provider: Marlowe Sax FNP-C   Ayaana Biondo, Nelda Bucks, NP  Patient Care Team: Kaila Devries, Nelda Bucks, NP as PCP - General (Family Medicine) Richardo Priest, MD as Referring Physician (Cardiology) Jamal Maes, MD as Consulting Physician (Nephrology)  Extended Emergency Contact Information Primary Emergency Contact: Mares,Sotero Address: [address]          [place] [address] [place] of [place] Phone: [phone] Mobile Phone: [phone] Relation: Spouse Secondary Emergency Contact: [place] Mobile Phone: [phone] Relation: Daughter  Code Status:  Full Code  Goals of care: Advanced Directive information    09/23/2021    9:05 AM  Advanced Directives  Does Patient Have a Medical Advance Directive? No  Would patient like information on creating a medical advance directive? No - Patient declined     Chief Complaint  Patient presents with   Annual Exam    PHYSICAL   Health Maintenance    Discuss the need for Dexa scan, Pap smear, and Mammogram.   Immunizations    Discuss the need for Tetanus vaccine, Influenza vaccine, Covid Booster, 2nd Shingrix vaccine.     HPI:  Pt is a 66 y.o. female seen today for physical exam and medical management of chronic diseases. She denies any acute issues this visit. Has a medical history of Hypertension,GERD,Chronic neck pain,chronic allergies ,mild Asthma ,Hyperlipidemia,CKD stage 3,Obesity,Generalized anxiety disorder,Osteoarthritis,Prediabetes among others.  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 Has upcoming appointment for mammogram.   Past Medical History:  Diagnosis Date   Acute maxillary sinusitis 11/12/2015   Allergy    Anxiety state 05/17/2011   AR (allergic rhinitis) 02/17/2012   Asthma    BMI 31.0-31.9,adult 03/23/2011    Cough 03/23/2011   Dental disease 02/17/2012   [place] in [place] 01/2012    Essential hypertension, benign 03/23/2011   GERD (gastroesophageal reflux disease) 05/17/2011   H/O varicella    History of measles, mumps, or rubella    Hyperlipemia 03/23/2011   Hyperlipidemia    Hypertension    Hypertensive disorder 09/30/2016   Insomnia 05/17/2011   Mid back pain 02/17/2012   Neck pain 02/17/2012   Obesity 09/30/2016   Pelvic floor relaxation 09/30/2016   RAD (reactive airway disease) 02/17/2012   Past Surgical History:  Procedure Laterality Date   REPLACEMENT TOTAL KNEE Right    TOTAL KNEE ARTHROPLASTY Left 01/07/2020   Procedure: TOTAL KNEE ARTHROPLASTY;  Surgeon: Gaynelle Arabian, MD;  Location: [facility];  Service: Orthopedics;  Laterality: Left;  8mn   TUBAL LIGATION     VAGINAL HYSTERECTOMY     uterine prolapse; DUB; ovaries intact.  [place]    Allergies  Allergen Reactions   Ace Inhibitors Cough   Iodinated Contrast Media Hives    Chills   Lisinopril Cough   Sulfa Antibiotics Hives   Iodine Hives   Red Dye    Tape     Surgical tape causes itching   Yellow Dye    Bactrim Rash   Flexeril [Cyclobenzaprine Hcl] Nausea Only   Latex Itching   Naprosyn [Naproxen] Nausea And Vomiting    And high doses of NSAIDS   Sulfamethoxazole-Trimethoprim Rash    Allergies as of 09/23/2021       Reactions   Ace Inhibitors Cough   Iodinated Contrast Media Hives  Chills   Lisinopril Cough   Sulfa Antibiotics Hives   Iodine Hives   Red Dye    Tape    Surgical tape causes itching   Yellow Dye    Bactrim Rash   Flexeril [cyclobenzaprine Hcl] Nausea Only   Latex Itching   Naprosyn [naproxen] Nausea And Vomiting   And high doses of NSAIDS   Sulfamethoxazole-trimethoprim Rash        Medication List        Accurate as of September 23, 2021 10:37 AM. If you have any questions, ask your nurse or doctor.          albuterol 108 (90 Base) MCG/ACT inhaler Commonly known as:  Proventil HFA INHALE 2 PUFFS INTO THE LUNGS EVERY 6 HOURS AS NEEDED FOR WHEEZING OR SHORTNESS OF BREATH   aMILoride 5 MG tablet Commonly known as: MIDAMOR Take 1 tablet (5 mg total) by mouth daily.   amoxicillin-clavulanate 875-125 MG tablet Commonly known as: AUGMENTIN Take 1 tablet by mouth 2 (two) times daily.   ascorbic acid 500 MG tablet Commonly known as: VITAMIN C Take 1 tablet (500 mg total) by mouth daily.   baclofen 10 MG tablet Commonly known as: LIORESAL TAKE 1 TABLET BY MOUTH 3 TIMES A DAY AS NEEDED FOR MUSCLE RELAXANT FOR NECK   carvedilol 12.5 MG tablet Commonly known as: COREG Take 1 tablet (12.5 mg total) by mouth 2 (two) times daily.   cloNIDine 0.1 MG tablet Commonly known as: CATAPRES Take 1 tablet (0.1 mg total) by mouth at bedtime.   diazepam 10 MG tablet Commonly known as: VALIUM TAKE 1 TABLET BY MOUTH EVERY DAY AS NEEDED FOR MUSCLE SPASM   diltiazem 240 MG 24 hr capsule Commonly known as: CARDIZEM CD Take 1 capsule (240 mg total) by mouth at bedtime.   diltiazem 120 MG 24 hr capsule Commonly known as: CARDIZEM CD Take 1 capsule (120 mg total) by mouth every morning.   famotidine 40 MG tablet Commonly known as: PEPCID Take 1 tablet (40 mg total) by mouth 2 (two) times daily.   fluticasone 50 MCG/ACT nasal spray Commonly known as: FLONASE PLACE 2 SPRAYS IN THE NOSTRIL EVERY DAY   hydrALAZINE 25 MG tablet Commonly known as: APRESOLINE Take 1 tablet (25 mg total) by mouth 3 (three) times daily.   ibuprofen 800 MG tablet Commonly known as: ADVIL Take 1 tablet (800 mg total) by mouth in the morning, at noon, and at bedtime.   lactulose 10 GM/15ML solution Commonly known as: CHRONULAC Take 15 mLs (10 g total) by mouth 3 (three) times daily.   montelukast 10 MG tablet Commonly known as: SINGULAIR Take 1 tablet (10 mg total) by mouth at bedtime.   omeprazole 40 MG capsule Commonly known as: PRILOSEC Take 1 capsule (40 mg total) by mouth  daily. What changed: Another medication with the same name was removed. Continue taking this medication, and follow the directions you see here. Changed by: Sandrea Hughs, NP   potassium chloride 10 MEQ tablet Commonly known as: Klor-Con 10 TAKE 3 TABLETS BY MOUTH EVERY DAY   predniSONE 20 MG tablet Commonly known as: DELTASONE Take 3 tablets (60 mg total) by mouth daily with breakfast for 1 day, THEN 2 tablets (40 mg total) daily with breakfast for 1 day, THEN 1 tablet (20 mg total) daily with breakfast for 1 day, THEN 0.5 tablets (10 mg total) daily with breakfast for 1 day. Start taking on: September 23, 2021 What changed: See  the new instructions. Changed by: Sandrea Hughs, NP   rosuvastatin 40 MG tablet Commonly known as: CRESTOR Take 1 tablet (40 mg total) by mouth daily.   traMADol 50 MG tablet Commonly known as: ULTRAM TAKE 1 TABLET BY MOUTH EVERY 12 HOURS AS NEEDED FOR MODERATE PAIN   zinc sulfate 220 (50 Zn) MG capsule Take 1 capsule (220 mg total) by mouth daily.        Review of Systems  Constitutional:  Negative for appetite change, chills, fatigue, fever and unexpected weight change.  HENT:  Positive for congestion, rhinorrhea and sore throat. Negative for dental problem, ear discharge, ear pain, facial swelling, hearing loss, nosebleeds, postnasal drip, sinus pressure, sinus pain, sneezing, tinnitus and trouble swallowing.   Eyes:  Negative for pain, discharge, redness, itching and visual disturbance.  Respiratory:  Negative for cough, chest tightness, shortness of breath and wheezing.   Cardiovascular:  Negative for chest pain, palpitations and leg swelling.  Gastrointestinal:  Negative for abdominal distention, abdominal pain, blood in stool, constipation, diarrhea, nausea and vomiting.  Endocrine: Negative for cold intolerance, heat intolerance, polydipsia, polyphagia and polyuria.  Genitourinary:  Negative for difficulty urinating, dysuria, flank pain,  frequency and urgency.  Musculoskeletal:  Negative for arthralgias, back pain, gait problem, joint swelling, myalgias, neck pain and neck stiffness.  Skin:  Negative for color change, pallor, rash and wound.  Neurological:  Negative for dizziness, syncope, speech difficulty, weakness, light-headedness, numbness and headaches.  Hematological:  Does not bruise/bleed easily.  Psychiatric/Behavioral:  Negative for agitation, behavioral problems, confusion, hallucinations, self-injury, sleep disturbance and suicidal ideas. The patient is not nervous/anxious.     Immunization History  Administered Date(s) Administered   Influenza,inj,Quad PF,6+ Mos 12/14/2018, 10/18/2019   Influenza,inj,quad, With Preservative 11/16/2014, 11/16/2018   Influenza-Unspecified 11/13/2014, 12/17/2015, 12/16/2016   Moderna Sars-Covid-2 Vaccination 02/26/2019, 03/24/2019, 01/03/2020, 07/05/2020   PFIZER(Purple Top)SARS-COV-2 Vaccination 11/19/2020   PNEUMOCOCCAL CONJUGATE-20 02/02/2021   Td 12/17/2006   Zoster Recombinat (Shingrix) 02/02/2021   Pertinent  Health Maintenance Due  Topic Date Due   PAP SMEAR-Modifier  11/14/2019   DEXA SCAN  Never done   MAMMOGRAM  08/30/2021   INFLUENZA VACCINE  09/15/2021   COLONOSCOPY (Pts 45-55yr Insurance coverage will need to be confirmed)  03/29/2029      04/18/2020    1:11 PM 09/29/2020    8:24 AM 11/27/2020   10:42 AM 12/25/2020   10:05 AM 09/23/2021    9:04 AM  Fall Risk  Falls in the past year? 0 0 0 0 0  Was there an injury with Fall?  0 0 0 0  Fall Risk Category Calculator  0 0 0 0  Fall Risk Category  Low Low Low Low  Patient Fall Risk Level  Low fall risk Low fall risk Low fall risk Low fall risk  Patient at Risk for Falls Due to  No Fall Risks No Fall Risks No Fall Risks No Fall Risks  Fall risk Follow up  Falls evaluation completed Falls evaluation completed Falls evaluation completed Falls evaluation completed   Functional Status Survey:    Vitals:    09/23/21 0901  BP: 100/70  Pulse: 75  Resp: 16  Temp: (!) 97.2 F (36.2 C)  SpO2: 97%  Weight: 156 lb 6.4 oz (70.9 kg)  Height: '4\' 11"'  (1.499 m)   Body mass index is 31.59 kg/m. Physical Exam Vitals reviewed. Exam conducted with a chaperone present (Dellis Filbert.  Constitutional:      General: She is  not in acute distress.    Appearance: Normal appearance. She is normal weight. She is not ill-appearing or diaphoretic.  HENT:     Head: Normocephalic.     Right Ear: Tympanic membrane, ear canal and external ear normal. There is no impacted cerumen.     Left Ear: Tympanic membrane, ear canal and external ear normal. There is no impacted cerumen.     Nose: Nose normal. No congestion or rhinorrhea.     Mouth/Throat:     Mouth: Mucous membranes are moist.     Pharynx: Posterior oropharyngeal erythema present. No oropharyngeal exudate.  Eyes:     General: No scleral icterus.       Right eye: No discharge.        Left eye: No discharge.     Extraocular Movements: Extraocular movements intact.     Conjunctiva/sclera: Conjunctivae normal.     Pupils: Pupils are equal, round, and reactive to light.  Neck:     Vascular: No carotid bruit.  Cardiovascular:     Rate and Rhythm: Normal rate and regular rhythm.     Pulses: Normal pulses.     Heart sounds: Normal heart sounds. No murmur heard.    No friction rub. No gallop.  Pulmonary:     Effort: Pulmonary effort is normal. No respiratory distress.     Breath sounds: Normal breath sounds. No wheezing, rhonchi or rales.  Chest:     Chest wall: No tenderness.  Abdominal:     General: Bowel sounds are normal. There is no distension.     Palpations: Abdomen is soft. There is no mass.     Tenderness: There is no abdominal tenderness. There is no right CVA tenderness, left CVA tenderness, guarding or rebound.     Hernia: There is no hernia in the left inguinal area or right inguinal area.  Genitourinary:    Exam position:  Lithotomy position.     Labia:        Right: No rash, tenderness or lesion.        Left: No rash, tenderness or lesion.      Vagina: Normal.     Adnexa: Right adnexa normal and left adnexa normal.     Comments: No cervix vaginal wall swabbed  Musculoskeletal:        General: No swelling or tenderness. Normal range of motion.     Cervical back: Normal range of motion. No rigidity or tenderness.     Right lower leg: No edema.     Left lower leg: No edema.  Lymphadenopathy:     Cervical: No cervical adenopathy.     Lower Body: No right inguinal adenopathy. No left inguinal adenopathy.  Skin:    General: Skin is warm and dry.     Coloration: Skin is not pale.     Findings: No bruising, erythema, lesion or rash.  Neurological:     Mental Status: She is alert and oriented to person, place, and time.     Cranial Nerves: No cranial nerve deficit.     Sensory: No sensory deficit.     Motor: No weakness.     Coordination: Coordination normal.     Gait: Gait normal.  Psychiatric:        Mood and Affect: Mood normal.        Speech: Speech normal.        Behavior: Behavior normal.        Thought Content: Thought content normal.  Judgment: Judgment normal.     Labs reviewed: Recent Labs    09/29/20 0807 06/01/21 1350  NA 142 143  K 4.5 4.2  CL 110 111*  CO2 25 20  GLUCOSE 89 80  BUN 15 30*  CREATININE 0.72 0.77  CALCIUM 8.9 9.2   Recent Labs    09/29/20 0807 06/01/21 1350  AST 18 18  ALT 18 15  ALKPHOS  --  78  BILITOT 0.2 <0.2  PROT 6.3 6.6  ALBUMIN  --  4.3   Recent Labs    09/29/20 0807  WBC 4.6  NEUTROABS 2,116  HGB 12.5  HCT 40.7  MCV 83.1  PLT 260   Lab Results  Component Value Date   TSH 1.83 09/29/2020   Lab Results  Component Value Date   HGBA1C 6.0 (H) 06/01/2021   Lab Results  Component Value Date   CHOL 161 06/01/2021   HDL 69 06/01/2021   LDLCALC 66 06/01/2021   TRIG 157 (H) 06/01/2021   CHOLHDL 2.3 06/01/2021    Significant  Diagnostic Results in last 30 days:  No results found.  Assessment/Plan 1. Screening for vaginal cancer No cervix vaginal wall swabbed  - PAP, Image Guided [[facility]]  2. Annual physical exam  Up to date with immunization except advised to get her shingles and COVID-19 booster vaccine at the pharmacy. Medication and labs reviewed patient counselled regarding yearly exam, prevention of dental and periodontal disease, diet, regular sustained exercise for at least 30 minutes x 3 /week, recommended fasting routine labs.  3. Essential hypertension B/p well controlled  - continue current medication  - TSH - COMPLETE METABOLIC PANEL WITH GFR - CBC with Differential/Platelet - hydrALAZINE (APRESOLINE) 25 MG tablet; Take 1 tablet (25 mg total) by mouth 3 (three) times daily.  Dispense: 540 tablet; Refill: 0 - diltiazem (CARDIZEM CD) 240 MG 24 hr capsule; Take 1 capsule (240 mg total) by mouth at bedtime.  Dispense: 180 capsule; Refill: 0 - diltiazem (CARDIZEM CD) 120 MG 24 hr capsule; Take 1 capsule (120 mg total) by mouth every morning.  Dispense: 180 capsule; Refill: 0 - cloNIDine (CATAPRES) 0.1 MG tablet; Take 1 tablet (0.1 mg total) by mouth at bedtime.  Dispense: 180 tablet; Refill: 0 - carvedilol (COREG) 12.5 MG tablet; Take 1 tablet (12.5 mg total) by mouth 2 (two) times daily.  Dispense: 360 tablet; Refill: 0  4. Gastroesophageal reflux disease without esophagitis Symptoms controlled. H/H stable.No tarry or black stool  - advised to avoid eating meals late in the evening and to avoid aggravating foods and spices. - continue on Omeprazole  - omeprazole (PRILOSEC) 40 MG capsule; Take 1 capsule (40 mg total) by mouth daily.  Dispense: 180 capsule; Refill: 0 - famotidine (PEPCID) 40 MG tablet; Take 1 tablet (40 mg total) by mouth 2 (two) times daily.  Dispense: 180 tablet; Refill: 0  5. Mild intermittent asthma without complication Symptoms controlled  - montelukast (SINGULAIR) 10 MG  tablet; Take 1 tablet (10 mg total) by mouth at bedtime.  Dispense: 180 tablet; Refill: 0 - albuterol (PROVENTIL HFA) 108 (90 Base) MCG/ACT inhaler; INHALE 2 PUFFS INTO THE LUNGS EVERY 6 HOURS AS NEEDED FOR WHEEZING OR SHORTNESS OF BREATH  Dispense: 6.7 each; Refill: 5  6. Non-seasonal allergic rhinitis, unspecified trigger Continue on flonase  - fluticasone (FLONASE) 50 MCG/ACT nasal spray; PLACE 2 SPRAYS IN THE NOSTRIL EVERY DAY  Dispense: 48 g; Refill: 3  7. CKD (chronic kidney disease), stage II Continue to  avoid Nephrotoxins  - will recheck CMP  8. Prediabetes - Hgb A1 C 6.0 (H) 06/01/2021 - dietary modification and exercise advised  - Hemoglobin A1c  9.  Mixed hyperlipidemia Dietary modification as above  - Lipid panel - rosuvastatin (CRESTOR) 40 MG tablet; Take 1 tablet (40 mg total) by mouth daily.  Dispense: 180 tablet; Refill: 0  10 Chronic neck pain Continue current regimen - baclofen (LIORESAL) 10 MG tablet; TAKE 1 TABLET BY MOUTH 3 TIMES A DAY AS NEEDED FOR MUSCLE RELAXANT FOR NECK  Dispense: 180 tablet; Refill: 0  Family/ staff Communication: Reviewed plan of care with patient verbalized understanding   Labs/tests ordered: - CBC with Differential/Platelet - CMP with eGFR([facility]) - TSH - Hgb A1C - Lipid panel  Next Appointment : Return in about 1 year (around 09/24/2022) for medical mangement of chronic issues.Discuss lab results on 10/02/2021 .   Sandrea Hughs, NP

## 2021-09-24 LAB — LIPID PANEL
Cholesterol: 164 mg/dL (ref <200)
HDL: 65 mg/dL (ref >=50)
LDL Cholesterol (Calc): 78 mg/dL (calc)
Non-HDL Cholesterol (Calc): 99 mg/dL (calc) (ref <130)
Total CHOL/HDL Ratio: 2.5 (calc) (ref <5.0)
Triglycerides: 133 mg/dL (ref <150)

## 2021-09-24 LAB — COMPLETE METABOLIC PANEL WITH GFR
AG Ratio: 1.7 (calc) (ref 1.0–2.5)
ALT: 11 U/L (ref 6–29)
AST: 14 U/L (ref 10–35)
Albumin: 4 g/dL (ref 3.6–5.1)
Alkaline phosphatase (APISO): 75 U/L (ref 37–153)
BUN: 23 mg/dL (ref 7–25)
CO2: 22 mmol/L (ref 20–32)
Calcium: 9 mg/dL (ref 8.6–10.4)
Chloride: 110 mmol/L (ref 98–110)
Creat: 0.92 mg/dL (ref 0.50–1.05)
Globulin: 2.4 g/dL (calc) (ref 1.9–3.7)
Glucose, Bld: 103 mg/dL — ABNORMAL HIGH (ref 65–99)
Potassium: 4.5 mmol/L (ref 3.5–5.3)
Sodium: 142 mmol/L (ref 135–146)
Total Bilirubin: 0.2 mg/dL (ref 0.2–1.2)
Total Protein: 6.4 g/dL (ref 6.1–8.1)
eGFR: 69 mL/min/{1.73_m2} (ref >=60)

## 2021-09-24 LAB — HEMOGLOBIN A1C
Hgb A1c MFr Bld: 5.6 % of total Hgb (ref <5.7)
Mean Plasma Glucose: 114 mg/dL
eAG (mmol/L): 6.3 mmol/L

## 2021-09-24 LAB — CBC WITH DIFFERENTIAL/PLATELET
Absolute Monocytes: 410 cells/uL (ref 200–950)
Basophils Absolute: 70 cells/uL (ref 0–200)
Basophils Relative: 1.3 %
Eosinophils Absolute: 248 cells/uL (ref 15–500)
Eosinophils Relative: 4.6 %
HCT: 38.4 % (ref 35.0–45.0)
Hemoglobin: 12.5 g/dL (ref 11.7–15.5)
Lymphs Abs: 1512 cells/uL (ref 850–3900)
MCH: 27.1 pg (ref 27.0–33.0)
MCHC: 32.6 g/dL (ref 32.0–36.0)
MCV: 83.1 fL (ref 80.0–100.0)
MPV: 10.5 fL (ref 7.5–12.5)
Monocytes Relative: 7.6 %
Neutro Abs: 3159 cells/uL (ref 1500–7800)
Neutrophils Relative %: 58.5 %
Platelets: 247 10*3/uL (ref 140–400)
RBC: 4.62 10*6/uL (ref 3.80–5.10)
RDW: 13.6 % (ref 11.0–15.0)
Total Lymphocyte: 28 %
WBC: 5.4 10*3/uL (ref 3.8–10.8)

## 2021-09-24 LAB — TSH: TSH: 1.72 mIU/L (ref 0.40–4.50)

## 2021-09-25 LAB — PAP IG (IMAGE GUIDED)

## 2021-09-26 DIAGNOSIS — Z1231 Encounter for screening mammogram for malignant neoplasm of breast: Secondary | ICD-10-CM | POA: Diagnosis not present

## 2021-10-02 ENCOUNTER — Ambulatory Visit: Payer: Medicare Other | Admitting: Family

## 2021-10-29 DIAGNOSIS — Z96651 Presence of right artificial knee joint: Secondary | ICD-10-CM | POA: Diagnosis not present

## 2021-10-29 DIAGNOSIS — Z96652 Presence of left artificial knee joint: Secondary | ICD-10-CM | POA: Diagnosis not present

## 2021-11-10 ENCOUNTER — Other Ambulatory Visit: Payer: Self-pay | Admitting: Family

## 2021-11-10 DIAGNOSIS — G8929 Other chronic pain: Secondary | ICD-10-CM

## 2021-11-22 NOTE — Progress Notes (Unsigned)
Cardiology Office Note:    Date:  11/23/2021   ID:  Brooke Wilson, DOB 01-Mar-1955, MRN [id number]  PCP:  Caesar Bookman, NP  Cardiologist:  Norman Herrlich, MD    Referring MD: Caesar Bookman, NP    ASSESSMENT:    1. Resistant hypertension   2. Mixed hyperlipidemia   3. Sinusitis, unspecified chronicity, unspecified location    PLAN:    In order of problems listed above:  Hypertension is markedly improved now well controlled on a multidrug regimen including her calcium channel blocker beta-blocker essentially active clonidine and amiloride is a distal diuretic continue the same reduced dose hydralazine and reduce her potassium supplements Continue her statin LDL at target Augmentin renewed   Next appointment: 1 year she said she will travel to the [place] office to see me   Medication Adjustments/Labs and Tests Ordered: Current medicines are reviewed at length with the patient today.  Concerns regarding medicines are outlined above.  No orders of the defined types were placed in this encounter.  No orders of the defined types were placed in this encounter.   Chief Complaint  Patient presents with   Follow-up   Hypertension    History of Present Illness:    Brooke Wilson is a 66 y.o. female with a hx of resistant hypertension hyperlipidemia and prediabetes last seen 06/04/2021 with remarkable improvement after starting MRA.Marland Kitchen  Compliance with diet, lifestyle and medications: Yes  She continues to do well she has cut her hydralazine to 2 times a day.  She was unaware her potassium was borderline elevated and reduce her potassium supplements  Most recent labs 09/23/2021 cholesterol 164 LDL 78 A1c 5.6% hemoglobin 12.5 creatinine 0.9 potassium 4.5  No chest pain edema shortness of breath palpitation or syncope  She has a history of recurrent sinusitis is beginning to have the same symptom complex request prescription and I will give her from Augmentin  No. 20 twice daily Past Medical History:  Diagnosis Date   Acute maxillary sinusitis 11/12/2015   Allergy    Anxiety state 05/17/2011   AR (allergic rhinitis) 02/17/2012   Asthma    BMI 31.0-31.9,adult 03/23/2011   Cough 03/23/2011   Dental disease 02/17/2012   [place] in [place] 01/2012    Essential hypertension, benign 03/23/2011   GERD (gastroesophageal reflux disease) 05/17/2011   H/O varicella    History of measles, mumps, or rubella    Hyperlipemia 03/23/2011   Hyperlipidemia    Hypertension    Hypertensive disorder 09/30/2016   Insomnia 05/17/2011   Mid back pain 02/17/2012   Neck pain 02/17/2012   Obesity 09/30/2016   Pelvic floor relaxation 09/30/2016   RAD (reactive airway disease) 02/17/2012    Past Surgical History:  Procedure Laterality Date   REPLACEMENT TOTAL KNEE Right    TOTAL KNEE ARTHROPLASTY Left 01/07/2020   Procedure: TOTAL KNEE ARTHROPLASTY;  Surgeon: Ollen Gross, MD;  Location: [facility];  Service: Orthopedics;  Laterality: Left;    TUBAL LIGATION     VAGINAL HYSTERECTOMY     uterine prolapse; DUB; ovaries intact.  [place]    Current Medications: Current Meds  Medication Sig   albuterol (PROVENTIL HFA) 108 (90 Base) MCG/ACT inhaler INHALE 2 PUFFS INTO THE LUNGS EVERY 6 HOURS AS NEEDED FOR WHEEZING OR SHORTNESS OF BREATH (Patient taking differently: 2 puffs every 6 (six) hours as needed for wheezing or shortness of breath. INHALE 2 PUFFS INTO THE LUNGS EVERY 6 HOURS AS NEEDED FOR WHEEZING  OR SHORTNESS OF BREATH)   aMILoride (MIDAMOR) 5 MG tablet Take 1 tablet (5 mg total) by mouth daily.   ascorbic acid (VITAMIN C) 500 MG tablet Take 1 tablet (500 mg total) by mouth daily.   baclofen (LIORESAL) 10 MG tablet TAKE 1 TABLET BY MOUTH 3 TIMES A DAY AS NEEDED FOR MUSCLE RELAXANT FOR NECK (Patient taking differently: Take 10 mg by mouth as needed for muscle spasms. TAKE 1 TABLET BY MOUTH 3 TIMES A DAY AS NEEDED FOR MUSCLE RELAXANT FOR NECK)   carvedilol (COREG) 12.5  MG tablet Take 1 tablet (12.5 mg total) by mouth 2 (two) times daily.   cloNIDine (CATAPRES) 0.1 MG tablet Take 1 tablet (0.1 mg total) by mouth at bedtime.   diazepam (VALIUM) 10 MG tablet TAKE 1 TABLET BY MOUTH EVERY DAY AS NEEDED FOR MUSCLE SPASM (Patient taking differently: Take 10 mg by mouth as needed (Muscle spasm). TAKE 1 TABLET BY MOUTH EVERY DAY AS NEEDED FOR MUSCLE SPASM)   diltiazem (CARDIZEM CD) 120 MG 24 hr capsule Take 1 capsule (120 mg total) by mouth every morning.   diltiazem (CARDIZEM CD) 240 MG 24 hr capsule Take 1 capsule (240 mg total) by mouth at bedtime.   famotidine (PEPCID) 40 MG tablet Take 1 tablet (40 mg total) by mouth 2 (two) times daily.   fluticasone (FLONASE) 50 MCG/ACT nasal spray PLACE 2 SPRAYS IN THE NOSTRIL EVERY DAY (Patient taking differently: Place 2 sprays into both nostrils daily. PLACE 2 SPRAYS IN THE NOSTRIL EVERY DAY)   hydrALAZINE (APRESOLINE) 25 MG tablet Take 1 tablet (25 mg total) by mouth 3 (three) times daily.   ibuprofen (ADVIL) 800 MG tablet Take 1 tablet (800 mg total) by mouth in the morning, at noon, and at bedtime.   lactulose (CHRONULAC) 10 GM/15ML solution Take 15 mLs (10 g total) by mouth 3 (three) times daily.   montelukast (SINGULAIR) 10 MG tablet Take 1 tablet (10 mg total) by mouth at bedtime.   omeprazole (PRILOSEC) 40 MG capsule Take 1 capsule (40 mg total) by mouth daily.   potassium chloride (KLOR-CON 10) 10 MEQ tablet TAKE 3 TABLETS BY MOUTH EVERY DAY (Patient taking differently: Take 10 mEq by mouth daily. TAKE 3 TABLETS BY MOUTH EVERY DAY)   rosuvastatin (CRESTOR) 40 MG tablet Take 1 tablet (40 mg total) by mouth daily.   traMADol (ULTRAM) 50 MG tablet TAKE 1 TABLET BY MOUTH EVERY 12 HOURS AS NEEDED FOR MODERATE PAIN (Patient taking differently: Take 50 mg by mouth every 12 (twelve) hours as needed for severe pain or moderate pain. TAKE 1 TABLET BY MOUTH EVERY 12 HOURS AS NEEDED FOR MODERATE PAIN)   zinc sulfate 220 (50 Zn) MG  capsule Take 1 capsule (220 mg total) by mouth daily.   [DISCONTINUED] amoxicillin-clavulanate (AUGMENTIN) 875-125 MG tablet Take 1 tablet by mouth 2 (two) times daily.     Allergies:   Ace inhibitors, Iodinated contrast media, Lisinopril, Sulfa antibiotics, Iodine, Red dye, Tape, Yellow dye, Bactrim, Flexeril [cyclobenzaprine hcl], Latex, Naprosyn [naproxen], and Sulfamethoxazole-trimethoprim   Social History   Socioeconomic History   Marital status: Married    Spouse name: Not on file   Number of children: Not on file   Years of education: Not on file   Highest education level: Not on file  Occupational History   Not on file  Tobacco Use   Smoking status: Never    Passive exposure: Never   Smokeless tobacco: Never  Vaping Use  Vaping Use: Never used  Substance and Sexual Activity   Alcohol use: No    Alcohol/week: 0.0 standard drinks of alcohol   Drug use: No   Sexual activity: Yes    Birth control/protection: Post-menopausal, Surgical    Comment: Hysterectomy  Other Topics Concern   Not on file  Social History Narrative   Marital status:  Married x 38 years; from the [place]; moved to [place] in 1987.      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      Advanced Directives: DNR/DNI; HCPOA: Sati Dondero-Enicole         Tobacco use, amount per day now: None.   Past tobacco use, amount per day: None.   How many years did you use tobacco: N/A   Alcohol use (drinks per week): N/A   Diet: Regular.   Do you drink/eat things with caffeine: No.   Marital status:  Married                                What year were you married? 1981   Do you live in a house, apartment, assisted living, condo, trailer, etc.? House.   Is it one or more stories?    How many persons live  in your home? 2   Do you have pets in your home?( please list) None.   Highest Level of education completed? Bachelors Degree in Nursing.   Current or past profession: RN   Do you exercise?     Yes                             Type and how often? 4 times a week at the gym.   Do you have a living will? No.   Do you have a DNR form? No.                                  If not, do you want to discuss one?   Do you have signed POA/HPOA forms?  No.                      If so, please bring to you appointment      Do you have any difficulty bathing or dressing yourself? No.   Do you have any difficulty preparing food or eating? No.   Do you have any difficulty managing your medications? No.   Do you have any difficulty managing your finances? No.   Do you have any difficulty affording your medications? No.   Social Determinants of Health   Financial Resource Strain: Not on file  Food Insecurity: Not on file  Transportation Needs: Not on file  Physical Activity: Not on file  Stress: Not on file  Social Connections: Not on file     Family History: The patient's family history includes Cancer in her maternal grandmother; Hypertension in her brother, mother, and sister; Stroke in her brother and mother; Stroke (age of  onset: 76) in her sister. ROS:   Please see the history of present illness.    All other systems reviewed and are negative.  EKGs/Labs/Other Studies Reviewed:    The following studies were reviewed today:    Recent Labs: 09/23/2021: ALT 11; BUN 23; Creat 0.92; Hemoglobin 12.5; Platelets 247; Potassium 4.5; Sodium 142; TSH 1.72  Recent Lipid Panel    Component Value Date/Time   CHOL 164 09/23/2021 1018   CHOL 161 06/01/2021 1350   TRIG 133 09/23/2021 1018   HDL 65 09/23/2021 1018   HDL 69 06/01/2021 1350   CHOLHDL 2.5 09/23/2021 1018   VLDL 35 (H) 02/17/2015 1956   LDLCALC 78 09/23/2021 1018    Physical Exam:    VS:  BP 122/74   Pulse 71   Ht 4\' 11"  (1.499 m)    Wt 159 lb 12.8 oz (72.5 kg)   SpO2 95%   BMI 32.28 kg/m     Wt Readings from Last 3 Encounters:  11/23/21 159 lb 12.8 oz (72.5 kg)  09/23/21 156 lb 6.4 oz (70.9 kg)  06/04/21 156 lb 1.9 oz (70.8 kg)     GEN:  Well nourished, well developed in no acute distress HEENT: Normal NECK: No JVD; No carotid bruits LYMPHATICS: No lymphadenopathy CARDIAC: RRR, no murmurs, rubs, gallops RESPIRATORY:  Clear to auscultation without rales, wheezing or rhonchi  ABDOMEN: Soft, non-tender, non-distended MUSCULOSKELETAL:  No edema; No deformity  SKIN: Warm and dry NEUROLOGIC:  Alert and oriented x 3 PSYCHIATRIC:  Normal affect    Signed, Shirlee More, MD  11/23/2021 9:52 AM    [place]

## 2021-11-23 ENCOUNTER — Ambulatory Visit: Payer: Medicare Other | Attending: Cardiology | Admitting: Cardiology

## 2021-11-23 VITALS — BP 122/74 | HR 71 | Ht 59.0 in | Wt 159.8 lb

## 2021-11-23 DIAGNOSIS — J329 Chronic sinusitis, unspecified: Secondary | ICD-10-CM | POA: Diagnosis not present

## 2021-11-23 DIAGNOSIS — I1A0 Resistant hypertension: Secondary | ICD-10-CM | POA: Diagnosis not present

## 2021-11-23 DIAGNOSIS — E782 Mixed hyperlipidemia: Secondary | ICD-10-CM

## 2021-11-23 MED ORDER — POTASSIUM CHLORIDE CRYS ER 20 MEQ PO TBCR: 20.0000 meq | EXTENDED_RELEASE_TABLET | Freq: Every day | ORAL | 3 refills | Status: DC

## 2021-11-23 MED ORDER — AMOXICILLIN-POT CLAVULANATE 875-125 MG PO TABS: 1.0000 | ORAL_TABLET | Freq: Two times a day (BID) | ORAL | 0 refills | Status: DC

## 2021-11-23 NOTE — Patient Instructions (Signed)
Medication Instructions:  Your physician has recommended you make the following change in your medication:   Decrease your Potassium to 20 mEq daily.  *If you need a refill on your cardiac medications before your next appointment, please call your pharmacy*   Lab Work: None ordered If you have labs (blood work) drawn today and your tests are completely normal, you will receive your results only by: [place] (if you have MyChart) OR A paper copy in the mail If you have any lab test that is abnormal or we need to change your treatment, we will call you to review the results.   Testing/Procedures: None ordered   Follow-Up: At [facility], you and your health needs are our priority.  As part of our continuing mission to provide you with exceptional heart care, we have created designated Provider Care Teams.  These Care Teams include your primary Cardiologist (physician) and Advanced Practice Providers (APPs -  Physician Assistants and Nurse Practitioners) who all work together to provide you with the care you need, when you need it.  We recommend signing up for the patient portal called "MyChart".  Sign up information is provided on this After Visit Summary.  MyChart is used to connect with patients for Virtual Visits (Telemedicine).  Patients are able to view lab/test results, encounter notes, upcoming appointments, etc.  Non-urgent messages can be sent to your provider as well.   To learn more about what you can do with MyChart, go to [email or web address].    Your next appointment:   12 month(s)  The format for your next appointment:   In Person  Provider:   Shirlee More, MD   Other Instructions NA

## 2021-11-23 NOTE — Addendum Note (Signed)
Addended by: Truddie Hidden on: 11/23/2021 10:06 AM   Modules accepted: Orders

## 2021-12-03 ENCOUNTER — Other Ambulatory Visit: Payer: Self-pay | Admitting: *Deleted

## 2021-12-03 DIAGNOSIS — J4520 Mild intermittent asthma, uncomplicated: Secondary | ICD-10-CM

## 2021-12-03 DIAGNOSIS — K219 Gastro-esophageal reflux disease without esophagitis: Secondary | ICD-10-CM

## 2021-12-03 DIAGNOSIS — E782 Mixed hyperlipidemia: Secondary | ICD-10-CM

## 2021-12-03 DIAGNOSIS — I10 Essential (primary) hypertension: Secondary | ICD-10-CM

## 2021-12-03 NOTE — Telephone Encounter (Signed)
Patient walked into office and stated that Webb Silversmith was going to refill all of her medications for #90 because she is going to the [place] and will not be back until Thanksgiving.  Stated that she is leaving tomorrow and needs her Rx's.   Pended and sent to [facility] for approval due to Webb Silversmith out of office.

## 2021-12-04 MED ORDER — ALBUTEROL SULFATE HFA 108 (90 BASE) MCG/ACT IN AERS: INHALATION_SPRAY | RESPIRATORY_TRACT | 1 refills | Status: DC

## 2021-12-04 MED ORDER — HYDROXYZINE HCL 50 MG PO TABS: 50.0000 mg | ORAL_TABLET | Freq: Two times a day (BID) | ORAL | 1 refills | Status: DC

## 2021-12-04 MED ORDER — LACTULOSE 10 GM/15ML PO SOLN: 10.0000 g | Freq: Three times a day (TID) | ORAL | 1 refills | Status: DC

## 2021-12-04 MED ORDER — OMEPRAZOLE 40 MG PO CPDR: 40.0000 mg | DELAYED_RELEASE_CAPSULE | Freq: Every day | ORAL | 1 refills | Status: DC

## 2021-12-04 MED ORDER — DILTIAZEM HCL ER COATED BEADS 120 MG PO CP24: 120.0000 mg | ORAL_CAPSULE | Freq: Every morning | ORAL | 1 refills | Status: DC

## 2021-12-04 MED ORDER — IBUPROFEN 800 MG PO TABS: 800.0000 mg | ORAL_TABLET | Freq: Three times a day (TID) | ORAL | 1 refills | Status: DC

## 2021-12-04 MED ORDER — DIAZEPAM 10 MG PO TABS
ORAL_TABLET | ORAL | 0 refills | Status: DC
Start: 2021-12-04 — End: 2022-02-02

## 2021-12-04 MED ORDER — ROSUVASTATIN CALCIUM 40 MG PO TABS: 40.0000 mg | ORAL_TABLET | Freq: Every day | ORAL | 1 refills | Status: DC

## 2021-12-04 MED ORDER — CLONIDINE HCL 0.1 MG PO TABS: 0.1000 mg | ORAL_TABLET | Freq: Every day | ORAL | 0 refills | Status: DC

## 2021-12-04 MED ORDER — CARVEDILOL 12.5 MG PO TABS: 12.5000 mg | ORAL_TABLET | Freq: Two times a day (BID) | ORAL | 1 refills | Status: DC

## 2021-12-04 MED ORDER — ZINC SULFATE 220 (50 ZN) MG PO CAPS: 220.0000 mg | ORAL_CAPSULE | Freq: Every day | ORAL | 1 refills | Status: DC

## 2021-12-04 MED ORDER — HYDRALAZINE HCL 25 MG PO TABS: 25.0000 mg | ORAL_TABLET | Freq: Three times a day (TID) | ORAL | 1 refills | Status: DC

## 2021-12-04 MED ORDER — FAMOTIDINE 40 MG PO TABS: 40.0000 mg | ORAL_TABLET | Freq: Two times a day (BID) | ORAL | 1 refills | Status: DC

## 2021-12-04 MED ORDER — DILTIAZEM HCL ER COATED BEADS 240 MG PO CP24: 240.0000 mg | ORAL_CAPSULE | Freq: Every day | ORAL | 1 refills | Status: DC

## 2021-12-04 MED ORDER — AMILORIDE HCL 5 MG PO TABS: 5.0000 mg | ORAL_TABLET | Freq: Every day | ORAL | 1 refills | Status: DC

## 2021-12-04 MED ORDER — TRAMADOL HCL 50 MG PO TABS: ORAL_TABLET | ORAL | 0 refills | Status: DC

## 2021-12-28 ENCOUNTER — Telehealth: Payer: Self-pay | Admitting: Cardiology

## 2021-12-28 NOTE — Telephone Encounter (Signed)
Pt c/o medication issue:  1. Name of Medication: potassium chloride SA (KLOR-CON M) 20 MEQ tablet   2. How are you currently taking this medication (dosage and times per day)?   3. Are you having a reaction (difficulty breathing--STAT)?   4. What is your medication issue? Patients states she can not take this type of medication due to how hard it is on her stomach and would like something else called in for her and for a 90 day supply.

## 2021-12-29 MED ORDER — POTASSIUM CHLORIDE CRYS ER 10 MEQ PO TBCR: 20.0000 meq | EXTENDED_RELEASE_TABLET | Freq: Every day | ORAL | 3 refills | Status: DC

## 2021-12-29 NOTE — Telephone Encounter (Signed)
Pt requested a 10 mEq potassium cl tablet instead of the 20 mEq tablet states that it is not hard on her stomach. RX sent to pharmacy.

## 2022-02-02 ENCOUNTER — Telehealth: Payer: Self-pay | Admitting: Cardiology

## 2022-02-02 ENCOUNTER — Other Ambulatory Visit: Payer: Self-pay | Admitting: Nurse Practitioner

## 2022-02-02 ENCOUNTER — Other Ambulatory Visit: Payer: Self-pay | Admitting: Cardiology

## 2022-02-02 NOTE — Telephone Encounter (Signed)
Pt c/o medication issue:  1. Name of Medication: potassium chloride SA (KLOR-CON M) 20 MEQ tablet    2. How are you currently taking this medication (dosage and times per day)? 2x daily   3. Are you having a reaction (difficulty breathing--STAT)?   4. What is your medication issue? Pt states she hasn't received this medication yet. Previous phone note states to discontinue it but she is requesting another prescription for this medication. She stated that the pharmacy has the long, white tablets but those hurt her stomach and she would like the round orange potassium tablets. Please advise.

## 2022-02-02 NOTE — Telephone Encounter (Signed)
Refill sent to pharmacy.   

## 2022-02-02 NOTE — Telephone Encounter (Signed)
Patient is requesting a refill of the following medications: Requested Prescriptions   Pending Prescriptions Disp Refills   diazepam (VALIUM) 10 MG tablet [Pharmacy Med Name: DIAZEPAM 10 MG TABLET] 30 tablet 0    Sig: TAKE 1 TABLET BY MOUTH EVERY DAY AS NEEDED FOR MUSCLE SPASM    Date of last refill: 12/04/2021  Refill amount: #30  Treatment agreement date: Out dated, should've been updated at appointment in August 2023. No pending appointment scheduled. Mychart message sent to patient to schedule yearly for August 2024.

## 2022-02-02 NOTE — Telephone Encounter (Signed)
Per Dr. Thersa Salt 12-28-21 note. Potassium is normal and she can discontinue the potassium supplement. LVM for pt. Encouraged to call with any questions or concerns.

## 2022-02-03 ENCOUNTER — Other Ambulatory Visit: Payer: Self-pay

## 2022-02-03 MED ORDER — POTASSIUM CHLORIDE ER 10 MEQ PO TBCR: 20.0000 meq | EXTENDED_RELEASE_TABLET | Freq: Every day | ORAL | 3 refills | Status: DC

## 2022-02-03 NOTE — Telephone Encounter (Signed)
I spoke with pt at length regarding her potassium and relayed the message per Dr. Thersa Salt note on 12-28-21 that her potassium was normal and it was ok to discontinue the potassium. The pt is concerned that her potassium has been low before and is not comfortable not taking it. She also wants brand Klor Con because the other one hurt her stomach. The pt requested that I ask Dr. Dulce Sellar again.

## 2022-02-03 NOTE — Telephone Encounter (Signed)
Patient returning call.

## 2022-02-03 NOTE — Telephone Encounter (Signed)
Per Dr. Dulce Sellar- sent in Klor Con 2 tablets daily to pharmacy

## 2022-03-08 ENCOUNTER — Telehealth: Payer: Self-pay | Admitting: Cardiology

## 2022-03-08 ENCOUNTER — Telehealth: Payer: Medicare Other

## 2022-03-08 DIAGNOSIS — E782 Mixed hyperlipidemia: Secondary | ICD-10-CM

## 2022-03-08 NOTE — Telephone Encounter (Signed)
Patient called requesting to schedule a physical for April 2024. I informed patient that her last physical was August 2023 and if we scheduled before physical is due, she will risk the insurance not paying. Patient states she will be out of the country in August and will not return until 2025.  Patients states she will plan on getting a physical in the country that she will be in around August.

## 2022-03-08 NOTE — Telephone Encounter (Signed)
Pt requesting that lab orders be put in before visit on 06/08/22. Pt would like a callback or MYChart message sent if approved. Please advise

## 2022-03-08 NOTE — Telephone Encounter (Signed)
Called patient and she is requesting to have lab orders placed in [facility] prior to her appointment on 06/08/22. Explained to the patient that Dr. Bettina Gavia was out of the office and would be back next Monday. She is fine with waiting until next Monday to check with Dr. Bettina Gavia and then putting the orders in [facility]. Will check with Dr. Bettina Gavia when he returns.

## 2022-05-21 ENCOUNTER — Other Ambulatory Visit: Payer: Self-pay | Admitting: Nurse Practitioner

## 2022-05-21 NOTE — Telephone Encounter (Signed)
High risk or very high risk warning populated when attempting to refill medication. RX request sent to PCP for review and approval if warranted.   

## 2022-05-27 DIAGNOSIS — E782 Mixed hyperlipidemia: Secondary | ICD-10-CM | POA: Diagnosis not present

## 2022-05-27 LAB — COMPREHENSIVE METABOLIC PANEL
ALT: 20 IU/L (ref 0–32)
AST: 17 IU/L (ref 0–40)
Albumin/Globulin Ratio: 1.8 (ref 1.2–2.2)
Albumin: 4.2 g/dL (ref 3.9–4.9)
Alkaline Phosphatase: 101 IU/L (ref 44–121)
BUN/Creatinine Ratio: 31 — ABNORMAL HIGH (ref 12–28)
BUN: 29 mg/dL — ABNORMAL HIGH (ref 8–27)
Bilirubin Total: 0.3 mg/dL (ref 0.0–1.2)
CO2: 19 mmol/L — ABNORMAL LOW (ref 20–29)
Calcium: 9 mg/dL (ref 8.7–10.3)
Chloride: 110 mmol/L — ABNORMAL HIGH (ref 96–106)
Creatinine, Ser: 0.95 mg/dL (ref 0.57–1.00)
Globulin, Total: 2.3 g/dL (ref 1.5–4.5)
Glucose: 90 mg/dL (ref 70–99)
Potassium: 4 mmol/L (ref 3.5–5.2)
Sodium: 144 mmol/L (ref 134–144)
Total Protein: 6.5 g/dL (ref 6.0–8.5)
eGFR: 66 mL/min/{1.73_m2} (ref >59)

## 2022-05-27 LAB — LIPID PANEL
Chol/HDL Ratio: 2 ratio (ref 0.0–4.4)
Cholesterol, Total: 166 mg/dL (ref 100–199)
HDL: 82 mg/dL (ref >39)
LDL Chol Calc (NIH): 68 mg/dL (ref 0–99)
Triglycerides: 85 mg/dL (ref 0–149)
VLDL Cholesterol Cal: 16 mg/dL (ref 5–40)

## 2022-05-31 ENCOUNTER — Other Ambulatory Visit: Payer: Self-pay | Admitting: Family

## 2022-05-31 DIAGNOSIS — I10 Essential (primary) hypertension: Secondary | ICD-10-CM

## 2022-05-31 NOTE — Progress Notes (Signed)
Attempted to call the patient. Patient did not answer the phone and the call could not be completed at this time.

## 2022-06-01 ENCOUNTER — Other Ambulatory Visit: Payer: Self-pay | Admitting: Family

## 2022-06-01 NOTE — Telephone Encounter (Signed)
Patient is requesting a refill of the following medications: Requested Prescriptions   Pending Prescriptions Disp Refills   diazepam (VALIUM) 10 MG tablet [Pharmacy Med Name: DIAZEPAM 10 MG TABLET] 30 tablet 0    Sig: TAKE 1 TABLET BY MOUTH EVERY DAY AS NEEDED FOR MUSCLE SPASM    Date of last refill:02/02/22  Refill amount: 30/0  Treatment agreement date: Not on file, appointment is due, notification was sent  via MyChart

## 2022-06-03 ENCOUNTER — Other Ambulatory Visit: Payer: Self-pay | Admitting: Nurse Practitioner

## 2022-06-03 ENCOUNTER — Other Ambulatory Visit: Payer: Self-pay | Admitting: Family

## 2022-06-03 DIAGNOSIS — K219 Gastro-esophageal reflux disease without esophagitis: Secondary | ICD-10-CM

## 2022-06-03 DIAGNOSIS — E782 Mixed hyperlipidemia: Secondary | ICD-10-CM

## 2022-06-04 ENCOUNTER — Other Ambulatory Visit: Payer: Self-pay

## 2022-06-04 DIAGNOSIS — I10 Essential (primary) hypertension: Secondary | ICD-10-CM

## 2022-06-04 MED ORDER — CLONIDINE HCL 0.1 MG PO TABS: 0.1000 mg | ORAL_TABLET | Freq: Every day | ORAL | 1 refills | Status: DC

## 2022-06-04 NOTE — Telephone Encounter (Signed)
Refill request received for medication High allergy warning  Medication pended and sent to Richarda Blade, NP

## 2022-06-04 NOTE — Telephone Encounter (Signed)
error 

## 2022-06-07 NOTE — Progress Notes (Unsigned)
Cardiology Office Note:    Date:  06/08/2022   ID:  Brooke Wilson, DOB [birth year], MRN [id number]  PCP:  Caesar Bookman, NP  Cardiologist:  Norman Herrlich, MD    Referring MD: Caesar Bookman, NP    ASSESSMENT:    1. Resistant hypertension   2. Mixed hyperlipidemia    PLAN:    In order of problems listed above:  She continues to do well with her previously resistant hypertension now well-controlled with multiple agents including nocturnal clonidine carvedilol diltiazem hydralazine at this time not on diuretic.  Continue current antihypertensives Lipids are ideal continue his statin  Next appointment: 6 months   Medication Adjustments/Labs and Tests Ordered: Current medicines are reviewed at length with the patient today.  Concerns regarding medicines are outlined above.  No orders of the defined types were placed in this encounter.  No orders of the defined types were placed in this encounter.    History of Present Illness:    Brooke Wilson is a 67 y.o. female with a hx of resistant hypertension hyperlipidemia and prediabetes last seen 11/23/2021.  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 Continues to do well blood pressure is controlled and has not had any cardiovascular symptoms of edema shortness of breath chest pain palpitation syncope Recent labs 05/27/2022 cholesterol 166 LDL 68 HDL 82 triglycerides 85 A1c 5.6 hemoglobin 12.5 creatinine 0.95 potassium 4.0 EKG today shows sinus rhythm left axis deviation ST-T abnormality probably described as nonspecific Compliance with diet, lifestyle and medications: Yes Past Medical History:  Diagnosis Date   Acute maxillary sinusitis 11/12/2015   Allergy    Anxiety state 05/17/2011   AR (allergic rhinitis) 02/17/2012   Asthma    BMI 31.0-31.9,adult 03/23/2011   Cough 03/23/2011   Dental disease 02/17/2012   [place] 01[place]    Essential hypertension, benign 03/23/2011   GERD  (gastroesophageal reflux disease) 05/17/2011   H/O varicella    History of measles, mumps, or rubella    Hyperlipemia 03/23/2011   Hyperlipidemia    Hypertension    Hypertensive disorder 09/30/2016   Insomnia 05/17/2011   Mid back pain 02/17/2012   Neck pain 02/17/2012   Obesity 09/30/2016   Pelvic floor relaxation 09/30/2016   RAD (reactive airway disease) 02/17/2012    Past Surgical History:  Procedure Laterality Date   REPLACEMENT TOTAL KNEE Right    TOTAL KNEE ARTHROPLASTY Left 01/07/2020   Procedure: TOTAL KNEE ARTHROPLASTY;  Surgeon: Ollen Gross, MD;  Location: [facility];  Service: Orthopedics;  Laterality: Left;    TUBAL LIGATION     VAGINAL HYSTERECTOMY     uterine prolapse; DUB; ovaries intact.  [place]    Current Medications: Current Meds  Medication Sig   albuterol (PROVENTIL HFA) 108 (90 Base) MCG/ACT inhaler INHALE 2 PUFFS INTO THE LUNGS EVERY 6 HOURS AS NEEDED FOR WHEEZING OR SHORTNESS OF BREATH   aMILoride (MIDAMOR) 5 MG tablet TAKE 1 TABLET (5 MG TOTAL) BY MOUTH DAILY.   amoxicillin-clavulanate (AUGMENTIN) 875-125 MG tablet Take 1 tablet by mouth 2 (two) times daily.   baclofen (LIORESAL) 10 MG tablet TAKE 1 TABLET BY MOUTH 3 TIMES A DAY AS NEEDED FOR MUSCLE RELAXANT FOR NECK (Patient taking differently: Take 10 mg by mouth as needed for muscle spasms. TAKE 1 TABLET BY MOUTH 3 TIMES A DAY AS NEEDED FOR MUSCLE RELAXANT FOR NECK)   carvedilol (COREG) 12.5 MG tablet TAKE 1 TABLET BY MOUTH  2 TIMES DAILY.   cloNIDine (CATAPRES) 0.1 MG tablet Take 1 tablet (0.1 mg total) by mouth at bedtime.   diazepam (VALIUM) 10 MG tablet TAKE 1 TABLET BY MOUTH EVERY DAY AS NEEDED FOR MUSCLE SPASM   diltiazem (CARDIZEM CD) 120 MG 24 hr capsule Take 1 capsule (120 mg total) by mouth every morning.   diltiazem (CARDIZEM CD) 240 MG 24 hr capsule Take 1 capsule (240 mg total) by mouth at bedtime.   famotidine (PEPCID) 40 MG tablet Take 1 tablet (40 mg total) by mouth 2 (two) times daily.    fluticasone (FLONASE) 50 MCG/ACT nasal spray PLACE 2 SPRAYS IN THE NOSTRIL EVERY DAY (Patient taking differently: Place 2 sprays into both nostrils daily. PLACE 2 SPRAYS IN THE NOSTRIL EVERY DAY)   hydrALAZINE (APRESOLINE) 25 MG tablet Take 1 tablet (25 mg total) by mouth 3 (three) times daily.   hydrOXYzine (ATARAX) 50 MG tablet Take 1 tablet (50 mg total) by mouth 2 (two) times daily.   ibuprofen (ADVIL) 800 MG tablet Take 1 tablet (800 mg total) by mouth in the morning, at noon, and at bedtime.   lactulose (CHRONULAC) 10 GM/15ML solution Take 15 mLs (10 g total) by mouth 3 (three) times daily.   montelukast (SINGULAIR) 10 MG tablet Take 1 tablet (10 mg total) by mouth at bedtime.   omeprazole (PRILOSEC) 40 MG capsule TAKE 1 CAPSULE (40 MG TOTAL) BY MOUTH DAILY.   potassium chloride (KLOR-CON) 10 MEQ tablet Take 2 tablets (20 mEq total) by mouth daily.   rosuvastatin (CRESTOR) 40 MG tablet TAKE 1 TABLET BY MOUTH EVERY DAY   traMADol (ULTRAM) 50 MG tablet TAKE 1 TABLET BY MOUTH EVERY 12 HOURS AS NEEDED FOR MODERATE PAIN   zinc sulfate 220 (50 Zn) MG capsule Take 1 capsule (220 mg total) by mouth daily.     Allergies:   Ace inhibitors, Iodinated contrast media, Lisinopril, Sulfa antibiotics, Iodine, Red dye, Tape, Yellow dye, Bactrim, Flexeril [cyclobenzaprine hcl], Latex, Naprosyn [naproxen], and Sulfamethoxazole-trimethoprim   Social History   Socioeconomic History   Marital status: Married    Spouse name: Not on file   Number of children: Not on file   Years of education: Not on file   Highest education level: Not on file  Occupational History   Not on file  Tobacco Use   Smoking status: Never    Passive exposure: Never   Smokeless tobacco: Never  Vaping Use   Vaping Use: Never used  Substance and Sexual Activity   Alcohol use: No    Alcohol/week: 0.0 standard drinks of alcohol   Drug use: No   Sexual activity: Yes    Birth control/protection: Post-menopausal, Surgical     Comment: Hysterectomy  Other Topics Concern   Not on file  Social History Narrative   Marital status:  Married x 38 years; from the [place]; moved to [place] in 1987.      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      Advanced Directives: DNR/DNI; HCPOA: Sati Eberlein-Enicole         Tobacco use, amount per day now: None.   Past tobacco use, amount  per day: None.   How many years did you use tobacco: N/A   Alcohol use (drinks per week): N/A   Diet: Regular.   Do you drink/eat things with caffeine: No.   Marital status:  Married                                What year were you married? 1981   Do you live in a house, apartment, assisted living, condo, trailer, etc.? House.   Is it one or more stories?    How many persons live in your home? 2   Do you have pets in your home?( please list) None.   Highest Level of education completed? Bachelors Degree in Nursing.   Current or past profession: RN   Do you exercise?     Yes                             Type and how often? 4 times a week at the gym.   Do you have a living will? No.   Do you have a DNR form? No.                                  If not, do you want to discuss one?   Do you have signed POA/HPOA forms?  No.                      If so, please bring to you appointment      Do you have any difficulty bathing or dressing yourself? No.   Do you have any difficulty preparing food or eating? No.   Do you have any difficulty managing your medications? No.   Do you have any difficulty managing your finances? No.   Do you have any difficulty affording your medications? No.   Social Determinants of Health   Financial Resource Strain: Not on file  Food Insecurity: Not on file  Transportation Needs: Not on file  Physical  Activity: Not on file  Stress: Not on file  Social Connections: Not on file     Family History: The patient's family history includes Cancer in her maternal grandmother; Hypertension in her brother, mother, and sister; Stroke in her brother and mother; Stroke (age of onset: 69) in her sister. ROS:   Please see the history of present illness.    All other systems reviewed and are negative.  EKGs/Labs/Other Studies Reviewed:    The following studies were reviewed today:  Cardiac Studies & Procedures     STRESS TESTS  MYOCARDIAL PERFUSION IMAGING 10/07/2016  Narrative  Nuclear stress EF: 64%.  There was no ST segment deviation noted during stress.  The study is normal.  The left ventricular ejection fraction is normal (55-65%).  1. EF 64% with normal wall motion. 2. No evidence for ischemia or infarction by perfusion images.  Normal study.               Recent Labs: 09/23/2021: Hemoglobin 12.5; Platelets 247; TSH 1.72 05/27/2022: ALT 20; BUN 29; Creatinine, Ser 0.95; Potassium 4.0; Sodium 144  Recent Lipid Panel    Component Value Date/Time   CHOL 166 05/27/2022 0729   TRIG 85 05/27/2022 0729   HDL 82 05/27/2022 0729   CHOLHDL 2.0 05/27/2022 0729  CHOLHDL 2.5 09/23/2021 1018   VLDL 35 (H) 02/17/2015 1956   LDLCALC 68 05/27/2022 0729   LDLCALC 78 09/23/2021 1018    Physical Exam:    VS:  BP 122/64 (BP Location: Right Arm, Patient Position: Sitting)   Pulse 73   Ht  (1.499 m)   Wt 162 lb 12.8 oz (73.8 kg)   SpO2 98%   BMI 32.88 kg/m     Wt Readings from Last 3 Encounters:  06/08/22 162 lb 12.8 oz (73.8 kg)  11/23/21 159 lb 12.8 oz (72.5 kg)  09/23/21 156 lb 6.4 oz (70.9 kg)     GEN:  Well nourished, well developed in no acute distress HEENT: Normal NECK: No JVD; No carotid bruits LYMPHATICS: No lymphadenopathy CARDIAC: RRR, no murmurs, rubs, gallops RESPIRATORY:  Clear to auscultation without rales, wheezing or rhonchi  ABDOMEN: Soft,  non-tender, non-distended MUSCULOSKELETAL:  No edema; No deformity  SKIN: Warm and dry NEUROLOGIC:  Alert and oriented x 3 PSYCHIATRIC:  Normal affect    Signed, Norman Herrlich, MD  06/08/2022 11:25 AM    [facility]

## 2022-06-08 ENCOUNTER — Ambulatory Visit: Payer: Medicare Other | Attending: Cardiology | Admitting: Cardiology

## 2022-06-08 ENCOUNTER — Encounter: Payer: Self-pay | Admitting: Cardiology

## 2022-06-08 VITALS — BP 122/64 | HR 73 | Ht 59.0 in | Wt 162.8 lb

## 2022-06-08 DIAGNOSIS — I1A0 Resistant hypertension: Secondary | ICD-10-CM

## 2022-06-08 DIAGNOSIS — E782 Mixed hyperlipidemia: Secondary | ICD-10-CM | POA: Diagnosis not present

## 2022-06-08 DIAGNOSIS — I10 Essential (primary) hypertension: Secondary | ICD-10-CM

## 2022-06-08 MED ORDER — HYDRALAZINE HCL 25 MG PO TABS
25.0000 mg | ORAL_TABLET | Freq: Three times a day (TID) | ORAL | 3 refills | Status: DC
Start: 2022-06-08 — End: 2022-12-16

## 2022-06-08 MED ORDER — ROSUVASTATIN CALCIUM 40 MG PO TABS
40.0000 mg | ORAL_TABLET | Freq: Every day | ORAL | 3 refills | Status: AC
Start: 2022-06-08 — End: ?

## 2022-06-08 MED ORDER — CARVEDILOL 12.5 MG PO TABS
12.5000 mg | ORAL_TABLET | Freq: Two times a day (BID) | ORAL | 3 refills | Status: DC
Start: 2022-06-08 — End: 2022-12-16

## 2022-06-08 MED ORDER — CLONIDINE HCL 0.1 MG PO TABS
0.1000 mg | ORAL_TABLET | Freq: Every day | ORAL | 3 refills | Status: AC
Start: 2022-06-08 — End: ?

## 2022-06-08 MED ORDER — POTASSIUM CHLORIDE ER 10 MEQ PO TBCR: 20.0000 meq | EXTENDED_RELEASE_TABLET | Freq: Every day | ORAL | 3 refills | Status: DC

## 2022-06-08 MED ORDER — DILTIAZEM HCL ER COATED BEADS 120 MG PO CP24
120.0000 mg | ORAL_CAPSULE | Freq: Every morning | ORAL | 3 refills | Status: DC
Start: 2022-06-08 — End: 2022-12-16

## 2022-06-08 MED ORDER — DILTIAZEM HCL ER COATED BEADS 240 MG PO CP24
240.0000 mg | ORAL_CAPSULE | Freq: Every day | ORAL | 1 refills | Status: DC
Start: 2022-06-08 — End: 2022-12-16

## 2022-06-08 NOTE — Patient Instructions (Signed)
Medication Instructions:  Your physician recommends that you continue on your current medications as directed. Please refer to the Current Medication list given to you today.  *If you need a refill on your cardiac medications before your next appointment, please call your pharmacy*   Lab Work: None If you have labs (blood work) drawn today and your tests are completely normal, you will receive your results only by: MyChart Message (if you have MyChart) OR A paper copy in the mail If you have any lab test that is abnormal or we need to change your treatment, we will call you to review the results.   Testing/Procedures: None   Follow-Up: At [facility], you and your health needs are our priority.  As part of our continuing mission to provide you with exceptional heart care, we have created designated Provider Care Teams.  These Care Teams include your primary Cardiologist (physician) and Advanced Practice Providers (APPs -  Physician Assistants and Nurse Practitioners) who all work together to provide you with the care you need, when you need it.  We recommend signing up for the patient portal called "MyChart".  Sign up information is provided on this After Visit Summary.  MyChart is used to connect with patients for Virtual Visits (Telemedicine).  Patients are able to view lab/test results, encounter notes, upcoming appointments, etc.  Non-urgent messages can be sent to your provider as well.   To learn more about what you can do with MyChart, go to [email or web address].    Your next appointment:   6 month(s)  Provider:   Brian Munley, MD    Other Instructions None  

## 2022-06-24 ENCOUNTER — Other Ambulatory Visit: Payer: Self-pay | Admitting: Family

## 2022-06-24 DIAGNOSIS — G8929 Other chronic pain: Secondary | ICD-10-CM

## 2022-08-26 ENCOUNTER — Other Ambulatory Visit: Payer: Self-pay | Admitting: Family

## 2022-08-26 ENCOUNTER — Other Ambulatory Visit: Payer: Self-pay | Admitting: Nurse Practitioner

## 2022-08-26 DIAGNOSIS — E782 Mixed hyperlipidemia: Secondary | ICD-10-CM

## 2022-08-26 DIAGNOSIS — J4520 Mild intermittent asthma, uncomplicated: Secondary | ICD-10-CM

## 2022-08-26 NOTE — Telephone Encounter (Signed)
Patient medication Crestor has High Risk Warnings. Medication pend and sent to PCP Ngetich, Donalee Citrin, NP

## 2022-08-26 NOTE — Telephone Encounter (Signed)
Patient medication Montelukast has High Risk Warnings. Medication pend and sent to PCP Ngetich, Donalee Citrin, NP

## 2022-08-28 ENCOUNTER — Other Ambulatory Visit: Payer: Self-pay | Admitting: Nurse Practitioner

## 2022-08-30 NOTE — Telephone Encounter (Signed)
Patient medication has High Risk Warnings.  

## 2022-10-13 ENCOUNTER — Other Ambulatory Visit: Payer: Self-pay | Admitting: Family

## 2022-10-13 ENCOUNTER — Other Ambulatory Visit: Payer: Self-pay | Admitting: Cardiology

## 2022-10-14 NOTE — Telephone Encounter (Signed)
Patient has request refill on medication Amiloride 5mg . Patient medication has Allergy Contraindications. Medication pend and sent to PCP Ngetich, Donalee Citrin, NP

## 2022-11-08 ENCOUNTER — Telehealth: Payer: Self-pay | Admitting: *Deleted

## 2022-11-08 DIAGNOSIS — K219 Gastro-esophageal reflux disease without esophagitis: Secondary | ICD-10-CM

## 2022-11-08 DIAGNOSIS — N182 Chronic kidney disease, stage 2 (mild): Secondary | ICD-10-CM

## 2022-11-08 DIAGNOSIS — E782 Mixed hyperlipidemia: Secondary | ICD-10-CM

## 2022-11-08 DIAGNOSIS — R7303 Prediabetes: Secondary | ICD-10-CM

## 2022-11-08 DIAGNOSIS — I10 Essential (primary) hypertension: Secondary | ICD-10-CM

## 2022-11-08 NOTE — Telephone Encounter (Signed)
Patient called and stated that she has an appointment scheduled for Thursday but is requesting orders to take to [facility] to have labs drawn before her appointment.   No orders are in Epic. Patient is requesting Labs to be done off site at [facility].   Please Advise.

## 2022-11-08 NOTE — Telephone Encounter (Signed)
Lab ordered.Patient to pick up lab orders to take to [facility]

## 2022-11-08 NOTE — Telephone Encounter (Signed)
Patient notified and orders left up front for pick up

## 2022-11-11 ENCOUNTER — Ambulatory Visit: Payer: Medicare Other | Admitting: Family

## 2022-11-23 ENCOUNTER — Other Ambulatory Visit: Payer: Medicare Other

## 2022-11-23 DIAGNOSIS — R7303 Prediabetes: Secondary | ICD-10-CM

## 2022-11-23 DIAGNOSIS — N182 Chronic kidney disease, stage 2 (mild): Secondary | ICD-10-CM

## 2022-11-23 DIAGNOSIS — E782 Mixed hyperlipidemia: Secondary | ICD-10-CM | POA: Diagnosis not present

## 2022-11-23 DIAGNOSIS — I10 Essential (primary) hypertension: Secondary | ICD-10-CM

## 2022-11-23 DIAGNOSIS — K219 Gastro-esophageal reflux disease without esophagitis: Secondary | ICD-10-CM

## 2022-11-24 LAB — CBC WITH DIFFERENTIAL/PLATELET
Absolute Monocytes: 391 {cells}/uL (ref 200–950)
Basophils Absolute: 83 {cells}/uL (ref 0–200)
Basophils Relative: 1.5 %
Eosinophils Absolute: 363 {cells}/uL (ref 15–500)
Eosinophils Relative: 6.6 %
HCT: 41.9 % (ref 35.0–45.0)
Hemoglobin: 12.8 g/dL (ref 11.7–15.5)
Lymphs Abs: 1634 {cells}/uL (ref 850–3900)
MCH: 26 pg — ABNORMAL LOW (ref 27.0–33.0)
MCHC: 30.5 g/dL — ABNORMAL LOW (ref 32.0–36.0)
MCV: 85 fL (ref 80.0–100.0)
MPV: 10.8 fL (ref 7.5–12.5)
Monocytes Relative: 7.1 %
Neutro Abs: 3031 {cells}/uL (ref 1500–7800)
Neutrophils Relative %: 55.1 %
Platelets: 228 10*3/uL (ref 140–400)
RBC: 4.93 10*6/uL (ref 3.80–5.10)
RDW: 13.9 % (ref 11.0–15.0)
Total Lymphocyte: 29.7 %
WBC: 5.5 10*3/uL (ref 3.8–10.8)

## 2022-11-24 LAB — COMPLETE METABOLIC PANEL WITH GFR
AG Ratio: 1.5 (calc) (ref 1.0–2.5)
ALT: 12 U/L (ref 6–29)
AST: 18 U/L (ref 10–35)
Albumin: 4.2 g/dL (ref 3.6–5.1)
Alkaline phosphatase (APISO): 95 U/L (ref 37–153)
BUN: 17 mg/dL (ref 7–25)
CO2: 23 mmol/L (ref 20–32)
Calcium: 9.6 mg/dL (ref 8.6–10.4)
Chloride: 112 mmol/L — ABNORMAL HIGH (ref 98–110)
Creat: 0.88 mg/dL (ref 0.50–1.05)
Globulin: 2.8 g/dL (ref 1.9–3.7)
Glucose, Bld: 101 mg/dL — ABNORMAL HIGH (ref 65–99)
Potassium: 4.8 mmol/L (ref 3.5–5.3)
Sodium: 142 mmol/L (ref 135–146)
Total Bilirubin: 0.3 mg/dL (ref 0.2–1.2)
Total Protein: 7 g/dL (ref 6.1–8.1)
eGFR: 72 mL/min/{1.73_m2} (ref >=60)

## 2022-11-24 LAB — LIPID PANEL
Cholesterol: 154 mg/dL (ref <200)
HDL: 63 mg/dL (ref >=50)
LDL Cholesterol (Calc): 75 mg/dL
Non-HDL Cholesterol (Calc): 91 mg/dL (ref <130)
Total CHOL/HDL Ratio: 2.4 (calc) (ref <5.0)
Triglycerides: 80 mg/dL (ref <150)

## 2022-11-24 LAB — TSH: TSH: 0.84 m[IU]/L (ref 0.40–4.50)

## 2022-11-24 LAB — HEMOGLOBIN A1C
Hgb A1c MFr Bld: 5.9 %{Hb} — ABNORMAL HIGH (ref <5.7)
Mean Plasma Glucose: 123 mg/dL
eAG (mmol/L): 6.8 mmol/L

## 2022-12-15 NOTE — Progress Notes (Unsigned)
Cardiology Office Note:    Date:  12/16/2022   ID:  Brooke Wilson, DOB December 25, 1955, MRN [id number]  PCP:  Caesar Bookman, NP  Cardiologist:  Norman Herrlich, MD    Referring MD: Caesar Bookman, NP    ASSESSMENT:    1. Resistant hypertension   2. Mixed hyperlipidemia   3. Prediabetes    PLAN:    In order of problems listed above:  She has done remarkably well with hypertension off clonidine will only be taken if needed Home blood pressures run 130 and 140 systolic continue multiple drug regimen including amiloride carvedilol diltiazem and hydralazine. LDL target continue her statin   Next appointment: 6 months   Medication Adjustments/Labs and Tests Ordered: Current medicines are reviewed at length with the patient today.  Concerns regarding medicines are outlined above.  Orders Placed This Encounter  Procedures   EKG 12-Lead   No orders of the defined types were placed in this encounter.    History of Present Illness:    Brooke Wilson is a 67 y.o. female with a hx of resistant hypertension hyperlipidemia and prediabetes last seen 06/08/2022. Compliance with diet, lifestyle and medications: Yes  She has returned to work full-time Has no cardiovascular symptoms of edema shortness of breath orthopnea chest pain palpitation or syncope Remarkable improvement since starting distal diuretic No longer has edema with her calcium channel blocker and has not needed to take as needed clonidine She tolerates her statin without muscle pain or weakness Past Medical History:  Diagnosis Date   Acute maxillary sinusitis 11/12/2015   Allergy    Anxiety state 05/17/2011   AR (allergic rhinitis) 02/17/2012   Asthma    BMI 31.0-31.9,adult 03/23/2011   Cough 03/23/2011   Dental disease 02/17/2012   [place] 01/2012    Essential hypertension, benign 03/23/2011   GERD (gastroesophageal reflux disease) 05/17/2011   H/O varicella    History of measles, mumps,  or rubella    Hyperlipemia 03/23/2011   Hyperlipidemia    Hypertension    Hypertensive disorder 09/30/2016   Insomnia 05/17/2011   Mid back pain 02/17/2012   Neck pain 02/17/2012   Obesity 09/30/2016   Pelvic floor relaxation 09/30/2016   RAD (reactive airway disease) 02/17/2012    Current Medications: Current Meds  Medication Sig   albuterol (PROVENTIL HFA) 108 (90 Base) MCG/ACT inhaler INHALE 2 PUFFS INTO THE LUNGS EVERY 6 HOURS AS NEEDED FOR WHEEZING OR SHORTNESS OF BREATH   aMILoride (MIDAMOR) 5 MG tablet Take 1 tablet (5 mg total) by mouth daily.   amoxicillin-clavulanate (AUGMENTIN) 875-125 MG tablet Take 1 tablet by mouth 2 (two) times daily.   baclofen (LIORESAL) 10 MG tablet TAKE 1 TABLET BY MOUTH 3 TIMES A DAY AS NEEDED FOR MUSCLE RELAXANT FOR NECK   carvedilol (COREG) 12.5 MG tablet Take 1 tablet (12.5 mg total) by mouth 2 (two) times daily.   cloNIDine (CATAPRES) 0.1 MG tablet Take 1 tablet (0.1 mg total) by mouth at bedtime.   diazepam (VALIUM) 10 MG tablet TAKE 1 TABLET BY MOUTH EVERY DAY AS NEEDED FOR MUSCLE SPASM   diltiazem (CARDIZEM CD) 120 MG 24 hr capsule Take 1 capsule (120 mg total) by mouth every morning.   diltiazem (CARDIZEM CD) 240 MG 24 hr capsule Take 1 capsule (240 mg total) by mouth at bedtime.   famotidine (PEPCID) 40 MG tablet Take 1 tablet (40 mg total) by mouth 2 (two) times daily.   fluticasone (FLONASE) 50  MCG/ACT nasal spray Place 2 sprays into both nostrils daily. PLACE 2 SPRAYS IN THE NOSTRIL EVERY DAY   hydrALAZINE (APRESOLINE) 25 MG tablet Take 1 tablet (25 mg total) by mouth 3 (three) times daily.   hydrOXYzine (ATARAX) 50 MG tablet Take 1 tablet (50 mg total) by mouth 2 (two) times daily.   ibuprofen (ADVIL) 800 MG tablet Take 1 tablet (800 mg total) by mouth in the morning, at noon, and at bedtime.   lactulose (CHRONULAC) 10 GM/15ML solution Take 15 mLs (10 g total) by mouth 3 (three) times daily.   montelukast (SINGULAIR) 10 MG tablet Take 1 tablet (10  mg total) by mouth at bedtime.   omeprazole (PRILOSEC) 40 MG capsule Take 1 capsule (40 mg total) by mouth daily.   potassium chloride (KLOR-CON) 10 MEQ tablet Take 2 tablets (20 mEq total) by mouth daily.   rosuvastatin (CRESTOR) 40 MG tablet Take 1 tablet (40 mg total) by mouth daily.   traMADol (ULTRAM) 50 MG tablet TAKE 1 TABLET BY MOUTH EVERY 12 HOURS AS NEEDED FOR MODERATE PAIN   zinc sulfate 220 (50 Zn) MG capsule Take 1 capsule (220 mg total) by mouth daily.      EKGs/Labs/Other Studies Reviewed:    The following studies were reviewed today:     EKG Interpretation Date/Time:  Thursday December 16 2022 14:19:34 EDT Ventricular Rate:  69 PR Interval:  256 QRS Duration:  90 QT Interval:  380 QTC Calculation: 407 R Axis:   -32  Text Interpretation: Sinus rhythm with 1st degree A-V block Left axis deviation Nonspecific T wave abnormality No previous ECGs available Confirmed by Norman Herrlich ([id number]) on 12/16/2022 2:24:33 PM    Recent Labs: 11/23/2022: ALT 12; BUN 17; Creat 0.88; Hemoglobin 12.8; Platelets 228; Potassium 4.8; Sodium 142; TSH 0.84  Recent Lipid Panel    Component Value Date/Time   CHOL 154 11/23/2022 1056   CHOL 166 05/27/2022 0729   TRIG 80 11/23/2022 1056   HDL 63 11/23/2022 1056   HDL 82 05/27/2022 0729   CHOLHDL 2.4 11/23/2022 1056   VLDL 35 (H) 02/17/2015 1956   LDLCALC 75 11/23/2022 1056    Physical Exam:    VS:  BP 120/60 (BP Location: Left Arm, Patient Position: Sitting, Cuff Size: Normal)   Pulse 69   Ht 4\' 11"  (1.499 m)   Wt 164 lb (74.4 kg)   SpO2 98%   BMI 33.12 kg/m     Wt Readings from Last 3 Encounters:  12/16/22 164 lb (74.4 kg)  12/16/22 164 lb 8 oz (74.6 kg)  06/08/22 162 lb 12.8 oz (73.8 kg)     GEN:  Well nourished, well developed in no acute distress HEENT: Normal NECK: No JVD; No carotid bruits LYMPHATICS: No lymphadenopathy CARDIAC: RRR, no murmurs, rubs, gallops RESPIRATORY:  Clear to auscultation without rales,  wheezing or rhonchi  ABDOMEN: Soft, non-tender, non-distended MUSCULOSKELETAL:  No edema; No deformity  SKIN: Warm and dry NEUROLOGIC:  Alert and oriented x 3 PSYCHIATRIC:  Normal affect    Signed, Norman Herrlich, MD  12/16/2022 3:00 PM    [facility]

## 2022-12-16 ENCOUNTER — Encounter: Payer: Self-pay | Admitting: Cardiology

## 2022-12-16 ENCOUNTER — Ambulatory Visit (INDEPENDENT_AMBULATORY_CARE_PROVIDER_SITE_OTHER): Payer: Medicare Other | Admitting: Family

## 2022-12-16 ENCOUNTER — Encounter: Payer: Self-pay | Admitting: Family

## 2022-12-16 ENCOUNTER — Ambulatory Visit: Payer: Medicare Other | Attending: Cardiology | Admitting: Cardiology

## 2022-12-16 VITALS — BP 128/66 | HR 78 | Temp 97.6°F | Resp 20 | Ht 59.0 in | Wt 164.5 lb

## 2022-12-16 VITALS — BP 120/60 | HR 69 | Ht 59.0 in | Wt 164.0 lb

## 2022-12-16 DIAGNOSIS — Z1231 Encounter for screening mammogram for malignant neoplasm of breast: Secondary | ICD-10-CM | POA: Diagnosis not present

## 2022-12-16 DIAGNOSIS — M542 Cervicalgia: Secondary | ICD-10-CM

## 2022-12-16 DIAGNOSIS — J3089 Other allergic rhinitis: Secondary | ICD-10-CM

## 2022-12-16 DIAGNOSIS — I10 Essential (primary) hypertension: Secondary | ICD-10-CM | POA: Diagnosis not present

## 2022-12-16 DIAGNOSIS — J4520 Mild intermittent asthma, uncomplicated: Secondary | ICD-10-CM

## 2022-12-16 DIAGNOSIS — E782 Mixed hyperlipidemia: Secondary | ICD-10-CM

## 2022-12-16 DIAGNOSIS — I1A0 Resistant hypertension: Secondary | ICD-10-CM | POA: Diagnosis not present

## 2022-12-16 DIAGNOSIS — R7303 Prediabetes: Secondary | ICD-10-CM

## 2022-12-16 DIAGNOSIS — K219 Gastro-esophageal reflux disease without esophagitis: Secondary | ICD-10-CM

## 2022-12-16 DIAGNOSIS — G8929 Other chronic pain: Secondary | ICD-10-CM | POA: Diagnosis not present

## 2022-12-16 DIAGNOSIS — Z780 Asymptomatic menopausal state: Secondary | ICD-10-CM

## 2022-12-16 MED ORDER — IBUPROFEN 800 MG PO TABS: 800.0000 mg | ORAL_TABLET | Freq: Three times a day (TID) | ORAL | 1 refills | Status: DC

## 2022-12-16 MED ORDER — AMILORIDE HCL 5 MG PO TABS: 5.0000 mg | ORAL_TABLET | Freq: Every day | ORAL | 1 refills | Status: DC

## 2022-12-16 MED ORDER — ROSUVASTATIN CALCIUM 40 MG PO TABS: 40.0000 mg | ORAL_TABLET | Freq: Every day | ORAL | 3 refills | Status: DC

## 2022-12-16 MED ORDER — MONTELUKAST SODIUM 10 MG PO TABS: 10.0000 mg | ORAL_TABLET | Freq: Every day | ORAL | 1 refills | Status: DC

## 2022-12-16 MED ORDER — LACTULOSE 10 GM/15ML PO SOLN: 10.0000 g | Freq: Three times a day (TID) | ORAL | 1 refills | Status: DC

## 2022-12-16 MED ORDER — FLUTICASONE PROPIONATE 50 MCG/ACT NA SUSP: 2.0000 | Freq: Every day | NASAL | 3 refills | Status: DC

## 2022-12-16 MED ORDER — HYDROXYZINE HCL 50 MG PO TABS: 50.0000 mg | ORAL_TABLET | Freq: Two times a day (BID) | ORAL | 1 refills | Status: DC

## 2022-12-16 MED ORDER — DIAZEPAM 10 MG PO TABS: ORAL_TABLET | ORAL | 0 refills | Status: DC

## 2022-12-16 MED ORDER — HYDRALAZINE HCL 25 MG PO TABS: 25.0000 mg | ORAL_TABLET | Freq: Three times a day (TID) | ORAL | 3 refills | Status: DC

## 2022-12-16 MED ORDER — CARVEDILOL 12.5 MG PO TABS: 12.5000 mg | ORAL_TABLET | Freq: Two times a day (BID) | ORAL | 3 refills | Status: DC

## 2022-12-16 MED ORDER — BACLOFEN 10 MG PO TABS
ORAL_TABLET | ORAL | 1 refills | Status: DC
Start: 2022-12-16 — End: 2023-06-28

## 2022-12-16 MED ORDER — TRAMADOL HCL 50 MG PO TABS: ORAL_TABLET | ORAL | 3 refills | Status: DC

## 2022-12-16 MED ORDER — FAMOTIDINE 40 MG PO TABS: 40.0000 mg | ORAL_TABLET | Freq: Two times a day (BID) | ORAL | 1 refills | Status: DC

## 2022-12-16 MED ORDER — ALBUTEROL SULFATE HFA 108 (90 BASE) MCG/ACT IN AERS
INHALATION_SPRAY | RESPIRATORY_TRACT | 1 refills | Status: DC
Start: 2022-12-16 — End: 2023-08-24

## 2022-12-16 MED ORDER — OMEPRAZOLE 40 MG PO CPDR: 40.0000 mg | DELAYED_RELEASE_CAPSULE | Freq: Every day | ORAL | 1 refills | Status: DC

## 2022-12-16 MED ORDER — DILTIAZEM HCL ER COATED BEADS 240 MG PO CP24: 240.0000 mg | ORAL_CAPSULE | Freq: Every day | ORAL | 1 refills | Status: DC

## 2022-12-16 MED ORDER — DILTIAZEM HCL ER COATED BEADS 120 MG PO CP24: 120.0000 mg | ORAL_CAPSULE | Freq: Every morning | ORAL | 3 refills | Status: DC

## 2022-12-16 NOTE — Patient Instructions (Signed)
Medication Instructions:  Your physician recommends that you continue on your current medications as directed. Please refer to the Current Medication list given to you today.  *If you need a refill on your cardiac medications before your next appointment, please call your pharmacy*   Lab Work: None If you have labs (blood work) drawn today and your tests are completely normal, you will receive your results only by: MyChart Message (if you have MyChart) OR A paper copy in the mail If you have any lab test that is abnormal or we need to change your treatment, we will call you to review the results.   Testing/Procedures: None   Follow-Up: At [facility], you and your health needs are our priority.  As part of our continuing mission to provide you with exceptional heart care, we have created designated Provider Care Teams.  These Care Teams include your primary Cardiologist (physician) and Advanced Practice Providers (APPs -  Physician Assistants and Nurse Practitioners) who all work together to provide you with the care you need, when you need it.  We recommend signing up for the patient portal called "MyChart".  Sign up information is provided on this After Visit Summary.  MyChart is used to connect with patients for Virtual Visits (Telemedicine).  Patients are able to view lab/test results, encounter notes, upcoming appointments, etc.  Non-urgent messages can be sent to your provider as well.   To learn more about what you can do with MyChart, go to [email or web address].    Your next appointment:   6 month(s)  Provider:   Norman Herrlich, MD    Other Instructions Use OTC Magnesium 200 mg or greater daily.

## 2022-12-16 NOTE — Progress Notes (Signed)
Provider: Richarda Blade FNP-C   Analyssa Downs, Donalee Citrin, NP  Patient Care Team: Ambriella Kitt, Donalee Citrin, NP as PCP - General (Family Medicine) Baldo Daub, MD as Referring Physician (Cardiology) Camille Bal, MD as Consulting Physician (Nephrology)  Extended Emergency Contact Information Primary Emergency Contact: Celia,Sotero Address: [address]          [place] [address] [place] Home Phone: [phone] Mobile Phone: [phone] Relation: Spouse Secondary Emergency Contact: Droessler,Sagi Mobile Phone: [phone] Relation: Daughter  Code Status:  Full Code Goals of care: Advanced Directive information    09/23/2021    9:05 AM  Advanced Directives  Does Patient Have a Medical Advance Directive? No  Would patient like information on creating a medical advance directive? No - Patient declined     Chief Complaint  Patient presents with   Medical Management of Chronic Issues    Patient presents today for a 14 month follow-up   Quality Metric Gaps    AWV, mammogram, DEXA scan, TDAP, zoster    HPI:  Pt is a 67 y.o. female seen today for medical management of chronic diseases. States had travelled home in [place] She denies any new acute issues. Recent lab work reviewed and discussed during the visit.Lab work unremarkable except glucose and Hgb A1C were 5.9  higher than previous level. She is due for mammogram and bone density. Also due for tetanus and zoster vaccine.  Past Medical History:  Diagnosis Date   Acute maxillary sinusitis 11/12/2015   Allergy    Anxiety state 05/17/2011   AR (allergic rhinitis) 02/17/2012   Asthma    BMI 31.0-31.9,adult 03/23/2011   Cough 03/23/2011   Dental disease 02/17/2012   [place] in [place] 01/2012    Essential hypertension, benign 03/23/2011   GERD (gastroesophageal reflux disease) 05/17/2011   H/O varicella    History of measles, mumps, or rubella    Hyperlipemia 03/23/2011   Hyperlipidemia     Hypertension    Hypertensive disorder 09/30/2016   Insomnia 05/17/2011   Mid back pain 02/17/2012   Neck pain 02/17/2012   Obesity 09/30/2016   Pelvic floor relaxation 09/30/2016   RAD (reactive airway disease) 02/17/2012   Past Surgical History:  Procedure Laterality Date   REPLACEMENT TOTAL KNEE Right    TOTAL KNEE ARTHROPLASTY Left 01/07/2020   Procedure: TOTAL KNEE ARTHROPLASTY;  Surgeon: Ollen Gross, MD;  Location: [facility];  Service: Orthopedics;  Laterality: Left;    TUBAL LIGATION     VAGINAL HYSTERECTOMY     uterine prolapse; DUB; ovaries intact.  [place]    Allergies  Allergen Reactions   Ace Inhibitors Cough   Iodinated Contrast Media Hives    Chills   Lisinopril Cough   Sulfa Antibiotics Hives   Iodine Hives   Red Dye #40 (Allura Red)    Tape     Surgical tape causes itching   Yellow Dye    Bactrim Rash   Flexeril [Cyclobenzaprine Hcl] Nausea Only   Latex Itching   Naprosyn [Naproxen] Nausea And Vomiting    And high doses of NSAIDS   Sulfamethoxazole-Trimethoprim Rash    Allergies as of 12/16/2022       Reactions   Ace Inhibitors Cough   Iodinated Contrast Media Hives   Chills   Lisinopril Cough   Sulfa Antibiotics Hives   Iodine Hives   Red Dye #40 (allura Red)    Tape    Surgical tape causes itching   Yellow Dye  Bactrim Rash   Flexeril [cyclobenzaprine Hcl] Nausea Only   Latex Itching   Naprosyn [naproxen] Nausea And Vomiting   And high doses of NSAIDS   Sulfamethoxazole-trimethoprim Rash        Medication List        Accurate as of December 16, 2022 11:22 AM. If you have any questions, ask your nurse or doctor.          albuterol 108 (90 Base) MCG/ACT inhaler Commonly known as: Proventil HFA INHALE 2 PUFFS INTO THE LUNGS EVERY 6 HOURS AS NEEDED FOR WHEEZING OR SHORTNESS OF BREATH   aMILoride 5 MG tablet Commonly known as: MIDAMOR TAKE 1 TABLET (5 MG TOTAL) BY MOUTH DAILY.   amoxicillin-clavulanate 875-125 MG tablet Commonly  known as: AUGMENTIN Take 1 tablet by mouth 2 (two) times daily.   baclofen 10 MG tablet Commonly known as: LIORESAL TAKE 1 TABLET BY MOUTH 3 TIMES A DAY AS NEEDED FOR MUSCLE RELAXANT FOR NECK   carvedilol 12.5 MG tablet Commonly known as: COREG Take 1 tablet (12.5 mg total) by mouth 2 (two) times daily.   cloNIDine 0.1 MG tablet Commonly known as: CATAPRES Take 1 tablet (0.1 mg total) by mouth at bedtime.   diazepam 10 MG tablet Commonly known as: VALIUM TAKE 1 TABLET BY MOUTH EVERY DAY AS NEEDED FOR MUSCLE SPASM   diltiazem 120 MG 24 hr capsule Commonly known as: CARDIZEM CD Take 1 capsule (120 mg total) by mouth every morning.   diltiazem 240 MG 24 hr capsule Commonly known as: CARDIZEM CD Take 1 capsule (240 mg total) by mouth at bedtime.   famotidine 40 MG tablet Commonly known as: PEPCID Take 1 tablet (40 mg total) by mouth 2 (two) times daily.   fluticasone 50 MCG/ACT nasal spray Commonly known as: FLONASE PLACE 2 SPRAYS IN THE NOSTRIL EVERY DAY What changed:  how much to take how to take this when to take this   hydrALAZINE 25 MG tablet Commonly known as: APRESOLINE Take 1 tablet (25 mg total) by mouth 3 (three) times daily.   hydrOXYzine 50 MG tablet Commonly known as: ATARAX Take 1 tablet (50 mg total) by mouth 2 (two) times daily.   ibuprofen 800 MG tablet Commonly known as: ADVIL TAKE 1 TABLET (800 MG TOTAL) BY MOUTH IN THE MORNING, AT NOON, AND AT BEDTIME.   lactulose 10 GM/15ML solution Commonly known as: CHRONULAC Take 15 mLs (10 g total) by mouth 3 (three) times daily.   montelukast 10 MG tablet Commonly known as: SINGULAIR TAKE 1 TABLET BY MOUTH EVERYDAY AT BEDTIME   omeprazole 40 MG capsule Commonly known as: PRILOSEC TAKE 1 CAPSULE (40 MG TOTAL) BY MOUTH DAILY.   potassium chloride 10 MEQ tablet Commonly known as: KLOR-CON Take 2 tablets (20 mEq total) by mouth daily.   rosuvastatin 40 MG tablet Commonly known as: CRESTOR TAKE 1  TABLET BY MOUTH EVERY DAY   traMADol 50 MG tablet Commonly known as: ULTRAM TAKE 1 TABLET BY MOUTH EVERY 12 HOURS AS NEEDED FOR MODERATE PAIN   zinc sulfate 220 (50 Zn) MG capsule Take 1 capsule (220 mg total) by mouth daily.        Review of Systems  Constitutional:  Negative for appetite change, chills, fatigue, fever and unexpected weight change.  HENT:  Negative for congestion, dental problem, ear discharge, ear pain, facial swelling, hearing loss, nosebleeds, postnasal drip, rhinorrhea, sinus pressure, sinus pain, sneezing, sore throat, tinnitus and trouble swallowing.   Eyes:  Negative for pain, discharge, redness, itching and visual disturbance.  Respiratory:  Negative for cough, chest tightness, shortness of breath and wheezing.   Cardiovascular:  Negative for chest pain, palpitations and leg swelling.  Gastrointestinal:  Negative for abdominal distention, abdominal pain, blood in stool, constipation, diarrhea, nausea and vomiting.  Endocrine: Negative for cold intolerance, heat intolerance, polydipsia, polyphagia and polyuria.  Genitourinary:  Negative for difficulty urinating, dysuria, flank pain, frequency and urgency.  Musculoskeletal:  Negative for arthralgias, back pain, gait problem, joint swelling, myalgias, neck pain and neck stiffness.  Skin:  Negative for color change, pallor, rash and wound.  Neurological:  Negative for dizziness, syncope, speech difficulty, weakness, light-headedness, numbness and headaches.  Hematological:  Does not bruise/bleed easily.  Psychiatric/Behavioral:  Negative for agitation, behavioral problems, confusion, hallucinations, self-injury, sleep disturbance and suicidal ideas. The patient is not nervous/anxious.     Immunization History  Administered Date(s) Administered   Influenza, High Dose Seasonal PF 11/30/2022   Influenza,inj,Quad PF,6+ Mos 12/14/2018, 10/18/2019   Influenza,inj,quad, With Preservative 11/16/2014, 11/16/2018    Influenza-Unspecified 11/13/2014, 12/17/2015, 12/16/2016   Moderna Covid-19 Fall Seasonal Vaccine 65yrs & older 11/30/2022   Moderna Sars-Covid-2 Vaccination 02/26/2019, 03/24/2019, 01/03/2020, 07/05/2020   PFIZER(Purple Top)SARS-COV-2 Vaccination 11/19/2020   PNEUMOCOCCAL CONJUGATE-20 02/02/2021   Td 12/17/2006   Zoster Recombinant(Shingrix) 02/02/2021   Pertinent  Health Maintenance Due  Topic Date Due   DEXA SCAN  Never done   MAMMOGRAM  08/30/2021   Colonoscopy  03/29/2029   INFLUENZA VACCINE  Completed      04/18/2020    1:11 PM 09/29/2020    8:24 AM 11/27/2020   10:42 AM 12/25/2020   10:05 AM 09/23/2021    9:04 AM  Fall Risk  Falls in the past year? 0 0 0 0 0  Was there an injury with Fall?  0 0 0 0  Fall Risk Category Calculator  0 0 0 0  Fall Risk Category (Retired)  Low Low Low Low  (RETIRED) Patient Fall Risk Level  Low fall risk Low fall risk Low fall risk Low fall risk  Patient at Risk for Falls Due to  No Fall Risks No Fall Risks No Fall Risks No Fall Risks  Fall risk Follow up  Falls evaluation completed Falls evaluation completed Falls evaluation completed Falls evaluation completed   Functional Status Survey:    Vitals:   12/16/22 1042  BP: 128/66  Pulse: 78  Resp: 20  Temp: 97.6 F (36.4 C)  SpO2: 98%  Weight: 164 lb 8 oz (74.6 kg)  Height: 4\' 11"  (1.499 m)   Body mass index is 33.22 kg/m. Physical Exam Vitals reviewed.  Constitutional:      General: She is not in acute distress.    Appearance: Normal appearance. She is obese. She is not ill-appearing or diaphoretic.  HENT:     Head: Normocephalic.     Right Ear: Tympanic membrane, ear canal and external ear normal. There is no impacted cerumen.     Left Ear: Tympanic membrane, ear canal and external ear normal. There is no impacted cerumen.     Nose: Nose normal. No congestion or rhinorrhea.     Mouth/Throat:     Mouth: Mucous membranes are moist.     Pharynx: Oropharynx is clear. No  oropharyngeal exudate or posterior oropharyngeal erythema.  Eyes:     General: No scleral icterus.       Right eye: No discharge.        Left eye: No  discharge.     Extraocular Movements: Extraocular movements intact.     Conjunctiva/sclera: Conjunctivae normal.     Pupils: Pupils are equal, round, and reactive to light.  Neck:     Vascular: No carotid bruit.  Cardiovascular:     Rate and Rhythm: Normal rate and regular rhythm.     Pulses: Normal pulses.     Heart sounds: Normal heart sounds. No murmur heard.    No friction rub. No gallop.  Pulmonary:     Effort: Pulmonary effort is normal. No respiratory distress.     Breath sounds: Normal breath sounds. No wheezing, rhonchi or rales.  Chest:     Chest wall: No tenderness.  Abdominal:     General: Bowel sounds are normal. There is no distension.     Palpations: Abdomen is soft. There is no mass.     Tenderness: There is no abdominal tenderness. There is no right CVA tenderness, left CVA tenderness, guarding or rebound.  Musculoskeletal:        General: No swelling or tenderness. Normal range of motion.     Cervical back: Normal range of motion. No rigidity or tenderness.     Right lower leg: No edema.     Left lower leg: No edema.  Lymphadenopathy:     Cervical: No cervical adenopathy.  Skin:    General: Skin is warm and dry.     Coloration: Skin is not pale.     Findings: No bruising, erythema, lesion or rash.  Neurological:     Mental Status: She is alert and oriented to person, place, and time.     Cranial Nerves: No cranial nerve deficit.     Sensory: No sensory deficit.     Motor: No weakness.     Coordination: Coordination normal.     Gait: Gait normal.  Psychiatric:        Mood and Affect: Mood normal.        Speech: Speech normal.        Behavior: Behavior normal.        Thought Content: Thought content normal.        Judgment: Judgment normal.     Labs reviewed: Recent Labs    05/27/22 0729  11/23/22 1056  NA 144 142  K 4.0 4.8  CL 110* 112*  CO2 19* 23  GLUCOSE 90 101*  BUN 29* 17  CREATININE 0.95 0.88  CALCIUM 9.0 9.6   Recent Labs    05/27/22 0729 11/23/22 1056  AST 17 18  ALT 20 12  ALKPHOS 101  --   BILITOT 0.3 0.3  PROT 6.5 7.0  ALBUMIN 4.2  --    Recent Labs    11/23/22 1056  WBC 5.5  NEUTROABS 3,031  HGB 12.8  HCT 41.9  MCV 85.0  PLT 228   Lab Results  Component Value Date   TSH 0.84 11/23/2022   Lab Results  Component Value Date   HGBA1C 5.9 (H) 11/23/2022   Lab Results  Component Value Date   CHOL 154 11/23/2022   HDL 63 11/23/2022   LDLCALC 75 11/23/2022   TRIG 80 11/23/2022   CHOLHDL 2.4 11/23/2022    Significant Diagnostic Results in last 30 days:  No results found.  Assessment/Plan 1. Essential hypertension Blood pressure well-controlled - Continue on hydralazine, diltiazem,Hydralazine and carvedilol - continue to monitor  - diltiazem (CARDIZEM CD) 120 MG 24 hr capsule; Take 1 capsule (120 mg total) by mouth every morning.  Dispense: 90 capsule; Refill: 3 - diltiazem (CARDIZEM CD) 240 MG 24 hr capsule; Take 1 capsule (240 mg total) by mouth at bedtime.  Dispense: 180 capsule; Refill: 1 - hydrALAZINE (APRESOLINE) 25 MG tablet; Take 1 tablet (25 mg total) by mouth 3 (three) times daily.  Dispense: 270 tablet; Refill: 3  2. Mixed hyperlipidemia LDL within normal  - continue Rosuvastatin  - rosuvastatin (CRESTOR) 40 MG tablet; Take 1 tablet (40 mg total) by mouth daily.  Dispense: 90 tablet; Refill: 3  3. Prediabetes Lab Results  Component Value Date   HGBA1C 5.9 (H) 11/23/2022  .-Dietary modification and exercise advised  4. Gastroesophageal reflux disease without esophagitis Symptoms controlled. H/H stable.No tarry or black stool  - advised to avoid eating meals late in the evening and to avoid aggravating foods and spices. - continue on Omeprazole  - famotidine (PEPCID) 40 MG tablet; Take 1 tablet (40 mg total) by  mouth 2 (two) times daily.  Dispense: 180 tablet; Refill: 1 - omeprazole (PRILOSEC) 40 MG capsule; Take 1 capsule (40 mg total) by mouth daily.  Dispense: 90 capsule; Refill: 1  5. Mild intermittent asthma without complication Breathing stable -Continue albuterol and montelukast - albuterol (PROVENTIL HFA) 108 (90 Base) MCG/ACT inhaler; INHALE 2 PUFFS INTO THE LUNGS EVERY 6 HOURS AS NEEDED FOR WHEEZING OR SHORTNESS OF BREATH  Dispense: 18 each; Refill: 1 - montelukast (SINGULAIR) 10 MG tablet; Take 1 tablet (10 mg total) by mouth at bedtime.  Dispense: 180 tablet; Refill: 1  6. Chronic neck pain Chronic -Continue on baclofen - baclofen (LIORESAL) 10 MG tablet; TAKE 1 TABLET BY MOUTH 3 TIMES A DAY AS NEEDED FOR MUSCLE RELAXANT FOR NECK  Dispense: 270 tablet; Refill: 1  7. Non-seasonal allergic rhinitis, unspecified trigger Continue on Flonase - fluticasone (FLONASE) 50 MCG/ACT nasal spray; Place 2 sprays into both nostrils daily. PLACE 2 SPRAYS IN THE NOSTRIL EVERY DAY  Dispense: 16 g; Refill: 3  8. Breast cancer screening by mammogram Asymptomatic - MM DIGITAL SCREENING BILATERAL  9. Postmenopausal estrogen deficiency No fall or fractures - DG Bone Density  Family/ staff Communication: Reviewed plan of care with patient verbalized understanding  Labs/tests ordered:  - MM DIGITAL SCREENING BILATERAL - DG Bone Density  Next Appointment : Return in about 6 months (around 06/15/2023) for medical mangement of chronic issues., Fasting labs in 6 months prior to visit.   Caesar Bookman, NP

## 2023-02-04 DIAGNOSIS — Z780 Asymptomatic menopausal state: Secondary | ICD-10-CM | POA: Diagnosis not present

## 2023-02-04 DIAGNOSIS — M8588 Other specified disorders of bone density and structure, other site: Secondary | ICD-10-CM | POA: Diagnosis not present

## 2023-02-04 DIAGNOSIS — Z1231 Encounter for screening mammogram for malignant neoplasm of breast: Secondary | ICD-10-CM | POA: Diagnosis not present

## 2023-02-04 LAB — HM DEXA SCAN

## 2023-02-04 LAB — HM MAMMOGRAPHY

## 2023-02-07 ENCOUNTER — Encounter: Payer: Self-pay | Admitting: Family

## 2023-02-08 ENCOUNTER — Telehealth: Payer: Self-pay | Admitting: Family

## 2023-02-08 NOTE — Telephone Encounter (Signed)
-----   Message from [facility] Brita Romp sent at 02/07/2023  9:23 AM EST ----- Abstracted to Chart.

## 2023-02-08 NOTE — Telephone Encounter (Signed)
Bone density results received from [facility] mammography indicates Osteopenia. Start on over-the-counter calcium citrate with vitamin D 250 - 5 mg tablet take 1 by mouth twice daily - reduce Omeprazole from 40 mg capsule to 20 mg capsule one by mouth daily to prevent worsening of bone density  - continue with weight bearing exercise such as fast pace walking and lifting small weights.

## 2023-02-10 NOTE — Telephone Encounter (Signed)
Tried calling patient, LMOM to return call.  Forwarded message to Clinical Pool.

## 2023-02-11 NOTE — Telephone Encounter (Signed)
Tried calling patient, LMOM to return call.

## 2023-02-11 NOTE — Telephone Encounter (Signed)
noted 

## 2023-02-15 ENCOUNTER — Encounter: Payer: Self-pay | Admitting: *Deleted

## 2023-02-15 NOTE — Telephone Encounter (Signed)
Tried calling patient, LMOM to return call.  Also sent MyChart Message with Results.

## 2023-03-10 ENCOUNTER — Other Ambulatory Visit: Payer: Self-pay | Admitting: Family

## 2023-03-10 NOTE — Telephone Encounter (Signed)
Patient is requesting a refill of the following medications: Requested Prescriptions   Pending Prescriptions Disp Refills   diazepam (VALIUM) 10 MG tablet [Pharmacy Med Name: DIAZEPAM 10 MG TABLET] 30 tablet 0    Sig: TAKE 1 TABLET BY MOUTH EVERY DAY AS NEEDED FOR MUSCLE SPASM    Date of last refill: 12/16/2022  Refill amount: 30 tabs/0 refill  Treatment agreement date: 2022 need update.

## 2023-03-11 NOTE — Telephone Encounter (Signed)
Refilled

## 2023-05-12 ENCOUNTER — Other Ambulatory Visit: Payer: Self-pay | Admitting: Family

## 2023-05-12 NOTE — Telephone Encounter (Signed)
 Pharmacy requested refill.  Epic LR: 03/11/23 Contract Date: 04/18/20 Note added to upcoming appointment to update.   Pended Rx and sent to [facility] for approval.

## 2023-06-20 ENCOUNTER — Other Ambulatory Visit: Payer: Medicare Other

## 2023-06-20 DIAGNOSIS — E782 Mixed hyperlipidemia: Secondary | ICD-10-CM

## 2023-06-20 DIAGNOSIS — R7303 Prediabetes: Secondary | ICD-10-CM

## 2023-06-20 DIAGNOSIS — I10 Essential (primary) hypertension: Secondary | ICD-10-CM

## 2023-06-20 DIAGNOSIS — K219 Gastro-esophageal reflux disease without esophagitis: Secondary | ICD-10-CM

## 2023-06-23 ENCOUNTER — Ambulatory Visit: Payer: Medicare Other | Admitting: Family

## 2023-06-28 ENCOUNTER — Telehealth: Payer: Self-pay

## 2023-06-28 ENCOUNTER — Other Ambulatory Visit: Payer: Self-pay | Admitting: Family

## 2023-06-28 DIAGNOSIS — G8929 Other chronic pain: Secondary | ICD-10-CM

## 2023-06-28 DIAGNOSIS — K219 Gastro-esophageal reflux disease without esophagitis: Secondary | ICD-10-CM

## 2023-06-28 NOTE — Telephone Encounter (Signed)
 Call patient to make an appointment due to medication refills. Left message on voicemail for patient to return call when available

## 2023-06-28 NOTE — Telephone Encounter (Signed)
 High Risk Warning Populated when attempting to refill, I will send to Provider for further review. Patient has been contacted through phone and MyChart message.   Message sent to Ngetich, Dinah C, NP

## 2023-07-29 ENCOUNTER — Ambulatory Visit (INDEPENDENT_AMBULATORY_CARE_PROVIDER_SITE_OTHER): Admitting: Family

## 2023-07-29 ENCOUNTER — Encounter: Payer: Self-pay | Admitting: Family

## 2023-07-29 ENCOUNTER — Other Ambulatory Visit

## 2023-07-29 ENCOUNTER — Other Ambulatory Visit: Payer: Self-pay | Admitting: Cardiology

## 2023-07-29 VITALS — BP 128/80 | HR 79 | Temp 97.6°F | Resp 20 | Ht 59.0 in | Wt 150.4 lb

## 2023-07-29 DIAGNOSIS — J209 Acute bronchitis, unspecified: Secondary | ICD-10-CM | POA: Diagnosis not present

## 2023-07-29 DIAGNOSIS — R7303 Prediabetes: Secondary | ICD-10-CM | POA: Diagnosis not present

## 2023-07-29 DIAGNOSIS — I10 Essential (primary) hypertension: Secondary | ICD-10-CM

## 2023-07-29 DIAGNOSIS — J45909 Unspecified asthma, uncomplicated: Secondary | ICD-10-CM

## 2023-07-29 DIAGNOSIS — K219 Gastro-esophageal reflux disease without esophagitis: Secondary | ICD-10-CM | POA: Diagnosis not present

## 2023-07-29 DIAGNOSIS — E782 Mixed hyperlipidemia: Secondary | ICD-10-CM | POA: Diagnosis not present

## 2023-07-29 MED ORDER — PREDNISONE 20 MG PO TABS: ORAL_TABLET | ORAL | 0 refills | Status: AC

## 2023-07-29 MED ORDER — AZITHROMYCIN 250 MG PO TABS: ORAL_TABLET | ORAL | 0 refills | Status: AC

## 2023-07-29 NOTE — Progress Notes (Signed)
 Provider: Christean Courts FNP-C  Kyjuan Gause, Elijio Guadeloupe, NP  Patient Care Team: Tadarrius Burch, Elijio Guadeloupe, NP as PCP - General (Family Medicine) Hassan Links, MD as Referring Physician (Cardiology) Verlena Glenn, MD as Consulting Physician (Nephrology)  Extended Emergency Contact Information Primary Emergency Contact: Confer,Sotero Address: [address]          [place] [address] [place] Home Phone: [phone] Mobile Phone: [phone] Relation: Spouse Secondary Emergency Contact: Vanderbeck,Sagi Mobile Phone: [phone] Relation: Daughter  Code Status:  Full Code  Goals of care: Advanced Directive information    07/29/2023    1:44 PM  Advanced Directives  Does Patient Have a Medical Advance Directive? Yes  Type of Estate agent of [place];Living will  Does patient want to make changes to medical advance directive? No - Patient declined  Copy of Healthcare Power of Attorney in Chart? No - copy requested     Chief Complaint  Patient presents with   Cough    Sinus    Discussed the use of AI scribe software for clinical note transcription with the patient, who gave verbal consent to proceed.  History of Present Illness   Shanette A Hogen is a 68 year old female who presents with a persistent cough and chest pain.  She has experienced a cough persisting for nine days, initially attributing it to her seasonal allergies. Over the past three days, her symptoms have worsened, including chest pain when coughing and thick sputum production. The sputum is described as thick, and the cough causes pain in her back.  She has a history of using Augmentin  and prednisone  for similar symptoms in the past. She completed a seven-day course of Augmentin  and a course of prednisone , but her symptoms have not improved. She tested negative for COVID-19 yesterday.  No nausea, vomiting, fever, or significant loss of appetite, although she mentions trying  not to eat much. Shortness of breath occurs only during coughing fits, which are consecutive and cause her to feel short of breath.  She is currently taking over-the-counter medications such as Lovastocin DM and Mucinex to manage her symptoms. She also uses an inhaler and takes Singulair  regularly.  She denies any new allergies to medications or food, except for Bactrim. She mentions a history of knee replacement and reports using compression stockings for leg swelling. She denies any significant swelling at present.  She plans to return to work next Wednesday and has scheduled lab work for today, including A1c, lipid panel, TSH, CMP, and CBC. She also requests a refill for her diazepam  prescription.    Past Medical History:  Diagnosis Date   Acute maxillary sinusitis 11/12/2015   Allergy    Anxiety state 05/17/2011   AR (allergic rhinitis) 02/17/2012   Asthma    BMI 31.0-31.9,adult 03/23/2011   Cough 03/23/2011   Dental disease 02/17/2012   [place] in [place] 01/2012    Essential hypertension, benign 03/23/2011   GERD (gastroesophageal reflux disease) 05/17/2011   H/O varicella    History of measles, mumps, or rubella    Hyperlipemia 03/23/2011   Hyperlipidemia    Hypertension    Hypertensive disorder 09/30/2016   Insomnia 05/17/2011   Mid back pain 02/17/2012   Neck pain 02/17/2012   Obesity 09/30/2016   Pelvic floor relaxation 09/30/2016   RAD (reactive airway disease) 02/17/2012   Past Surgical History:  Procedure Laterality Date   REPLACEMENT TOTAL KNEE Right    TOTAL KNEE ARTHROPLASTY Left 01/07/2020   Procedure:  TOTAL KNEE ARTHROPLASTY;  Surgeon: Liliane Rei, MD;  Location: [facility];  Service: Orthopedics;  Laterality: Left;    TUBAL LIGATION     VAGINAL HYSTERECTOMY     uterine prolapse; DUB; ovaries intact.  [place]    Allergies  Allergen Reactions   Ace Inhibitors Cough   Iodinated Contrast Media Hives    Chills   Lisinopril Cough   Sulfa Antibiotics Hives    Iodine  Hives   Red Dye #40 (Allura Red)    Tape     Surgical tape causes itching   Yellow Dye    Bactrim Rash   Flexeril [Cyclobenzaprine Hcl] Nausea Only   Latex Itching   Naprosyn [Naproxen] Nausea And Vomiting    And high doses of NSAIDS   Sulfamethoxazole-Trimethoprim Rash    Outpatient Encounter Medications as of 07/29/2023  Medication Sig   albuterol  (PROVENTIL  HFA) 108 (90 Base) MCG/ACT inhaler INHALE 2 PUFFS INTO THE LUNGS EVERY 6 HOURS AS NEEDED FOR WHEEZING OR SHORTNESS OF BREATH   aMILoride  (MIDAMOR ) 5 MG tablet Take 1 tablet (5 mg total) by mouth daily.   amoxicillin -clavulanate (AUGMENTIN ) 875-125 MG tablet Take 1 tablet by mouth 2 (two) times daily.   azithromycin (ZITHROMAX) 250 MG tablet Take 2 tablets on day 1, then 1 tablet daily on days 2 through 5   baclofen  (LIORESAL ) 10 MG tablet TAKE 1 TABLET BY MOUTH 3 TIMES A DAY AS NEEDED FOR MUSCLE RELAXANT FOR NECK   carvedilol  (COREG ) 12.5 MG tablet Take 1 tablet (12.5 mg total) by mouth 2 (two) times daily.   cloNIDine  (CATAPRES ) 0.1 MG tablet TAKE 1 TABLET BY MOUTH AT BEDTIME.   diazepam  (VALIUM ) 10 MG tablet TAKE 1 TABLET BY MOUTH EVERY DAY AS NEEDED FOR MUSCLE SPASM   diltiazem  (CARDIZEM  CD) 120 MG 24 hr capsule Take 1 capsule (120 mg total) by mouth every morning.   diltiazem  (CARDIZEM  CD) 240 MG 24 hr capsule Take 1 capsule (240 mg total) by mouth at bedtime.   famotidine  (PEPCID ) 40 MG tablet TAKE 1 TABLET BY MOUTH TWICE A DAY   fluticasone  (FLONASE ) 50 MCG/ACT nasal spray Place 2 sprays into both nostrils daily. PLACE 2 SPRAYS IN THE NOSTRIL EVERY DAY   hydrALAZINE  (APRESOLINE ) 25 MG tablet Take 1 tablet (25 mg total) by mouth 3 (three) times daily.   hydrOXYzine  (ATARAX ) 50 MG tablet Take 1 tablet (50 mg total) by mouth 2 (two) times daily.   ibuprofen  (ADVIL ) 800 MG tablet Take 1 tablet (800 mg total) by mouth in the morning, at noon, and at bedtime.   lactulose  (CHRONULAC ) 10 GM/15ML solution Take 15 mLs (10 g  total) by mouth 3 (three) times daily.   montelukast  (SINGULAIR ) 10 MG tablet Take 1 tablet (10 mg total) by mouth at bedtime.   omeprazole  (PRILOSEC) 40 MG capsule TAKE 1 CAPSULE (40 MG TOTAL) BY MOUTH DAILY.   potassium chloride  (KLOR-CON ) 10 MEQ tablet Take 2 tablets (20 mEq total) by mouth daily.   predniSONE  (DELTASONE ) 20 MG tablet Take 3 tablets (60 mg total) by mouth daily with breakfast for 1 day, THEN 2.5 tablets (50 mg total) daily with breakfast for 1 day, THEN 2 tablets (40 mg total) daily with breakfast for 1 day, THEN 1.5 tablets (30 mg total) daily with breakfast for 1 day, THEN 1 tablet (20 mg total) daily with breakfast for 1 day, THEN 0.5 tablets (10 mg total) daily with breakfast for 1 day.   rosuvastatin  (CRESTOR ) 40 MG tablet  Take 1 tablet (40 mg total) by mouth daily.   traMADol  (ULTRAM ) 50 MG tablet TAKE 1 TABLET BY MOUTH EVERY 12 HOURS AS NEEDED FOR MODERATE PAIN   zinc  sulfate 220 (50 Zn) MG capsule Take 1 capsule (220 mg total) by mouth daily.   [DISCONTINUED] cloNIDine  (CATAPRES ) 0.1 MG tablet Take 1 tablet (0.1 mg total) by mouth at bedtime.   No facility-administered encounter medications on file as of 07/29/2023.    Review of Systems  Constitutional:  Negative for appetite change, chills, fatigue, fever and unexpected weight change.  HENT:  Positive for rhinorrhea. Negative for congestion, ear discharge, ear pain, hearing loss, nosebleeds, postnasal drip, sinus pressure, sinus pain, sneezing, sore throat and tinnitus.   Eyes:  Negative for pain, discharge, redness, itching and visual disturbance.  Respiratory:  Positive for cough. Negative for chest tightness, shortness of breath and wheezing.   Cardiovascular:  Negative for chest pain, palpitations and leg swelling.  Gastrointestinal:  Negative for abdominal distention, abdominal pain, blood in stool, constipation, diarrhea, nausea and vomiting.  Genitourinary:  Negative for difficulty urinating, dysuria, flank  pain, frequency and urgency.  Musculoskeletal:  Negative for arthralgias, back pain, gait problem, joint swelling, myalgias, neck pain and neck stiffness.  Skin:  Negative for color change, pallor and rash.  Neurological:  Negative for dizziness, weakness, light-headedness and headaches.    Immunization History  Administered Date(s) Administered   Influenza, High Dose Seasonal PF 11/30/2022   Influenza,inj,Quad PF,6+ Mos 12/14/2018, 10/18/2019   Influenza,inj,quad, With Preservative 11/16/2014, 11/16/2018   Influenza-Unspecified 11/13/2014, 12/17/2015, 12/16/2016   Moderna Covid-19 Fall Seasonal Vaccine 69yrs & older 11/30/2022   Moderna Sars-Covid-2 Vaccination 02/26/2019, 03/24/2019, 01/03/2020, 07/05/2020   PFIZER(Purple Top)SARS-COV-2 Vaccination 11/19/2020   PNEUMOCOCCAL CONJUGATE-20 02/02/2021   Td 12/17/2006   Zoster Recombinant(Shingrix ) 02/02/2021   Pertinent  Health Maintenance Due  Topic Date Due   INFLUENZA VACCINE  09/16/2023   MAMMOGRAM  02/03/2025   Colonoscopy  03/29/2029   DEXA SCAN  Completed      09/29/2020    8:24 AM 11/27/2020   10:42 AM 12/25/2020   10:05 AM 09/23/2021    9:04 AM 07/29/2023    1:44 PM  Fall Risk  Falls in the past year? 0 0 0 0 0  Was there an injury with Fall? 0 0 0 0 0  Fall Risk Category Calculator 0 0 0 0 0  Fall Risk Category (Retired) Low  Low  Low  Low    (RETIRED) Patient Fall Risk Level Low fall risk  Low fall risk  Low fall risk  Low fall risk    Patient at Risk for Falls Due to No Fall Risks No Fall Risks No Fall Risks No Fall Risks No Fall Risks  Fall risk Follow up Falls evaluation completed  Falls evaluation completed  Falls evaluation completed  Falls evaluation completed  Falls evaluation completed     Data saved with a previous flowsheet row definition   Functional Status Survey:    Vitals:   07/29/23 1347  BP: 128/80  Pulse: 79  Resp: 20  Temp: 97.6 F (36.4 C)  SpO2: 95%  Weight: 150 lb 6.4 oz (68.2 kg)   Height: 4' 11 (1.499 m)   Body mass index is 30.38 kg/m. Physical Exam GENERAL: Alert, cooperative, well developed, no acute distress HEENT: Normocephalic, normal oropharynx, moist mucous membranes, nose without swelling, no sinus tenderness CHEST: diminished Clear to auscultation bilaterally, no wheezes, rhonchi, or crackles, lung secretions present clears with  cough CARDIOVASCULAR: Normal heart rate and rhythm, S1 and S2 normal without murmurs ABDOMEN: Soft, non-tender, non-distended, without organomegaly, normal bowel sounds EXTREMITIES: No cyanosis or edema, no leg swelling NEUROLOGICAL: Cranial nerves grossly intact, moves all extremities without gross motor or sensory deficit   Labs reviewed: Recent Labs    11/23/22 1056  NA 142  K 4.8  CL 112*  CO2 23  GLUCOSE 101*  BUN 17  CREATININE 0.88  CALCIUM  9.6   Recent Labs    11/23/22 1056  AST 18  ALT 12  BILITOT 0.3  PROT 7.0   Recent Labs    11/23/22 1056  WBC 5.5  NEUTROABS 3,031  HGB 12.8  HCT 41.9  MCV 85.0  PLT 228   Lab Results  Component Value Date   TSH 0.84 11/23/2022   Lab Results  Component Value Date   HGBA1C 5.9 (H) 11/23/2022   Lab Results  Component Value Date   CHOL 154 11/23/2022   HDL 63 11/23/2022   LDLCALC 75 11/23/2022   TRIG 80 11/23/2022   CHOLHDL 2.4 11/23/2022    Significant Diagnostic Results in last 30 days:  No results found.  Assessment/Plan  Acute Bronchitis with asthma  Persistent cough for nine days, worsening over the past three days, with pain, thick sputum, and chest tenderness. Completed Augmentin  and prednisone  without improvement. Differential includes bronchitis or pneumonia. COVID-19 test negative. No fever, nausea, or vomiting, but experiences shortness of breath during coughing. Seasonal allergies, uses inhalers and Singulair . Prescribed Z-Pak (azithromycin) due to efficacy in chest infections and previous tolerance despite dye allergies. Doxycycline  discussed as an alternative, but Z-Pak preferred for chest involvement. - Prescribe Z-Pak (azithromycin) for five days. - Advise continuation of Mucinex twice daily to loosen secretions. - Encourage rest and hydration. - Continue use of albuterol  inhaler as needed.  General Health Maintenance Scheduled for routine lab work including A1c, lipid panel, TSH, CMP, and CBC. Importance of these tests for monitoring overall health and managing chronic conditions discussed. - Perform lab work including A1c, lipid panel, TSH, CMP, and CBC.  Follow-up  plans to return to work next Wednesday, allowing time for rest and recovery. - Ensure lab work is completed today. - Advise rest until returning to work next Wednesday.  Family/ staff Communication: Reviewed plan of care with patient verbalized understanding   Labs/tests ordered: Has lab orders in place   Next Appointment: Return if symptoms worsen or fail to improve.   Total time: 20 minutes. Greater than 50% of total time spent doing patient education regarding cough health maintenance including symptom/medication management.   Estil Heman, NP

## 2023-07-30 LAB — CBC WITH DIFFERENTIAL/PLATELET
Absolute Lymphocytes: 1841 {cells}/uL (ref 850–3900)
Absolute Monocytes: 481 {cells}/uL (ref 200–950)
Basophils Absolute: 49 {cells}/uL (ref 0–200)
Basophils Relative: 0.9 %
Eosinophils Absolute: 470 {cells}/uL (ref 15–500)
Eosinophils Relative: 8.7 %
HCT: 37.3 % (ref 35.0–45.0)
Hemoglobin: 11.4 g/dL — ABNORMAL LOW (ref 11.7–15.5)
MCH: 26.6 pg — ABNORMAL LOW (ref 27.0–33.0)
MCHC: 30.6 g/dL — ABNORMAL LOW (ref 32.0–36.0)
MCV: 86.9 fL (ref 80.0–100.0)
MPV: 10.7 fL (ref 7.5–12.5)
Monocytes Relative: 8.9 %
Neutro Abs: 2560 {cells}/uL (ref 1500–7800)
Neutrophils Relative %: 47.4 %
Platelets: 197 10*3/uL (ref 140–400)
RBC: 4.29 10*6/uL (ref 3.80–5.10)
RDW: 13.5 % (ref 11.0–15.0)
Total Lymphocyte: 34.1 %
WBC: 5.4 10*3/uL (ref 3.8–10.8)

## 2023-07-30 LAB — LIPID PANEL
Cholesterol: 139 mg/dL (ref <200)
HDL: 66 mg/dL (ref >=50)
LDL Cholesterol (Calc): 55 mg/dL
Non-HDL Cholesterol (Calc): 73 mg/dL (ref <130)
Total CHOL/HDL Ratio: 2.1 (calc) (ref <5.0)
Triglycerides: 97 mg/dL (ref <150)

## 2023-07-30 LAB — HEMOGLOBIN A1C
Hgb A1c MFr Bld: 5.8 % — ABNORMAL HIGH (ref <5.7)
Mean Plasma Glucose: 120 mg/dL
eAG (mmol/L): 6.6 mmol/L

## 2023-07-30 LAB — COMPLETE METABOLIC PANEL WITHOUT GFR
AG Ratio: 1.6 (calc) (ref 1.0–2.5)
ALT: 14 U/L (ref 6–29)
AST: 16 U/L (ref 10–35)
Albumin: 3.9 g/dL (ref 3.6–5.1)
Alkaline phosphatase (APISO): 75 U/L (ref 37–153)
BUN/Creatinine Ratio: 36 (calc) — ABNORMAL HIGH (ref 6–22)
BUN: 30 mg/dL — ABNORMAL HIGH (ref 7–25)
CO2: 24 mmol/L (ref 20–32)
Calcium: 8.8 mg/dL (ref 8.6–10.4)
Chloride: 110 mmol/L (ref 98–110)
Creat: 0.83 mg/dL (ref 0.50–1.05)
Globulin: 2.4 g/dL (ref 1.9–3.7)
Glucose, Bld: 131 mg/dL — ABNORMAL HIGH (ref 65–99)
Potassium: 4.2 mmol/L (ref 3.5–5.3)
Sodium: 142 mmol/L (ref 135–146)
Total Bilirubin: 0.2 mg/dL (ref 0.2–1.2)
Total Protein: 6.3 g/dL (ref 6.1–8.1)

## 2023-07-30 LAB — TSH: TSH: 0.78 m[IU]/L (ref 0.40–4.50)

## 2023-08-01 ENCOUNTER — Other Ambulatory Visit: Payer: Self-pay | Admitting: Family

## 2023-08-01 NOTE — Telephone Encounter (Unsigned)
 Copied from CRM [id number]. Topic: Clinical - Medication Refill >> Aug 01, 2023 11:59 AM Shelby Dessert H wrote: Medication: diazepam  (VALIUM ) 10 MG tablet  Has the patient contacted their pharmacy? Yes (Agent: If no, request that the patient contact the pharmacy for the refill. If patient does not wish to contact the pharmacy document the reason why and proceed with request.) (Agent: If yes, when and what did the pharmacy advise?)  This is the patient's preferred pharmacy:  [id number] [place], [place] - [address] [place] [address] Phone: [phone] Fax: [phone]  Is this the correct pharmacy for this prescription? Yes If no, delete pharmacy and type the correct one.   Has the prescription been filled recently? No  Is the patient out of the medication? No  Has the patient been seen for an appointment in the last year OR does the patient have an upcoming appointment? Yes  Can we respond through MyChart? Yes  Agent: Please be advised that Rx refills may take up to 3 business days. We ask that you follow-up with your pharmacy.

## 2023-08-01 NOTE — Telephone Encounter (Signed)
 Patient has request for refill on Valium . Patient last refill was 05/12/2023. Patient has no contract on file. Patient has no upcoming appointment.  Medication pend and sent to PCP (Ngetich, Dinah C, NP) for approval.

## 2023-08-02 ENCOUNTER — Ambulatory Visit: Payer: Self-pay | Admitting: Family

## 2023-08-02 DIAGNOSIS — D582 Other hemoglobinopathies: Secondary | ICD-10-CM

## 2023-08-02 MED ORDER — DIAZEPAM 10 MG PO TABS: ORAL_TABLET | ORAL | 3 refills | Status: DC

## 2023-08-07 ENCOUNTER — Other Ambulatory Visit: Payer: Self-pay | Admitting: Family

## 2023-08-07 DIAGNOSIS — I10 Essential (primary) hypertension: Secondary | ICD-10-CM

## 2023-08-11 ENCOUNTER — Telehealth

## 2023-08-11 NOTE — Telephone Encounter (Signed)
 MyChart message sent to patient as she requested. We need to confirm she is coming into office for additional lab in 8 weeks.

## 2023-08-11 NOTE — Telephone Encounter (Signed)
 Copied from CRM [id number]. Topic: Clinical - Lab/Test Results >> Aug 11, 2023  1:08 PM Diannia H wrote: Reason for CRM: Patient had the labs given to her on 06/17, she said that its crazy to call her a prediabetic and if that's what its about she's good but if its something else send her a MyChart message.

## 2023-08-24 ENCOUNTER — Other Ambulatory Visit: Payer: Self-pay | Admitting: Family

## 2023-08-24 DIAGNOSIS — G8929 Other chronic pain: Secondary | ICD-10-CM

## 2023-08-24 DIAGNOSIS — J4520 Mild intermittent asthma, uncomplicated: Secondary | ICD-10-CM

## 2023-08-24 NOTE — Telephone Encounter (Signed)
 Patient has request refill on medication Baclofen . Patient last given 90 day supply with no refills on 06/28/2023. Medication pend and sent to Harlene An, NP.

## 2023-09-02 ENCOUNTER — Encounter: Payer: Self-pay | Admitting: Advanced Practice Midwife

## 2023-10-04 ENCOUNTER — Other Ambulatory Visit: Payer: Self-pay | Admitting: Family

## 2023-10-04 DIAGNOSIS — K219 Gastro-esophageal reflux disease without esophagitis: Secondary | ICD-10-CM

## 2023-10-04 MED ORDER — AMILORIDE HCL 5 MG PO TABS: 5.0000 mg | ORAL_TABLET | Freq: Every day | ORAL | 1 refills | Status: DC

## 2023-10-04 MED ORDER — OMEPRAZOLE 40 MG PO CPDR: 40.0000 mg | DELAYED_RELEASE_CAPSULE | Freq: Every day | ORAL | 0 refills | Status: DC

## 2023-10-04 NOTE — Telephone Encounter (Signed)
 Copied from CRM [id number]. Topic: Clinical - Medication Refill >> Oct 04, 2023 12:21 PM Carmell R wrote: Medication:  omeprazole  (PRILOSEC) 40 MG capsule   aMILoride  (MIDAMOR ) 5 MG tablet These should both be 90 days supply.   Has the patient contacted their pharmacy? Yes, says she needs new rxs.  This is the patient's preferred pharmacy:  [id number] [place], [place] - [address] [place] [address] Phone: [phone] Fax: [phone]  Is this the correct pharmacy for this prescription? Yes  Has the prescription been filled recently? Yes  Is the patient out of the medication? Yes the amiloride  5 mg.   Has the patient been seen for an appointment in the last year OR does the patient have an upcoming appointment? Yes  Can we respond through MyChart? Yes  Agent: Please be advised that Rx refills may take up to 3 business days. We ask that you follow-up with your pharmacy.

## 2023-10-06 ENCOUNTER — Other Ambulatory Visit: Payer: Self-pay | Admitting: Family

## 2023-11-30 ENCOUNTER — Other Ambulatory Visit: Payer: Self-pay

## 2023-11-30 ENCOUNTER — Telehealth: Payer: Self-pay | Admitting: Cardiology

## 2023-11-30 DIAGNOSIS — I10 Essential (primary) hypertension: Secondary | ICD-10-CM

## 2023-11-30 DIAGNOSIS — E782 Mixed hyperlipidemia: Secondary | ICD-10-CM

## 2023-11-30 NOTE — Telephone Encounter (Signed)
 Called the patient and informed her of the labs that Dr. Monetta had ordered below:  CMP lipids Apo B  Patient verbalized understanding and had no further questions at this time.

## 2023-11-30 NOTE — Telephone Encounter (Signed)
 Pt requesting to have lab work orders placed in the system before her upcoming appt and would like a c/b.

## 2023-12-01 ENCOUNTER — Telehealth: Payer: Self-pay | Admitting: Cardiology

## 2023-12-01 NOTE — Telephone Encounter (Signed)
 Patient called to request [facility] lab orders also be placed for her at [facility], fax# [phone] and wants a call back to confirm lab sent or send message in MyChart.

## 2023-12-06 ENCOUNTER — Encounter: Payer: Self-pay | Admitting: Cardiology

## 2023-12-06 ENCOUNTER — Ambulatory Visit: Admitting: Cardiology

## 2023-12-06 ENCOUNTER — Other Ambulatory Visit: Payer: Self-pay

## 2023-12-06 DIAGNOSIS — E782 Mixed hyperlipidemia: Secondary | ICD-10-CM

## 2023-12-06 NOTE — Telephone Encounter (Signed)
 Patient is requesting to have an A1C and other lab work done in preparation for here appointment. What labs would you like to order for her?

## 2023-12-12 NOTE — Telephone Encounter (Signed)
 Called the patient and informed her regarding the lab work that Dr. Monetta had recommended below:  CMP lipid Hgb A1c and Apo B  Patient verbalized understanding and the lab orders were faxed to [facility] labs per the patient request. Patient had no further questions at this time.

## 2024-01-01 ENCOUNTER — Other Ambulatory Visit: Payer: Self-pay | Admitting: Family

## 2024-01-01 DIAGNOSIS — I10 Essential (primary) hypertension: Secondary | ICD-10-CM

## 2024-01-02 ENCOUNTER — Other Ambulatory Visit: Payer: Self-pay | Admitting: Family

## 2024-01-02 ENCOUNTER — Ambulatory Visit: Admitting: Cardiology

## 2024-01-02 DIAGNOSIS — E782 Mixed hyperlipidemia: Secondary | ICD-10-CM

## 2024-01-02 NOTE — Telephone Encounter (Signed)
 High risk or very high risk warning populated when attempting to refill medication. RX request sent to PCP for review and approval if warranted.

## 2024-01-06 ENCOUNTER — Other Ambulatory Visit: Payer: Self-pay | Admitting: Family

## 2024-01-06 DIAGNOSIS — K219 Gastro-esophageal reflux disease without esophagitis: Secondary | ICD-10-CM

## 2024-01-14 ENCOUNTER — Other Ambulatory Visit: Payer: Self-pay | Admitting: Cardiology

## 2024-01-14 ENCOUNTER — Other Ambulatory Visit: Payer: Self-pay | Admitting: Family

## 2024-01-14 DIAGNOSIS — J4520 Mild intermittent asthma, uncomplicated: Secondary | ICD-10-CM

## 2024-01-14 DIAGNOSIS — I10 Essential (primary) hypertension: Secondary | ICD-10-CM

## 2024-01-19 ENCOUNTER — Telehealth: Payer: Self-pay

## 2024-01-19 NOTE — Telephone Encounter (Signed)
 Please verify dosage and frequency for Lactulose  then resend prescription as requested.

## 2024-01-19 NOTE — Telephone Encounter (Signed)
 Copied from CRM [id number]. Topic: Clinical - Prescription Issue >> Jan 19, 2024  8:48 AM Brooke Wilson wrote: Reason for CRM: Ptaient called states medication sent to pharmacy incorrectly lactulose  (CHRONULAC ) 10 GM/15ML solution States its supposed to be 4 5 per day for 30 days times 3 months 90 days supply omeprazole  (PRILOSEC) 40 MG capsule - 90 days   All medicine should be 90 days except narcotic and volume.   Thank You  Patient does has an appointment to see Brooke Roxan BROCKS, NP 02/22/2024

## 2024-01-20 MED ORDER — LACTULOSE 10 GM/15ML PO SOLN: 10.0000 g | Freq: Three times a day (TID) | ORAL | 1 refills | Status: DC

## 2024-01-20 NOTE — Telephone Encounter (Signed)
 Update medication list a requested then send refill.

## 2024-01-20 NOTE — Telephone Encounter (Signed)
 Spoke with patient and it is 15 ml and three times a day

## 2024-01-20 NOTE — Addendum Note (Signed)
 Addended byBETHA LEONARDA BURDOCK C on: 01/20/2024 04:42 PM   Modules accepted: Orders

## 2024-01-20 NOTE — Telephone Encounter (Signed)
 High Risk Warning Populated when attempting to refill, I will send to Provider for further review

## 2024-01-20 NOTE — Addendum Note (Signed)
 Addended by: RENELLA KEEN on: 01/20/2024 02:28 PM   Modules accepted: Orders

## 2024-01-20 NOTE — Addendum Note (Signed)
 Addended by: RENELLA KEEN on: 01/20/2024 02:30 PM   Modules accepted: Orders

## 2024-02-18 LAB — COMPREHENSIVE METABOLIC PANEL WITH GFR
ALT: 17 IU/L (ref 0–32)
AST: 19 IU/L (ref 0–40)
Albumin: 4.1 g/dL (ref 3.9–4.9)
Alkaline Phosphatase: 80 IU/L (ref 49–135)
BUN/Creatinine Ratio: 21 (ref 12–28)
BUN: 22 mg/dL (ref 8–27)
Bilirubin Total: 0.2 mg/dL (ref 0.0–1.2)
CO2: 20 mmol/L (ref 20–29)
Calcium: 9.2 mg/dL (ref 8.7–10.3)
Chloride: 109 mmol/L — ABNORMAL HIGH (ref 96–106)
Creatinine, Ser: 1.05 mg/dL — ABNORMAL HIGH (ref 0.57–1.00)
Globulin, Total: 2.6 g/dL (ref 1.5–4.5)
Glucose: 83 mg/dL (ref 70–99)
Potassium: 4.9 mmol/L (ref 3.5–5.2)
Sodium: 141 mmol/L (ref 134–144)
Total Protein: 6.7 g/dL (ref 6.0–8.5)
eGFR: 58 mL/min/1.73 — ABNORMAL LOW

## 2024-02-18 LAB — LIPID PANEL
Chol/HDL Ratio: 2.3 ratio (ref 0.0–4.4)
Cholesterol, Total: 135 mg/dL (ref 100–199)
HDL: 59 mg/dL
LDL Chol Calc (NIH): 57 mg/dL (ref 0–99)
Triglycerides: 103 mg/dL (ref 0–149)
VLDL Cholesterol Cal: 19 mg/dL (ref 5–40)

## 2024-02-18 LAB — APOLIPOPROTEIN B: Apolipoprotein B: 55 mg/dL

## 2024-02-18 LAB — HEMOGLOBIN A1C
Est. average glucose Bld gHb Est-mCnc: 117 mg/dL
Hgb A1c MFr Bld: 5.7 % — ABNORMAL HIGH (ref 4.8–5.6)

## 2024-02-19 ENCOUNTER — Other Ambulatory Visit: Payer: Self-pay | Admitting: Cardiology

## 2024-02-19 DIAGNOSIS — I10 Essential (primary) hypertension: Secondary | ICD-10-CM

## 2024-02-20 NOTE — Progress Notes (Signed)
 " Cardiology Office Note:    Date:  02/21/2024   ID:  Brooke Wilson, DOB [birth year], MRN [id number]  PCP:  Leonarda Roxan BROCKS, NP  Cardiologist:  Redell Leiter, MD    Referring MD: Leonarda Roxan BROCKS, NP    ASSESSMENT:    1. Essential hypertension   2. Mixed hyperlipidemia    PLAN:    In order of problems listed above:  Previously resistant hypertension now is very well-controlled continue her multidrug regimen including distal diuretic amiloride  carvedilol  centrally active clonidine  hydralazine  and Cardizem . LDL at target continue her high intensity statin   Next appointment: 1 year follow-up   Medication Adjustments/Labs and Tests Ordered: Current medicines are reviewed at length with the patient today.  Concerns regarding medicines are outlined above.  Orders Placed This Encounter  Procedures   EKG 12-Lead   No orders of the defined types were placed in this encounter.    History of Present Illness:    Brooke Wilson is a 69 y.o. 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   Class 1 obesity due to excess calories with serious comorbidity and body mass index (BMI) of 31.0 to 31.9 in adult 09/30/2016   Cough 03/23/2011   Dental disease 02/17/2012   [place] replaced in [place] 01/2012    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   Gastroesophageal reflux disease 05/17/2011   H/O varicella    History of arthroplasty of right knee 11/06/2019   History of measles, mumps, or rubella    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   Hyperlipemia 03/23/2011   Hypertension    Insomnia 05/17/2011   Mid back pain 02/17/2012   Neck pain 02/17/2012   OA (osteoarthritis) of knee 04/19/2019   Pain in joint of left knee 01/13/2020   Pelvic floor relaxation 09/30/2016   Perforated sigmoid colon (HCC) 12/01/2018   Prediabetes 04/21/2018   RAD (reactive airway disease) 02/17/2012   Resistant hypertension 03/23/2011   Sepsis (HCC) 12/01/2018   Therapeutic drug monitoring 09/21/2017    Current Medications: Active Medications[1]    EKGs/Labs/Other Studies Reviewed:    The following studies were reviewed today:  Cardiac Studies & Procedures   ______________________________________________________________________________________________   STRESS TESTS  MYOCARDIAL PERFUSION IMAGING 10/07/2016  Interpretation Summary  Nuclear stress EF: 64%.  There was no ST segment deviation noted during stress.  The study is normal.  The left ventricular ejection fraction is normal (55-65%).  1. EF 64% with normal wall motion. 2. No evidence for ischemia or infarction by perfusion images.  Normal study.  ______________________________________________________________________________________________      EKG Interpretation Date/Time:  Tuesday February 21 2024 08:02:46 EST Ventricular Rate:  78 PR Interval:  266 QRS Duration:  92 QT Interval:  410 QTC Calculation: 467 R  Axis:   -54  Text Interpretation: Sinus rhythm with 1st degree A-V block Left axis deviation Low voltage QRS When compared with ECG of 16-Dec-2022 14:19, Nonspecific T wave abnormality, improved in Confirmed by Monetta Rogue ([id number]) on 02/21/2024 8:17:27 AM   Recent Labs: 07/29/2023: Hemoglobin 11.4; Platelets 197; TSH 0.78 02/17/2024: ALT 17; BUN 22; Creatinine, Ser 1.05; Potassium 4.9; Sodium 141  Recent Lipid Panel    Component Value Date/Time   CHOL 135 02/17/2024 1410   TRIG 103 02/17/2024 1410   HDL 59 02/17/2024 1410   CHOLHDL 2.3 02/17/2024 1410   CHOLHDL 2.1 07/29/2023 1433   VLDL 35 (H) 02/17/2015 1956   LDLCALC 57 02/17/2024 1410   LDLCALC 55 07/29/2023 1433    Physical Exam:    VS:  BP 112/62   Pulse 78   Ht 4' 11 (1.499 m)   Wt 153 lb 6.4 oz (69.6 kg)   SpO2 98%   BMI 30.98 kg/m     Wt Readings from Last 3 Encounters:  02/21/24 153 lb 6.4 oz (69.6 kg)  07/29/23 150 lb 6.4 oz (68.2 kg)  12/16/22 164 lb (74.4 kg)     GEN:  Well nourished, well developed in no acute distress HEENT: Normal NECK: No JVD; No carotid bruits LYMPHATICS: No lymphadenopathy CARDIAC: RRR, no murmurs, rubs, gallops RESPIRATORY:  Clear to auscultation without rales, wheezing or rhonchi  ABDOMEN: Soft, non-tender, non-distended MUSCULOSKELETAL:  No edema; No deformity  SKIN: Warm and dry NEUROLOGIC:  Alert and oriented x 3 PSYCHIATRIC:  Normal affect    Signed, Rogue Monetta, MD  02/21/2024 8:35 AM     [facility]      [1]  Current Meds  Medication Sig   albuterol  (VENTOLIN  HFA) 108 (90 Base) MCG/ACT inhaler INHALE 2 PUFFS INTO THE LUNGS EVERY 6 HOURS AS NEEDED FOR WHEEZING OR SHORTNESS OF BREATH   aMILoride  (MIDAMOR ) 5 MG tablet Take 1 tablet (5 mg total) by mouth daily.   baclofen  (LIORESAL ) 10 MG tablet TAKE 1 TABLET BY MOUTH 3 TIMES A DAY AS NEEDED FOR MUSCLE RELAXANT FOR NECK   carvedilol  (COREG ) 12.5 MG tablet TAKE 1 TABLET BY MOUTH 2 TIMES DAILY.  (Patient taking differently: Take 25 mg by mouth 2 (two) times daily.)   cloNIDine  (CATAPRES ) 0.1 MG tablet Take 1 tablet (0.1 mg total) by mouth at bedtime. Patient needs appointment for further refills. 1 st attempt   diazepam  (VALIUM ) 10 MG tablet TAKE 1 TABLET BY MOUTH EVERY DAY AS NEEDED FOR MUSCLE SPASM   diltiazem  (CARDIZEM  CD) 120 MG 24 hr capsule Take 1 capsule (120 mg total) by mouth every morning.   diltiazem  (CARDIZEM  CD) 240 MG 24 hr capsule Take 1 capsule (240 mg total) by mouth at bedtime.   famotidine  (PEPCID ) 40 MG tablet TAKE 1 TABLET BY MOUTH TWICE A DAY   fluticasone  (FLONASE ) 50 MCG/ACT nasal spray Place 2 sprays into both nostrils daily. PLACE 2 SPRAYS IN THE NOSTRIL EVERY DAY   hydrALAZINE  (APRESOLINE ) 25 MG tablet TAKE 1 TABLET BY MOUTH THREE TIMES A DAY   hydrOXYzine  (ATARAX ) 50 MG tablet Take 1 tablet (50 mg total) by mouth 2 (two) times daily.   ibuprofen  (ADVIL ) 800 MG tablet TAKE 1 TABLET (800 MG TOTAL) BY MOUTH  IN THE MORNING, AT NOON, AND AT BEDTIME.   lactulose  (CHRONULAC ) 10 GM/15ML solution Take 15 mLs (10 g total) by mouth 3 (three) times daily.   montelukast  (SINGULAIR ) 10 MG tablet TAKE 1 TABLET BY MOUTH EVERYDAY AT BEDTIME   omeprazole  (PRILOSEC) 40 MG capsule TAKE 1 CAPSULE (40 MG TOTAL) BY MOUTH DAILY.   potassium chloride  (KLOR-CON ) 10 MEQ tablet Take 2 tablets (20 mEq total) by mouth daily.   rosuvastatin  (CRESTOR ) 40 MG tablet TAKE 1 TABLET BY MOUTH EVERY DAY   traMADol  (ULTRAM ) 50 MG tablet TAKE 1 TABLET BY MOUTH EVERY 12 HOURS AS NEEDED FOR MODERATE PAIN   zinc  sulfate 220 (50 Zn) MG capsule Take 1 capsule (220 mg total) by mouth daily.   "

## 2024-02-21 ENCOUNTER — Encounter: Payer: Self-pay | Admitting: Cardiology

## 2024-02-21 ENCOUNTER — Ambulatory Visit: Admitting: Family

## 2024-02-21 ENCOUNTER — Ambulatory Visit: Attending: Cardiology | Admitting: Cardiology

## 2024-02-21 ENCOUNTER — Other Ambulatory Visit: Payer: Self-pay

## 2024-02-21 ENCOUNTER — Encounter: Payer: Self-pay | Admitting: Family

## 2024-02-21 VITALS — BP 112/62 | HR 78 | Ht 59.0 in | Wt 153.4 lb

## 2024-02-21 VITALS — BP 126/88 | HR 80 | Temp 97.2°F | Resp 18 | Ht 59.0 in | Wt 150.4 lb

## 2024-02-21 DIAGNOSIS — K219 Gastro-esophageal reflux disease without esophagitis: Secondary | ICD-10-CM | POA: Diagnosis not present

## 2024-02-21 DIAGNOSIS — G8929 Other chronic pain: Secondary | ICD-10-CM

## 2024-02-21 DIAGNOSIS — I10 Essential (primary) hypertension: Secondary | ICD-10-CM

## 2024-02-21 DIAGNOSIS — E782 Mixed hyperlipidemia: Secondary | ICD-10-CM

## 2024-02-21 DIAGNOSIS — J4520 Mild intermittent asthma, uncomplicated: Secondary | ICD-10-CM

## 2024-02-21 DIAGNOSIS — M542 Cervicalgia: Secondary | ICD-10-CM | POA: Diagnosis not present

## 2024-02-21 DIAGNOSIS — R7303 Prediabetes: Secondary | ICD-10-CM | POA: Diagnosis not present

## 2024-02-21 DIAGNOSIS — J3089 Other allergic rhinitis: Secondary | ICD-10-CM

## 2024-02-21 MED ORDER — IBUPROFEN 800 MG PO TABS: 800.0000 mg | ORAL_TABLET | Freq: Three times a day (TID) | ORAL | 1 refills | Status: AC

## 2024-02-21 MED ORDER — OMEPRAZOLE 40 MG PO CPDR: 40.0000 mg | DELAYED_RELEASE_CAPSULE | Freq: Every day | ORAL | 1 refills | Status: AC

## 2024-02-21 MED ORDER — CLONIDINE HCL 0.1 MG PO TABS: 0.1000 mg | ORAL_TABLET | Freq: Every day | ORAL | 3 refills | Status: AC

## 2024-02-21 MED ORDER — DIAZEPAM 10 MG PO TABS: ORAL_TABLET | ORAL | 3 refills | Status: AC

## 2024-02-21 MED ORDER — HYDROXYZINE HCL 50 MG PO TABS: 50.0000 mg | ORAL_TABLET | Freq: Two times a day (BID) | ORAL | 1 refills | Status: AC

## 2024-02-21 MED ORDER — ZINC SULFATE 220 (50 ZN) MG PO CAPS: 220.0000 mg | ORAL_CAPSULE | Freq: Every day | ORAL | 1 refills | Status: AC

## 2024-02-21 MED ORDER — POTASSIUM CHLORIDE ER 10 MEQ PO TBCR: 20.0000 meq | EXTENDED_RELEASE_TABLET | Freq: Every day | ORAL | 3 refills | Status: AC

## 2024-02-21 MED ORDER — DILTIAZEM HCL ER COATED BEADS 240 MG PO CP24: 240.0000 mg | ORAL_CAPSULE | Freq: Every day | ORAL | 1 refills | Status: AC

## 2024-02-21 MED ORDER — LACTULOSE 10 GM/15ML PO SOLN: 30.0000 g | Freq: Three times a day (TID) | ORAL | 1 refills | Status: AC

## 2024-02-21 MED ORDER — TRAMADOL HCL 50 MG PO TABS: ORAL_TABLET | ORAL | 3 refills | Status: AC

## 2024-02-21 MED ORDER — BACLOFEN 10 MG PO TABS: ORAL_TABLET | ORAL | 0 refills | Status: AC

## 2024-02-21 MED ORDER — FLUTICASONE PROPIONATE 50 MCG/ACT NA SUSP: 2.0000 | Freq: Every day | NASAL | 3 refills | Status: AC

## 2024-02-21 MED ORDER — FAMOTIDINE 40 MG PO TABS: 40.0000 mg | ORAL_TABLET | Freq: Two times a day (BID) | ORAL | 0 refills | Status: AC

## 2024-02-21 MED ORDER — AMOXICILLIN-POT CLAVULANATE 875-125 MG PO TABS: 1.0000 | ORAL_TABLET | Freq: Two times a day (BID) | ORAL | 0 refills | Status: AC

## 2024-02-21 MED ORDER — MONTELUKAST SODIUM 10 MG PO TABS: 10.0000 mg | ORAL_TABLET | Freq: Every day | ORAL | 1 refills | Status: AC

## 2024-02-21 MED ORDER — ROSUVASTATIN CALCIUM 40 MG PO TABS: 40.0000 mg | ORAL_TABLET | Freq: Every day | ORAL | 1 refills | Status: AC

## 2024-02-21 MED ORDER — CARVEDILOL 12.5 MG PO TABS: 12.5000 mg | ORAL_TABLET | Freq: Two times a day (BID) | ORAL | 3 refills | Status: AC

## 2024-02-21 MED ORDER — PREDNISONE 10 MG (21) PO TBPK: ORAL_TABLET | ORAL | 0 refills | Status: AC

## 2024-02-21 MED ORDER — HYDRALAZINE HCL 25 MG PO TABS: 25.0000 mg | ORAL_TABLET | Freq: Three times a day (TID) | ORAL | 1 refills | Status: AC

## 2024-02-21 NOTE — Addendum Note (Signed)
 Addended by: SHERRE ADE I on: 02/21/2024 01:04 PM   Modules accepted: Orders

## 2024-02-21 NOTE — Patient Instructions (Addendum)
 Medication Instructions:  Your physician has recommended you make the following change in your medication:   START: Magnesium  400 mg daily OTC  *If you need a refill on your cardiac medications before your next appointment, please call your pharmacy*  Lab Work: Your physician recommends that you return for lab work in:   Labs 1 week before next visit: CBC, CMP, Lipids  If you have labs (blood work) drawn today and your tests are completely normal, you will receive your results only by: MyChart Message (if you have MyChart) OR A paper copy in the mail If you have any lab test that is abnormal or we need to change your treatment, we will call you to review the results.  Testing/Procedures: None  Follow-Up: At [facility], you and your health needs are our priority.  As part of our continuing mission to provide you with exceptional heart care, our providers are all part of one team.  This team includes your primary Cardiologist (physician) and Advanced Practice Providers or APPs (Physician Assistants and Nurse Practitioners) who all work together to provide you with the care you need, when you need it.  Your next appointment:   6 months  Provider:   Redell Leiter, MD    We recommend signing up for the patient portal called MyChart.  Sign up information is provided on this After Visit Summary.  MyChart is used to connect with patients for Virtual Visits (Telemedicine).  Patients are able to view lab/test results, encounter notes, upcoming appointments, etc.  Non-urgent messages can be sent to your provider as well.   To learn more about what you can do with MyChart, go to [email or web address].   Other Instructions None

## 2024-02-22 ENCOUNTER — Ambulatory Visit: Admitting: Family

## 2024-02-26 NOTE — Progress Notes (Signed)
 "  Provider: Ulyess Muto FNP-C   Roddie Riegler, Roxan BROCKS, NP  Patient Care Team: Winfrey Chillemi, Roxan BROCKS, NP as PCP - General (Family Medicine) Monetta Redell PARAS, MD as Referring Physician (Cardiology) Betsey Channel, MD as Consulting Physician (Nephrology)  Extended Emergency Contact Information Primary Emergency Contact: Gloria,Sotero Address: [address]          [place] [address] [place] Home Phone: [phone] Mobile Phone: [phone] Relation: Spouse Secondary Emergency Contact: Gopal,Sagi Mobile Phone: [phone] Relation: Daughter  Code Status:  Full Code  Goals of care: Advanced Directive information    07/29/2023    1:44 PM  Advanced Directives  Does Patient Have a Medical Advance Directive? Yes  Type of Estate Agent of [place];Living will  Does patient want to make changes to medical advance directive? No - Patient declined  Copy of Healthcare Power of Attorney in Chart? No - copy requested     Chief Complaint  Patient presents with   Medical Management of Chronic Issues    6 Month follow up.     History of Present Illness   Yousra A Gleed is a 69 year old female who presents for a six-month follow-up and medication refill.  She is experiencing issues with her medication prescriptions, receiving only a one-month supply instead of the intended ninety-day supply. This has been inconvenient as she has returned to work and finds it challenging to frequently contact the pharmacy.  She is currently taking several medications including baclofen  10 mg, lactulose  60 mL twice a day, zinc  220 mg, diazepam , tramadol , and ibuprofen  800 mg. Lactulose  is effective for her bowel movements, and she emphasizes the importance of having a sufficient supply due to previous hospitalization for bowel problems. She also takes magnesium  over the counter for muscle spasms, which she attributes to her knee surgery.  She has a history of  knee replacement surgery and experiences muscle spasms, which she believes are related to the surgery. She uses ibuprofen  more frequently than tramadol  for pain management, particularly due to her work demands, which involve standing for long periods.  She mentions a sore throat and nasal drip, suspecting it to be her usual respiratory issue, and requests amoxicillin  and prednisone  as she has been prescribed in the past for similar symptoms.  Her recent lab work includes total cholesterol at 135, triglycerides at 103, HDL at 59, LDL at 57, and glucose at 83. Her A1c is currently 5.7, down from 5.8, and she is monitoring her diet.  She discusses her vaccination status, stating that she has completed her shingles and COVID vaccinations, although records need to be updated. She mentions her daughter is in the hospital, expecting a baby, and she is preparing to travel to the [place].    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   Class 1 obesity due to excess calories with serious comorbidity and body mass index (BMI) of 31.0 to 31.9 in adult 09/30/2016   Cough 03/23/2011   Dental disease 02/17/2012   [place] replaced in [place] 01/2012    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   Gastroesophageal reflux disease 05/17/2011  H/O varicella    History of arthroplasty of right knee 11/06/2019   History of measles, mumps, or rubella    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   Hyperlipemia 03/23/2011   Hypertension    Insomnia 05/17/2011   Mid back pain 02/17/2012   Neck pain 02/17/2012   OA (osteoarthritis) of knee  04/19/2019   Pain in joint of left knee 01/13/2020   Pelvic floor relaxation 09/30/2016   Perforated sigmoid colon (HCC) 12/01/2018   Prediabetes 04/21/2018   RAD (reactive airway disease) 02/17/2012   Resistant hypertension 03/23/2011   Sepsis (HCC) 12/01/2018   Therapeutic drug monitoring 09/21/2017   Past Surgical History:  Procedure Laterality Date   REPLACEMENT TOTAL KNEE Right    TOTAL KNEE ARTHROPLASTY Left 01/07/2020   Procedure: TOTAL KNEE ARTHROPLASTY;  Surgeon: Melodi Lerner, MD;  Location: [facility];  Service: Orthopedics;  Laterality: Left;    TUBAL LIGATION     VAGINAL HYSTERECTOMY     uterine prolapse; DUB; ovaries intact.  [place]    Allergies[1]  Allergies as of 02/21/2024       Reactions   Ace Inhibitors Cough   Iodinated Contrast Media Hives   Chills   Lisinopril Cough   Sulfa Antibiotics Hives   Iodine  Hives   Red Dye #40 (allura Red)    Tape    Surgical tape causes itching   Yellow Dye #6 (sunset Yellow)    Bactrim Rash   Flexeril [cyclobenzaprine Hcl] Nausea Only   Latex Itching   Naprosyn [naproxen] Nausea And Vomiting   And high doses of NSAIDS   Sulfamethoxazole-trimethoprim Rash        Medication List        Accurate as of February 21, 2024 11:59 PM. If you have any questions, ask your nurse or doctor.          albuterol  108 (90 Base) MCG/ACT inhaler Commonly known as: VENTOLIN  HFA INHALE 2 PUFFS INTO THE LUNGS EVERY 6 HOURS AS NEEDED FOR WHEEZING OR SHORTNESS OF BREATH   aMILoride  5 MG tablet Commonly known as: MIDAMOR  Take 1 tablet (5 mg total) by mouth daily.   amoxicillin -clavulanate 875-125 MG tablet Commonly known as: AUGMENTIN  Take 1 tablet by mouth 2 (two) times daily.   baclofen  10 MG tablet Commonly known as: LIORESAL  TAKE 1 TABLET BY MOUTH 3 TIMES A DAY AS NEEDED FOR MUSCLE RELAXANT FOR NECK   carvedilol  12.5 MG tablet Commonly known as: COREG  Take 1 tablet (12.5 mg total) by mouth 2 (two) times daily. What  changed: how much to take   cloNIDine  0.1 MG tablet Commonly known as: CATAPRES  Take 1 tablet (0.1 mg total) by mouth at bedtime. What changed: additional instructions Changed by: Redell Leiter, MD   diazepam  10 MG tablet Commonly known as: VALIUM  TAKE 1 TABLET BY MOUTH EVERY DAY AS NEEDED FOR MUSCLE SPASM   diltiazem  120 MG 24 hr capsule Commonly known as: CARDIZEM  CD Take 1 capsule (120 mg total) by mouth every morning.   diltiazem  240 MG 24 hr capsule Commonly known as: CARDIZEM  CD Take 1 capsule (240 mg total) by mouth at bedtime.   famotidine  40 MG tablet Commonly known as: PEPCID  Take 1 tablet (40 mg total) by mouth 2 (two) times daily.   fluticasone  50 MCG/ACT nasal spray Commonly known as: FLONASE  Place 2 sprays into both nostrils daily. PLACE 2 SPRAYS IN THE NOSTRIL EVERY DAY   hydrALAZINE  25 MG tablet Commonly known as: APRESOLINE  Take 1  tablet (25 mg total) by mouth 3 (three) times daily.   hydrOXYzine  50 MG tablet Commonly known as: ATARAX  Take 1 tablet (50 mg total) by mouth 2 (two) times daily.   ibuprofen  800 MG tablet Commonly known as: ADVIL  Take 1 tablet (800 mg total) by mouth in the morning, at noon, and at bedtime.   lactulose  10 GM/15ML solution Commonly known as: CHRONULAC  Take 45 mLs (30 g total) by mouth 3 (three) times daily. What changed: how much to take Changed by: Ian Castagna, NP   magnesium  oxide 400 (240 Mg) MG tablet Commonly known as: MAG-OX Take 400 mg by mouth daily.   montelukast  10 MG tablet Commonly known as: SINGULAIR  Take 1 tablet (10 mg total) by mouth at bedtime. What changed: See the new instructions. Changed by: Roxan Plough, NP   omeprazole  40 MG capsule Commonly known as: PRILOSEC Take 1 capsule (40 mg total) by mouth daily.   potassium chloride  10 MEQ tablet Commonly known as: KLOR-CON  Take 2 tablets (20 mEq total) by mouth daily.   predniSONE  10 MG (21) Tbpk tablet Commonly known as: STERAPRED UNI-PAK  21 TAB As directed Started by: Chasiti Waddington, NP   rosuvastatin  40 MG tablet Commonly known as: CRESTOR  Take 1 tablet (40 mg total) by mouth daily.   traMADol  50 MG tablet Commonly known as: ULTRAM  TAKE 1 TABLET BY MOUTH EVERY 12 HOURS AS NEEDED FOR MODERATE PAIN   zinc  sulfate (50mg  elemental zinc ) 220 (50 Zn) MG capsule Take 1 capsule (220 mg total) by mouth daily.        Review of Systems  Constitutional:  Negative for appetite change, chills, fatigue, fever and unexpected weight change.  HENT:  Negative for congestion, dental problem, ear discharge, ear pain, facial swelling, hearing loss, nosebleeds, postnasal drip, rhinorrhea, sinus pressure, sinus pain, sneezing, sore throat, tinnitus and trouble swallowing.   Eyes:  Negative for pain, discharge, redness, itching and visual disturbance.  Respiratory:  Negative for cough, chest tightness, shortness of breath and wheezing.   Cardiovascular:  Negative for chest pain, palpitations and leg swelling.  Gastrointestinal:  Negative for abdominal distention, abdominal pain, blood in stool, constipation, diarrhea, nausea and vomiting.  Endocrine: Negative for cold intolerance, heat intolerance, polydipsia, polyphagia and polyuria.  Genitourinary:  Negative for difficulty urinating, dysuria, flank pain, frequency and urgency.  Musculoskeletal:  Negative for arthralgias, back pain, gait problem, joint swelling, myalgias, neck pain and neck stiffness.  Skin:  Negative for color change, pallor, rash and wound.  Neurological:  Negative for dizziness, syncope, speech difficulty, weakness, light-headedness, numbness and headaches.  Hematological:  Does not bruise/bleed easily.  Psychiatric/Behavioral:  Negative for agitation, behavioral problems, confusion, hallucinations, self-injury, sleep disturbance and suicidal ideas. The patient is not nervous/anxious.     Immunization History  Administered Date(s) Administered   INFLUENZA, HIGH DOSE  SEASONAL PF 11/30/2022   Influenza,inj,Quad PF,6+ Mos 12/14/2018, 10/18/2019   Influenza,inj,quad, With Preservative 11/16/2014, 11/16/2018   Influenza-Unspecified 11/13/2014, 12/17/2015, 12/16/2016   Moderna Sars-Covid-2 Vaccination 02/26/2019, 03/24/2019, 01/03/2020, 07/05/2020   PFIZER(Purple Top)SARS-COV-2 Vaccination 11/19/2020   PNEUMOCOCCAL CONJUGATE-20 02/02/2021   Td 12/17/2006   Zoster Recombinant(Shingrix ) 02/02/2021   Pertinent  Health Maintenance Due  Topic Date Due   Mammogram  02/03/2025   Colonoscopy  03/29/2029   Influenza Vaccine  Completed   Bone Density Scan  Completed      09/29/2020    8:24 AM 11/27/2020   10:42 AM 12/25/2020   10:05 AM 09/23/2021    9:04  AM 07/29/2023    1:44 PM  Fall Risk  Falls in the past year? 0 0 0 0 0  Was there an injury with Fall? 0  0  0  0  0   Fall Risk Category Calculator 0 0 0 0 0  Fall Risk Category (Retired) Low  Low  Low  Low    (RETIRED) Patient Fall Risk Level Low fall risk  Low fall risk  Low fall risk  Low fall risk    Patient at Risk for Falls Due to No Fall Risks No Fall Risks No Fall Risks No Fall Risks No Fall Risks  Fall risk Follow up Falls evaluation completed  Falls evaluation completed  Falls evaluation completed  Falls evaluation completed  Falls evaluation completed     Data saved with a previous flowsheet row definition   Functional Status Survey:    Vitals:   02/21/24 1249  BP: 126/88  Pulse: 80  Resp: 18  Temp: (!) 97.2 F (36.2 C)  SpO2: 94%  Weight: 150 lb 6.4 oz (68.2 kg)  Height: 4' 11 (1.499 m)   Body mass index is 30.38 kg/m. Physical Exam  VITALS: T- 97.2, P- 88, BP- 126/88, SaO2- 94% MEASUREMENTS: Weight- 150. GENERAL: Alert, cooperative, well developed, no acute distress. HEENT: Normocephalic, normal oropharynx, moist mucous membranes, tympanic membranes normal bilaterally, nasal passages clear. NECK: Thyroid  normal. CHEST: Clear to auscultation bilaterally, no wheezes, rhonchi,  or crackles. CARDIOVASCULAR: Normal heart rate and rhythm, S1 and S2 normal without murmurs. ABDOMEN: Soft, non-tender, non-distended, without organomegaly, normal bowel sounds. EXTREMITIES: No cyanosis or edema. NEUROLOGICAL: Cranial nerves grossly intact, moves all extremities without gross motor or sensory deficit.  SKIN: No rash,no lesion or erythema   PSYCHIATRY/BEHAVIORAL: Mood stable   Labs reviewed: Recent Labs    07/29/23 1433 02/17/24 1410  NA 142 141  K 4.2 4.9  CL 110 109*  CO2 24 20  GLUCOSE 131* 83  BUN 30* 22  CREATININE 0.83 1.05*  CALCIUM  8.8 9.2   Recent Labs    07/29/23 1433 02/17/24 1410  AST 16 19  ALT 14 17  ALKPHOS  --  80  BILITOT 0.2 0.2  PROT 6.3 6.7  ALBUMIN  --  4.1   Recent Labs    07/29/23 1433  WBC 5.4  NEUTROABS 2,560  HGB 11.4*  HCT 37.3  MCV 86.9  PLT 197   Lab Results  Component Value Date   TSH 0.78 07/29/2023   Lab Results  Component Value Date   HGBA1C 5.7 (H) 02/17/2024   Lab Results  Component Value Date   CHOL 135 02/17/2024   HDL 59 02/17/2024   LDLCALC 57 02/17/2024   TRIG 103 02/17/2024   CHOLHDL 2.3 02/17/2024    Significant Diagnostic Results in last 30 days:  No results found.  Assessment/Plan  Medication management for chronic conditions Issues with prescription refills, particularly for lactulose , baclofen , and diazepam . Lactulose  is effective for bowel movements, taken as 60 mL daily in two doses. Baclofen  is used for muscle spasms. Diazepam  is used for relaxation, especially at night. Tramadol  is used for pain management, primarily for knee pain. Ibuprofen  is used more frequently than tramadol  for pain relief. - Refilled lactulose  prescription for a three-month supply with a larger bottle. - Refilled baclofen  prescription. - Refilled tramadol  prescription. - Refilled ibuprofen  prescription.  Chronic neck pain Possibly related to arthritis. Diazepam  is used for relaxation and muscle relief. She  also receives massages for muscle relaxation. -  Continue diazepam  for muscle relaxation. - Continue massage therapy for muscle relief.  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 Pain is managed with tramadol  and ibuprofen . She experiences difficulty with stairs but manages well on flat surfaces. Uses a rail for support on stairs. - Continue tramadol  for pain management. - Continue ibuprofen  for pain management. - Use rail for support on stairs.  Constipation Chronic constipation managed with lactulose . She finds lactulose  effective and prefers it over other medications. Takes 60 mL daily in two doses. - Continue lactulose  60 mL daily in two doses.  Essential hypertension Blood pressure is well-controlled at 126/88 mmHg. Carvedilol  is managed by cardiologist Dr. Dorise.  Mixed hyperlipidemia Lipid panel shows total cholesterol at 135 mg/dL, triglycerides at 896 mg/dL, HDL at 59 mg/dL, and LDL at 57 mg/dL. Triglycerides have increased slightly from previous levels. - Continue current lipid management regimen.  Prediabetes A1c has decreased from 5.9% to 5.7%. Glucose levels are well-controlled at 83 mg/dL. She is advised to continue dietary management to maintain glucose levels. - Continue dietary management to maintain glucose levels. - Ordered A1c test for six-month follow-up.  Non-seasonal allergic rhinitis Symptoms include sore throat and nasal drip.  General health maintenance She is due for a mammogram today. Pap smear is not needed as she is aged out. Bone density is up to date. Shingles and COVID vaccines are up to date. Tetanus is current. - Complete mammogram today. - Ensure shingles and COVID vaccine records are updated. - Continue routine health maintenance.   Family/ staff Communication: Reviewed plan of care with patient verbalized understanding   Labs/tests ordered: Hgb A1C   Next Appointment : Return in about 6  months (around 08/20/2024) for medical mangement of chronic issues.Fasting labs at [facility] prior to visit .  Spent 30 minutes of Face to face and non-face to face with patient  >50% time spent counseling; reviewing medical record; tests; labs; documentation and developing future plan of care.   Dann Ventress C Jasleen Riepe, NP      [1]  Allergies Allergen Reactions   Ace Inhibitors Cough   Iodinated Contrast Media Hives    Chills   Lisinopril Cough   Sulfa Antibiotics Hives   Iodine  Hives   Red Dye #40 (Allura Red)    Tape     Surgical tape causes itching   Yellow Dye #6 (Sunset Yellow)    Bactrim Rash   Flexeril [Cyclobenzaprine Hcl] Nausea Only   Latex Itching   Naprosyn [Naproxen] Nausea And Vomiting    And high doses of NSAIDS   Sulfamethoxazole-Trimethoprim Rash   "

## 2024-03-13 ENCOUNTER — Telehealth: Payer: Self-pay | Admitting: Cardiology

## 2024-03-13 ENCOUNTER — Other Ambulatory Visit: Payer: Self-pay

## 2024-03-13 DIAGNOSIS — I10 Essential (primary) hypertension: Secondary | ICD-10-CM

## 2024-03-13 MED ORDER — AMILORIDE HCL 5 MG PO TABS: 5.0000 mg | ORAL_TABLET | Freq: Every day | ORAL | 3 refills | Status: AC

## 2024-03-13 MED ORDER — DILTIAZEM HCL ER COATED BEADS 120 MG PO CP24: 120.0000 mg | ORAL_CAPSULE | Freq: Every morning | ORAL | 3 refills | Status: AC

## 2024-03-13 NOTE — Telephone Encounter (Signed)
 Called the patient and verified that she should be taking both of these medications. New prescriptions for Diltiazem  120 mg and Amiloride  5 mg ordered via Epic and sent to the patient's pharmacy.Patient verbalized understanding and had no further questions at this time.

## 2024-03-13 NOTE — Telephone Encounter (Signed)
" ° °*  STAT* If patient is at the pharmacy, call can be transferred to refill team.   1. Which medications need to be refilled? (please list name of each medication and dose if known)   diltiazem  (CARDIZEM  CD) 120 MG 24 hr capsule   aMILoride  (MIDAMOR ) 5 MG tablet   2. Would you like to learn more about the convenience, safety, & potential cost savings by using the [facility]? No      3. Are you open to using the [facility] (Type [facility]. ). No    4. Which pharmacy/location (including street and city if local pharmacy) is medication to be sent to? [id number] - [place], [place] - [address]     5. Do they need a 30 day or 90 day supply? 90 days   "

## 2024-03-13 NOTE — Telephone Encounter (Signed)
 Refill sent

## 2024-08-20 ENCOUNTER — Ambulatory Visit: Admitting: Family
